# Patient Record
Sex: Male | Born: 1950 | Race: Black or African American | Hispanic: No | Marital: Married | Smoking: Never smoker
Health system: Southern US, Community
[De-identification: ages and names within clinical notes are randomized; demographics above are authoritative.]

## PROBLEM LIST (undated history)

## (undated) DIAGNOSIS — M1990 Unspecified osteoarthritis, unspecified site: Secondary | ICD-10-CM

## (undated) DIAGNOSIS — S83242A Other tear of medial meniscus, current injury, left knee, initial encounter: Secondary | ICD-10-CM

## (undated) DIAGNOSIS — I10 Essential (primary) hypertension: Secondary | ICD-10-CM

## (undated) DIAGNOSIS — K219 Gastro-esophageal reflux disease without esophagitis: Secondary | ICD-10-CM

## (undated) HISTORY — PX: COLONOSCOPY: SHX174

## (undated) HISTORY — PX: HIP SURGERY: SHX245

---

## 2008-12-14 ENCOUNTER — Emergency Department (HOSPITAL_COMMUNITY): Admission: EM | Admit: 2008-12-14 | Discharge: 2008-12-14 | Payer: Self-pay | Admitting: Emergency Medicine

## 2010-05-08 LAB — POCT I-STAT, CHEM 8
BUN: 13 mg/dL (ref 6–23)
Calcium, Ion: 1.18 mmol/L (ref 1.12–1.32)
Chloride: 101 mEq/L (ref 96–112)
Creatinine, Ser: 1.1 mg/dL (ref 0.4–1.5)
Glucose, Bld: 157 mg/dL — ABNORMAL HIGH (ref 70–99)
HCT: 45 % (ref 39.0–52.0)
Hemoglobin: 15.3 g/dL (ref 13.0–17.0)
Potassium: 3.9 mEq/L (ref 3.5–5.1)
Sodium: 140 mEq/L (ref 135–145)
TCO2: 28 mmol/L (ref 0–100)

## 2010-05-08 LAB — POCT CARDIAC MARKERS
CKMB, poc: 1.2 ng/mL (ref 1.0–8.0)
Myoglobin, poc: 231 ng/mL (ref 12–200)
Troponin i, poc: <0.05 ng/mL (ref 0.00–0.09)

## 2011-06-20 ENCOUNTER — Other Ambulatory Visit (HOSPITAL_COMMUNITY): Payer: Self-pay | Admitting: Gastroenterology

## 2011-06-20 ENCOUNTER — Encounter (HOSPITAL_COMMUNITY): Payer: Self-pay

## 2011-06-20 ENCOUNTER — Ambulatory Visit (HOSPITAL_COMMUNITY)
Admission: RE | Admit: 2011-06-20 | Discharge: 2011-06-20 | Disposition: A | Discharge status: Home / Self Care | Payer: BC Managed Care – PPO | Source: Ambulatory Visit | Attending: Gastroenterology | Admitting: Gastroenterology

## 2011-06-20 ENCOUNTER — Encounter (HOSPITAL_COMMUNITY): Payer: Self-pay | Admitting: Emergency Medicine

## 2011-06-20 ENCOUNTER — Inpatient Hospital Stay (HOSPITAL_COMMUNITY)
Admission: EM | Admit: 2011-06-20 | Discharge: 2011-06-25 | DRG: 183 | Disposition: A | Discharge status: Home / Self Care | Payer: BC Managed Care – PPO | Attending: Surgery | Admitting: Surgery

## 2011-06-20 DIAGNOSIS — R112 Nausea with vomiting, unspecified: Secondary | ICD-10-CM | POA: Diagnosis present

## 2011-06-20 DIAGNOSIS — K5730 Diverticulosis of large intestine without perforation or abscess without bleeding: Secondary | ICD-10-CM | POA: Insufficient documentation

## 2011-06-20 DIAGNOSIS — K5720 Diverticulitis of large intestine with perforation and abscess without bleeding: Secondary | ICD-10-CM | POA: Diagnosis present

## 2011-06-20 DIAGNOSIS — Z888 Allergy status to other drugs, medicaments and biological substances status: Secondary | ICD-10-CM

## 2011-06-20 DIAGNOSIS — K5792 Diverticulitis of intestine, part unspecified, without perforation or abscess without bleeding: Secondary | ICD-10-CM

## 2011-06-20 DIAGNOSIS — K5732 Diverticulitis of large intestine without perforation or abscess without bleeding: Principal | ICD-10-CM | POA: Diagnosis present

## 2011-06-20 LAB — BASIC METABOLIC PANEL
BUN: 13 mg/dL (ref 6–23)
CO2: 25 mEq/L (ref 19–32)
Calcium: 9.6 mg/dL (ref 8.4–10.5)
Chloride: 102 mEq/L (ref 96–112)
Creatinine, Ser: 1.07 mg/dL (ref 0.50–1.35)
GFR calc Af Amer: 87 mL/min — ABNORMAL LOW (ref >90)
GFR calc non Af Amer: 75 mL/min — ABNORMAL LOW (ref >90)
Glucose, Bld: 126 mg/dL — ABNORMAL HIGH (ref 70–99)
Potassium: 3.7 mEq/L (ref 3.5–5.1)
Sodium: 139 mEq/L (ref 135–145)

## 2011-06-20 LAB — CBC
HCT: 41.4 % (ref 39.0–52.0)
Hemoglobin: 14.5 g/dL (ref 13.0–17.0)
MCH: 32.9 pg (ref 26.0–34.0)
MCHC: 35 g/dL (ref 30.0–36.0)
MCV: 93.9 fL (ref 78.0–100.0)
Platelets: 183 10*3/uL (ref 150–400)
RBC: 4.41 MIL/uL (ref 4.22–5.81)
RDW: 12.8 % (ref 11.5–15.5)
WBC: 12.8 10*3/uL — ABNORMAL HIGH (ref 4.0–10.5)

## 2011-06-20 LAB — DIFFERENTIAL
Basophils Absolute: 0 10*3/uL (ref 0.0–0.1)
Basophils Relative: 0 % (ref 0–1)
Eosinophils Absolute: 0 10*3/uL (ref 0.0–0.7)
Eosinophils Relative: 0 % (ref 0–5)
Lymphocytes Relative: 13 % (ref 12–46)
Lymphs Abs: 1.6 10*3/uL (ref 0.7–4.0)
Monocytes Absolute: 0.9 10*3/uL (ref 0.1–1.0)
Monocytes Relative: 7 % (ref 3–12)
Neutro Abs: 10.3 10*3/uL — ABNORMAL HIGH (ref 1.7–7.7)
Neutrophils Relative %: 81 % — ABNORMAL HIGH (ref 43–77)

## 2011-06-20 LAB — MRSA PCR SCREENING: MRSA by PCR: NEGATIVE

## 2011-06-20 MED ORDER — METRONIDAZOLE IN NACL 5-0.79 MG/ML-% IV SOLN
500.0000 mg | Freq: Three times a day (TID) | INTRAVENOUS | Status: DC
  Administered 2011-06-20 – 2011-06-23 (×8): 500 mg via INTRAVENOUS
  Filled 2011-06-20 (×12): qty 100

## 2011-06-20 MED ORDER — IOHEXOL 300 MG/ML  SOLN
100.0000 mL | Freq: Once | INTRAMUSCULAR | Status: AC | PRN
  Administered 2011-06-20: 100 mL via INTRAVENOUS

## 2011-06-20 MED ORDER — PIPERACILLIN-TAZOBACTAM 3.375 G IVPB
3.3750 g | Freq: Three times a day (TID) | INTRAVENOUS | Status: DC
  Administered 2011-06-20 – 2011-06-24 (×11): 3.375 g via INTRAVENOUS
  Filled 2011-06-20 (×14): qty 50

## 2011-06-20 MED ORDER — METRONIDAZOLE IN NACL 5-0.79 MG/ML-% IV SOLN: 500.0000 mg | Freq: Three times a day (TID) | INTRAVENOUS | Status: DC

## 2011-06-20 MED ORDER — PANTOPRAZOLE SODIUM 40 MG IV SOLR
40.0000 mg | Freq: Every day | INTRAVENOUS | Status: DC
  Administered 2011-06-21 – 2011-06-24 (×5): 40 mg via INTRAVENOUS
  Filled 2011-06-20 (×7): qty 40

## 2011-06-20 MED ORDER — KCL IN DEXTROSE-NACL 20-5-0.9 MEQ/L-%-% IV SOLN
INTRAVENOUS | Status: DC
  Administered 2011-06-20 – 2011-06-25 (×9): via INTRAVENOUS
  Filled 2011-06-20 (×13): qty 1000

## 2011-06-20 MED ORDER — ONDANSETRON HCL 4 MG/2ML IJ SOLN: 4.0000 mg | Freq: Four times a day (QID) | INTRAMUSCULAR | Status: DC | PRN

## 2011-06-20 MED ORDER — MORPHINE SULFATE 2 MG/ML IJ SOLN: 2.0000 mg | INTRAMUSCULAR | Status: DC | PRN

## 2011-06-20 NOTE — ED Notes (Signed)
Pt c/o abdominal pain since last night after dinner.  HX of diverticulitis for 1 year.  Had outpatient CT today, which showed perforation.  Dr. Carolynne Edouard at bedside to admit pt for observation at this time.

## 2011-06-20 NOTE — ED Notes (Signed)
6-8 weeks having sharp pains in abd; last night went to dinner and later, having sharp pains right below belly button; had CT scan done- reports said that had diverticulitis; here to see Dr Carolynne Edouard

## 2011-06-20 NOTE — ED Notes (Signed)
Dr. Carolynne Edouard with pt in triage prior to going back to Pod A Room 11 (now).

## 2011-06-20 NOTE — ED Notes (Signed)
Informed Dr. Carolynne Edouard that pt. Arrived.  Orders received and he will be down to see pt.

## 2011-06-20 NOTE — H&P (Signed)
Matthew Yates is an 61 y.o. male.   Chief Complaint: abd pain HPI: 61 yo bm who presents with abd pain that started yesterday. Pain is worse in LLQ. He reports fever and weakness. He has had pains like this in the past but never quite this bad. No nausea or vomiting. He went to GI doc who got CT today that shows sigmoid diverticulitis with a couple dots of free air but no abscess.  History reviewed. No pertinent past medical history.  Past Surgical History  Procedure Date  . Hip surgery     History reviewed. No pertinent family history. Social History:  reports that he has never smoked. He does not have any smokeless tobacco history on file. He reports that he does not drink alcohol or use illicit drugs.  Allergies:  Allergies  Allergen Reactions  . Other Other (See Comments)    Pt states that a certain anesthesia for dental work caused his HR to lower drastically. Cannot remember name but understands that it is not a commonly used choice (i.e., novocain).      (Not in a hospital admission)  Results for orders placed during the hospital encounter of 06/20/11 (from the past 48 hour(s))  CBC     Status: Abnormal   Collection Time   06/20/11  6:33 PM      Component Value Range Comment   WBC 12.8 (*) 4.0 - 10.5 (K/uL)    RBC 4.41  4.22 - 5.81 (MIL/uL)    Hemoglobin 14.5  13.0 - 17.0 (g/dL)    HCT 21.3  08.6 - 57.8 (%)    MCV 93.9  78.0 - 100.0 (fL)    MCH 32.9  26.0 - 34.0 (pg)    MCHC 35.0  30.0 - 36.0 (g/dL)    RDW 46.9  62.9 - 52.8 (%)    Platelets 183  150 - 400 (K/uL)   DIFFERENTIAL     Status: Abnormal   Collection Time   06/20/11  6:33 PM      Component Value Range Comment   Neutrophils Relative 81 (*) 43 - 77 (%)    Neutro Abs 10.3 (*) 1.7 - 7.7 (K/uL)    Lymphocytes Relative 13  12 - 46 (%)    Lymphs Abs 1.6  0.7 - 4.0 (K/uL)    Monocytes Relative 7  3 - 12 (%)    Monocytes Absolute 0.9  0.1 - 1.0 (K/uL)    Eosinophils Relative 0  0 - 5 (%)    Eosinophils  Absolute 0.0  0.0 - 0.7 (K/uL)    Basophils Relative 0  0 - 1 (%)    Basophils Absolute 0.0  0.0 - 0.1 (K/uL)   BASIC METABOLIC PANEL     Status: Abnormal   Collection Time   06/20/11  6:33 PM      Component Value Range Comment   Sodium 139  135 - 145 (mEq/L)    Potassium 3.7  3.5 - 5.1 (mEq/L)    Chloride 102  96 - 112 (mEq/L)    CO2 25  19 - 32 (mEq/L)    Glucose, Bld 126 (*) 70 - 99 (mg/dL)    BUN 13  6 - 23 (mg/dL)    Creatinine, Ser 4.13  0.50 - 1.35 (mg/dL)    Calcium 9.6  8.4 - 10.5 (mg/dL)    GFR calc non Af Amer 75 (*) >90 (mL/min)    GFR calc Af Amer 87 (*) >90 (mL/min)  Ct Abdomen Pelvis W Contrast  06/20/2011  **ADDENDUM** CREATED: 06/20/2011 16:22:22  These results were called by telephone on 06/20/2011  at  1623 hours to  Dr. Herbert Moors, who verbally acknowledged these results.  **END ADDENDUM** SIGNED BY: Charline Bills, M.D.   06/20/2011  *RADIOLOGY REPORT*  Clinical Data: Chronic recurrent lower abdominal pain, nausea/vomiting  CT ABDOMEN AND PELVIS WITH CONTRAST  Technique:  Multidetector CT imaging of the abdomen and pelvis was performed following the standard protocol during bolus administration of intravenous contrast.  Contrast: OMNIPAQUE IOHEXOL 300 MG/ML  SOLN  Comparison: None.  Findings: Subpleural reticulation/dependent atelectasis at the lung bases.  Liver, spleen, pancreas, and adrenal glands are within normal limits.  Gallbladder is unremarkable.  No intrahepatic or extrahepatic ductal dilatation.  Kidneys are within normal limits.  No hydronephrosis.  No evidence of bowel obstruction.  Normal appendix.  Inflammatory changes involving a loop of sigmoid colon (series 2/image 76), reflecting sigmoid diverticulitis.  Multiple tiny foci of localized gas (for example, series 2/image 74) as well as free air (for example, series 2/images 11 and 20), reflecting perforation.  Adjacent 2.0 x 1.4 cm thin-walled pericolonic gas collection is favored to reflect either  the inflamed diverticulum or a localized perforation.  Regardless, currently there is no drainable fluid collection or abscess.  No evidence of abdominal aortic aneurysm.  No abdominopelvic ascites.  No suspicious abdominopelvic lymphadenopathy.  Prostate is unremarkable.  Bladder is within normal limits.  Moderate fat-containing periumbilical hernia.  Mild degenerative changes of the visualized thoracolumbar spine.  IMPRESSION: Complicated sigmoid diverticulitis with scattered small foci of localized gas and free air, as described above, reflecting perforation.  Currently, there is no drainable fluid collection or abscess.  If surgical intervention is not performed, close clinical follow-up is suggested with repeat imaging as clinically warranted.  Original Report Authenticated By: Charline Bills, M.D.    Review of Systems  Constitutional: Positive for fever.  HENT: Negative.   Eyes: Negative.   Respiratory: Negative.   Cardiovascular: Negative.   Gastrointestinal: Positive for abdominal pain.  Genitourinary: Negative.   Musculoskeletal: Negative.   Skin: Negative.   Neurological: Positive for weakness.  Endo/Heme/Allergies: Negative.   Psychiatric/Behavioral: Negative.     Blood pressure 109/72, pulse 116, temperature 100.5 F (38.1 C), temperature source Oral, resp. rate 16, SpO2 98.00%. Physical Exam  Constitutional: He is oriented to person, place, and time. He appears well-developed and well-nourished.  HENT:  Head: Normocephalic and atraumatic.  Eyes: Conjunctivae and EOM are normal. Pupils are equal, round, and reactive to light.  Neck: Normal range of motion. Neck supple.  Cardiovascular: Normal rate, regular rhythm and normal heart sounds.   Respiratory: Effort normal and breath sounds normal.  GI:       The pt has moderate diffuse tenderness but does not appear to have peritonitis  Musculoskeletal: Normal range of motion.  Neurological: He is alert and oriented to person,  place, and time.  Skin: Skin is warm and dry.  Psychiatric: He has a normal mood and affect. His behavior is normal.     Assessment/Plan Complicated sigmoid diverticulitis without peritonitis currently. We will plan to admit him for bowel rest, IV hydration, and broad spectrum abx coverage. We will check him frequently. If he does not improve then he may need to go to surgery for colectomy and colostomy. If he improves then we may be able get him better and plan for elective colectomy in 6-8 weeks. He understands and agrees with  treatment plan.  TOTH III,Addilynn Mowrer S 06/20/2011, 7:23 PM

## 2011-06-21 LAB — DIFFERENTIAL
Basophils Absolute: 0 10*3/uL (ref 0.0–0.1)
Basophils Relative: 0 % (ref 0–1)
Eosinophils Absolute: 0 10*3/uL (ref 0.0–0.7)
Eosinophils Relative: 0 % (ref 0–5)
Lymphocytes Relative: 22 % (ref 12–46)
Lymphs Abs: 2.5 10*3/uL (ref 0.7–4.0)
Monocytes Absolute: 0.7 10*3/uL (ref 0.1–1.0)
Monocytes Relative: 6 % (ref 3–12)
Neutro Abs: 8.3 10*3/uL — ABNORMAL HIGH (ref 1.7–7.7)
Neutrophils Relative %: 72 % (ref 43–77)

## 2011-06-21 LAB — BASIC METABOLIC PANEL
BUN: 13 mg/dL (ref 6–23)
CO2: 25 mEq/L (ref 19–32)
Calcium: 9.2 mg/dL (ref 8.4–10.5)
Chloride: 103 mEq/L (ref 96–112)
Creatinine, Ser: 1.22 mg/dL (ref 0.50–1.35)
GFR calc Af Amer: 74 mL/min — ABNORMAL LOW (ref >90)
GFR calc non Af Amer: 64 mL/min — ABNORMAL LOW (ref >90)
Glucose, Bld: 120 mg/dL — ABNORMAL HIGH (ref 70–99)
Potassium: 3.7 mEq/L (ref 3.5–5.1)
Sodium: 139 mEq/L (ref 135–145)

## 2011-06-21 LAB — CBC
HCT: 39.5 % (ref 39.0–52.0)
Hemoglobin: 13.3 g/dL (ref 13.0–17.0)
MCH: 32.2 pg (ref 26.0–34.0)
MCHC: 33.7 g/dL (ref 30.0–36.0)
MCV: 95.6 fL (ref 78.0–100.0)
Platelets: 173 10*3/uL (ref 150–400)
RBC: 4.13 MIL/uL — ABNORMAL LOW (ref 4.22–5.81)
RDW: 13.1 % (ref 11.5–15.5)
WBC: 11.6 10*3/uL — ABNORMAL HIGH (ref 4.0–10.5)

## 2011-06-21 MED ORDER — SODIUM CHLORIDE 0.9 % IJ SOLN
INTRAMUSCULAR | Status: AC
  Administered 2011-06-21: 10 mL
  Filled 2011-06-21: qty 10

## 2011-06-21 NOTE — Progress Notes (Signed)
Subjective: Pain improved, but not gone.  No chills.  Objective: Vital signs in last 24 hours: Temp:  [98.3 F (36.8 C)-100.5 F (38.1 C)] 98.3 F (36.8 C) (05/18 0700) Pulse Rate:  [67-116] 67  (05/18 0700) Resp:  [15-24] 16  (05/18 0700) BP: (95-128)/(52-79) 110/60 mmHg (05/18 0700) SpO2:  [91 %-98 %] 94 % (05/18 0700) Weight:  [258 lb 9.6 oz (117.3 kg)] 258 lb 9.6 oz (117.3 kg) (05/17 2058)    Intake/Output from previous day: 05/17 0701 - 05/18 0700 In: 50 [IV Piggyback:50] Out: 750 [Urine:750] Intake/Output this shift: Total I/O In: 100 [IV Piggyback:100] Out: -   General appearance: alert, cooperative and no distress GI: soft, mild distention, mild tenderness LLQ  Lab Results:   Basename 06/21/11 0402 06/20/11 1833  WBC 11.6* 12.8*  HGB 13.3 14.5  HCT 39.5 41.4  PLT 173 183   BMET  Basename 06/21/11 0402 06/20/11 1833  NA 139 139  K 3.7 3.7  CL 103 102  CO2 25 25  GLUCOSE 120* 126*  BUN 13 13  CREATININE 1.22 1.07  CALCIUM 9.2 9.6   PT/INR No results found for this basename: LABPROT:2,INR:2 in the last 72 hours ABG No results found for this basename: PHART:2,PCO2:2,PO2:2,HCO3:2 in the last 72 hours  Studies/Results: Ct Abdomen Pelvis W Contrast  06/20/2011  **ADDENDUM** CREATED: 06/20/2011 16:22:22  These results were called by telephone on 06/20/2011  at  1623 hours to  Dr. Herbert Moors, who verbally acknowledged these results.  **END ADDENDUM** SIGNED BY: Charline Bills, M.D.   06/20/2011  *RADIOLOGY REPORT*  Clinical Data: Chronic recurrent lower abdominal pain, nausea/vomiting  CT ABDOMEN AND PELVIS WITH CONTRAST  Technique:  Multidetector CT imaging of the abdomen and pelvis was performed following the standard protocol during bolus administration of intravenous contrast.  Contrast: OMNIPAQUE IOHEXOL 300 MG/ML  SOLN  Comparison: None.  Findings: Subpleural reticulation/dependent atelectasis at the lung bases.  Liver, spleen, pancreas, and  adrenal glands are within normal limits.  Gallbladder is unremarkable.  No intrahepatic or extrahepatic ductal dilatation.  Kidneys are within normal limits.  No hydronephrosis.  No evidence of bowel obstruction.  Normal appendix.  Inflammatory changes involving a loop of sigmoid colon (series 2/image 76), reflecting sigmoid diverticulitis.  Multiple tiny foci of localized gas (for example, series 2/image 74) as well as free air (for example, series 2/images 11 and 20), reflecting perforation.  Adjacent 2.0 x 1.4 cm thin-walled pericolonic gas collection is favored to reflect either the inflamed diverticulum or a localized perforation.  Regardless, currently there is no drainable fluid collection or abscess.  No evidence of abdominal aortic aneurysm.  No abdominopelvic ascites.  No suspicious abdominopelvic lymphadenopathy.  Prostate is unremarkable.  Bladder is within normal limits.  Moderate fat-containing periumbilical hernia.  Mild degenerative changes of the visualized thoracolumbar spine.  IMPRESSION: Complicated sigmoid diverticulitis with scattered small foci of localized gas and free air, as described above, reflecting perforation.  Currently, there is no drainable fluid collection or abscess.  If surgical intervention is not performed, close clinical follow-up is suggested with repeat imaging as clinically warranted.  Original Report Authenticated By: Charline Bills, M.D.    Anti-infectives: Anti-infectives     Start     Dose/Rate Route Frequency Ordered Stop   06/20/11 2000   piperacillin-tazobactam (ZOSYN) IVPB 3.375 g        3.375 g 12.5 mL/hr over 240 Minutes Intravenous Every 8 hours 06/20/11 1936     06/20/11 1945  metroNIDAZOLE (FLAGYL) IVPB 500 mg        500 mg 100 mL/hr over 60 Minutes Intravenous Every 8 hours 06/20/11 1937     06/20/11 1945   metroNIDAZOLE (FLAGYL) IVPB 500 mg  Status:  Discontinued        500 mg 100 mL/hr over 60 Minutes Intravenous Every 8 hours 06/20/11  1936 06/20/11 1951          Assessment/Plan: s/p * No surgery found * Continue broad spectrum antibiotics Sigmoid diverticulitis with microperforation. Transfer to floor. NPO x ice chips. Pain control GI prophylaxis Hope to be able to do clears tomorrow if pain free.   Goal is to improve with non operative management and not do surgical resection.  LOS: 1 day    [facility] 06/21/2011

## 2011-06-21 NOTE — ED Provider Notes (Signed)
Medical Screening Note.  This generally well-appearing male with a history of diverticulitis, but no prior surgical repair, now presents at the behest of the surgical team, do to worsening abdominal pain.  The patient discussed his care with the surgeons and was advised to present to the emergency department where he would be met by them.  On the patient's arrival Dr. Carolynne Edouard is already present.  The patient's VS are stable, he is in no distress, he has a mildly tender, non-peritoneal abdomen, and he is speaking clearly.  Monitors: Cardiac: 110 -st abnormal  Pulse ox 99% ra normal  The patient's care was assumed by Dr. Carolynne Edouard, as he was admitted to his service directly.  Gerhard Munch, MD 06/21/11 1134

## 2011-06-22 LAB — CBC
HCT: 36.8 % — ABNORMAL LOW (ref 39.0–52.0)
Hemoglobin: 12.8 g/dL — ABNORMAL LOW (ref 13.0–17.0)
MCH: 33.1 pg (ref 26.0–34.0)
MCHC: 34.8 g/dL (ref 30.0–36.0)
MCV: 95.1 fL (ref 78.0–100.0)
Platelets: 172 10*3/uL (ref 150–400)
RBC: 3.87 MIL/uL — ABNORMAL LOW (ref 4.22–5.81)
RDW: 12.7 % (ref 11.5–15.5)
WBC: 7.1 10*3/uL (ref 4.0–10.5)

## 2011-06-22 LAB — BASIC METABOLIC PANEL
BUN: 14 mg/dL (ref 6–23)
CO2: 24 mEq/L (ref 19–32)
Calcium: 8.9 mg/dL (ref 8.4–10.5)
Chloride: 103 mEq/L (ref 96–112)
Creatinine, Ser: 1.28 mg/dL (ref 0.50–1.35)
GFR calc Af Amer: 70 mL/min — ABNORMAL LOW (ref >90)
GFR calc non Af Amer: 60 mL/min — ABNORMAL LOW (ref >90)
Glucose, Bld: 105 mg/dL — ABNORMAL HIGH (ref 70–99)
Potassium: 3.7 mEq/L (ref 3.5–5.1)
Sodium: 139 mEq/L (ref 135–145)

## 2011-06-22 MED ORDER — WHITE PETROLATUM GEL
Status: AC
  Administered 2011-06-22: 15:00:00
  Filled 2011-06-22: qty 5

## 2011-06-22 MED ORDER — LORATADINE 10 MG PO TABS
10.0000 mg | ORAL_TABLET | Freq: Every day | ORAL | Status: DC
  Administered 2011-06-22 – 2011-06-25 (×4): 10 mg via ORAL
  Filled 2011-06-22 (×4): qty 1

## 2011-06-22 MED ORDER — HEPARIN SODIUM (PORCINE) 5000 UNIT/ML IJ SOLN
5000.0000 [IU] | Freq: Three times a day (TID) | INTRAMUSCULAR | Status: DC
  Administered 2011-06-22 – 2011-06-25 (×9): 5000 [IU] via SUBCUTANEOUS
  Filled 2011-06-22 (×12): qty 1

## 2011-06-22 NOTE — Progress Notes (Signed)
Diverticulitis of large intestine with perforation  Subjective: He feels better today. Minimal pain. Wants to eat. Passing gas. No nausea. Has been ambulating some.  Objective: Vital signs in last 24 hours: Temp:  [98.2 F (36.8 C)-99.3 F (37.4 C)] 98.9 F (37.2 C) (05/19 2130) Pulse Rate:  [74-85] 85  (05/19 0633) Resp:  [18-20] 18  (05/19 8657) BP: (109-128)/(63-73) 109/65 mmHg (05/19 0633) SpO2:  [96 %-99 %] 96 % (05/19 8469) Last BM Date: 06/19/11  Intake/Output from previous day: 05/18 0701 - 05/19 0700 In: 512.5 [I.V.:400; IV Piggyback:112.5] Out: 900 [Urine:900] Intake/Output this shift: Total I/O In: -  Out: 400 [Urine:400]  General appearance: alert, cooperative and no distress Resp: clear to auscultation bilaterally Cardio: regular rate and rhythm, S1, S2 normal, no murmur, click, rub or gallop GI: ssoft with very minimal left lower quadrant tenderness. The remainder the abdomen is completely nontender. There are no masses. There is no rebound. Bowel sounds are normal.  Lab Results:  Results for orders placed during the hospital encounter of 06/20/11 (from the past 24 hour(s))  CBC     Status: Abnormal   Collection Time   06/22/11  5:50 AM      Component Value Range   WBC 7.1  4.0 - 10.5 (K/uL)   RBC 3.87 (*) 4.22 - 5.81 (MIL/uL)   Hemoglobin 12.8 (*) 13.0 - 17.0 (g/dL)   HCT 62.9 (*) 52.8 - 52.0 (%)   MCV 95.1  78.0 - 100.0 (fL)   MCH 33.1  26.0 - 34.0 (pg)   MCHC 34.8  30.0 - 36.0 (g/dL)   RDW 41.3  24.4 - 01.0 (%)   Platelets 172  150 - 400 (K/uL)  BASIC METABOLIC PANEL     Status: Abnormal   Collection Time   06/22/11  5:50 AM      Component Value Range   Sodium 139  135 - 145 (mEq/L)   Potassium 3.7  3.5 - 5.1 (mEq/L)   Chloride 103  96 - 112 (mEq/L)   CO2 24  19 - 32 (mEq/L)   Glucose, Bld 105 (*) 70 - 99 (mg/dL)   BUN 14  6 - 23 (mg/dL)   Creatinine, Ser 2.72  0.50 - 1.35 (mg/dL)   Calcium 8.9  8.4 - 53.6 (mg/dL)   GFR calc non Af Amer 60 (*)  >90 (mL/min)   GFR calc Af Amer 70 (*) >90 (mL/min)     Studies/Results Radiology     MEDS, Scheduled    . metronidazole  500 mg Intravenous Q8H  . pantoprazole (PROTONIX) IV  40 mg Intravenous QHS  . piperacillin-tazobactam (ZOSYN)  IV  3.375 g Intravenous Q8H     Assessment: Diverticulitis of large intestine with perforation Clinically improved  Plan: Will allow sips of clear liquids today. Plan to recheck labs in the morning. I suspect he'll need to be on intravenous antibiotics at least 5 days and a repeat CT scan in about 3 days. I reviewed these plans with him.  LOS: 2 days    Currie Paris, MD, [facility], [place] [phone]   06/22/2011 8:36 AM

## 2011-06-23 LAB — BASIC METABOLIC PANEL
BUN: 9 mg/dL (ref 6–23)
CO2: 25 mEq/L (ref 19–32)
Calcium: 8.7 mg/dL (ref 8.4–10.5)
Chloride: 105 mEq/L (ref 96–112)
Creatinine, Ser: 1.23 mg/dL (ref 0.50–1.35)
GFR calc Af Amer: 73 mL/min — ABNORMAL LOW (ref >90)
GFR calc non Af Amer: 63 mL/min — ABNORMAL LOW (ref >90)
Glucose, Bld: 102 mg/dL — ABNORMAL HIGH (ref 70–99)
Potassium: 3.5 mEq/L (ref 3.5–5.1)
Sodium: 139 mEq/L (ref 135–145)

## 2011-06-23 LAB — CBC
HCT: 36.5 % — ABNORMAL LOW (ref 39.0–52.0)
Hemoglobin: 12.5 g/dL — ABNORMAL LOW (ref 13.0–17.0)
MCH: 32.6 pg (ref 26.0–34.0)
MCHC: 34.2 g/dL (ref 30.0–36.0)
MCV: 95.3 fL (ref 78.0–100.0)
Platelets: 172 10*3/uL (ref 150–400)
RBC: 3.83 MIL/uL — ABNORMAL LOW (ref 4.22–5.81)
RDW: 12.8 % (ref 11.5–15.5)
WBC: 4.7 10*3/uL (ref 4.0–10.5)

## 2011-06-23 NOTE — Care Management Note (Signed)
  Page 1 of 1   06/23/2011     2:46:37 PM   CARE MANAGEMENT NOTE 06/23/2011  Patient:  Matthew Yates, Matthew Yates   Account Number:  [id number]  Date Initiated:  06/23/2011  Documentation initiated by:  Ronny Flurry  Subjective/Objective Assessment:   DX: Sigmoid diverticulitis with microperforation     Action/Plan:   Goal is to improve with non operative management and not do surgical resection   Anticipated DC Date:  06/27/2011   Anticipated DC Plan:  HOME/SELF CARE         Choice offered to / List presented to:             Status of service:  In process, will continue to follow Medicare Important Message given?   (If response is "NO", the following Medicare IM given date fields will be blank) Date Medicare IM given:   Date Additional Medicare IM given:    Discharge Disposition:    Per UR Regulation:  Reviewed for med. necessity/level of care/duration of stay  If discussed at Long Length of Stay Meetings, dates discussed:    Comments:

## 2011-06-23 NOTE — Progress Notes (Signed)
Patient ID: Matthew Yates, male   DOB: 1950-12-02, 61 y.o.   MRN: [id number]    Subjective: Pt was feeling better until a few minutes ago he started having some increase in pain and nausea.  He hasn't eaten his clear liquids yet.  He had 2 BMs yesterday.  Objective: Vital signs in last 24 hours: Temp:  [98.3 F (36.8 C)-100.2 F (37.9 C)] 98.3 F (36.8 C) (05/20 0526) Pulse Rate:  [68-88] 68  (05/20 0526) Resp:  [18] 18  (05/20 0526) BP: (104-129)/(52-82) 118/74 mmHg (05/20 0526) SpO2:  [95 %-100 %] 95 % (05/20 0526) Last BM Date: 06/19/11  Intake/Output from previous day: 05/19 0701 - 05/20 0700 In: 1144 [P.O.:280; I.V.:864] Out: 600 [Urine:600] Intake/Output this shift:    PE: Abd: soft, minimally tender, greatest in suprapubic region, +BS, ND Heart: regular Lungs: CTAB  Lab Results:   Basename 06/23/11 0604 06/22/11 0550  WBC 4.7 7.1  HGB 12.5* 12.8*  HCT 36.5* 36.8*  PLT 172 172   BMET  Basename 06/23/11 0604 06/22/11 0550  NA 139 139  K 3.5 3.7  CL 105 103  CO2 25 24  GLUCOSE 102* 105*  BUN 9 14  CREATININE 1.23 1.28  CALCIUM 8.7 8.9   PT/INR No results found for this basename: LABPROT:2,INR:2 in the last 72 hours CMP     Component Value Date/Time   NA 139 06/23/2011 0604   K 3.5 06/23/2011 0604   CL 105 06/23/2011 0604   CO2 25 06/23/2011 0604   GLUCOSE 102* 06/23/2011 0604   BUN 9 06/23/2011 0604   CREATININE 1.23 06/23/2011 0604   CALCIUM 8.7 06/23/2011 0604   GFRNONAA 63* 06/23/2011 0604   GFRAA 73* 06/23/2011 0604   Lipase  No results found for this basename: lipase       Studies/Results: No results found.  Anti-infectives: Anti-infectives     Start     Dose/Rate Route Frequency Ordered Stop   06/20/11 2000  piperacillin-tazobactam (ZOSYN) IVPB 3.375 g       3.375 g 12.5 mL/hr over 240 Minutes Intravenous Every 8 hours 06/20/11 1936     06/20/11 1945   metroNIDAZOLE (FLAGYL) IVPB 500 mg        500 mg 100 mL/hr over 60 Minutes  Intravenous Every 8 hours 06/20/11 1937     06/20/11 1945   metroNIDAZOLE (FLAGYL) IVPB 500 mg  Status:  Discontinued        500 mg 100 mL/hr over 60 Minutes Intravenous Every 8 hours 06/20/11 1936 06/20/11 1951           Assessment/Plan  1. Diverticulitis with microperforation  Plan: 1. Cont IV ABX therapy. 2. Cont clears for now.  If pain and nausea worsen will back off to NPO, however, patient's pain seems relatively minimal.  Will follow.   LOS: 3 days    Kelita Wallis E 06/23/2011

## 2011-06-24 MED ORDER — METRONIDAZOLE 500 MG PO TABS
500.0000 mg | ORAL_TABLET | Freq: Three times a day (TID) | ORAL | Status: DC
  Administered 2011-06-24 – 2011-06-25 (×4): 500 mg via ORAL
  Filled 2011-06-24 (×6): qty 1

## 2011-06-24 MED ORDER — CIPROFLOXACIN HCL 500 MG PO TABS
500.0000 mg | ORAL_TABLET | Freq: Two times a day (BID) | ORAL | Status: DC
  Administered 2011-06-24 – 2011-06-25 (×3): 500 mg via ORAL
  Filled 2011-06-24 (×5): qty 1

## 2011-06-24 NOTE — Progress Notes (Signed)
Patient ID: Matthew Yates, male   DOB: Jan 30, 1951, 61 y.o.   MRN: [id number]    Subjective: Pt feeling much better today.  No pain.  Tolerating clear liquids with no problems.  Objective: Vital signs in last 24 hours: Temp:  [98.1 F (36.7 C)-98.9 F (37.2 C)] 98.9 F (37.2 C) (05/21 0556) Pulse Rate:  [56-68] 56  (05/21 0556) Resp:  [18-20] 18  (05/21 0556) BP: (132-139)/(67-81) 139/77 mmHg (05/21 0556) SpO2:  [98 %-99 %] 99 % (05/21 0556) Last BM Date: 06/22/11  Intake/Output from previous day: 05/20 0701 - 05/21 0700 In: 2470 [P.O.:120; I.V.:2300; IV Piggyback:50] Out: 1000 [Urine:1000] Intake/Output this shift:    PE: Abd: soft, NT, ND, +BS Heart: regular Lungs: CTAB  Lab Results:   Basename 06/23/11 0604 06/22/11 0550  WBC 4.7 7.1  HGB 12.5* 12.8*  HCT 36.5* 36.8*  PLT 172 172   BMET  Basename 06/23/11 0604 06/22/11 0550  NA 139 139  K 3.5 3.7  CL 105 103  CO2 25 24  GLUCOSE 102* 105*  BUN 9 14  CREATININE 1.23 1.28  CALCIUM 8.7 8.9   PT/INR No results found for this basename: LABPROT:2,INR:2 in the last 72 hours CMP     Component Value Date/Time   NA 139 06/23/2011 0604   K 3.5 06/23/2011 0604   CL 105 06/23/2011 0604   CO2 25 06/23/2011 0604   GLUCOSE 102* 06/23/2011 0604   BUN 9 06/23/2011 0604   CREATININE 1.23 06/23/2011 0604   CALCIUM 8.7 06/23/2011 0604   GFRNONAA 63* 06/23/2011 0604   GFRAA 73* 06/23/2011 0604   Lipase  No results found for this basename: lipase       Studies/Results: No results found.  Anti-infectives: Anti-infectives     Start     Dose/Rate Route Frequency Ordered Stop   06/20/11 2000  piperacillin-tazobactam (ZOSYN) IVPB 3.375 g       3.375 g 12.5 mL/hr over 240 Minutes Intravenous Every 8 hours 06/20/11 1936     06/20/11 1945   metroNIDAZOLE (FLAGYL) IVPB 500 mg  Status:  Discontinued        500 mg 100 mL/hr over 60 Minutes Intravenous Every 8 hours 06/20/11 1937 06/23/11 0902   06/20/11 1945    metroNIDAZOLE (FLAGYL) IVPB 500 mg  Status:  Discontinued        500 mg 100 mL/hr over 60 Minutes Intravenous Every 8 hours 06/20/11 1936 06/20/11 1951           Assessment/Plan  1. Diverticulitis with microperforation  Plan: 1. Advance diet 2. Change abx therapy to po  3. Hopefully will be ready for dc within the next 1-2 days. 4. Will not plan on repeat CT scan unless patient worsens   LOS: 4 days    Matthew Yates E 06/24/2011

## 2011-06-25 MED ORDER — CIPROFLOXACIN HCL 500 MG PO TABS: 500.0000 mg | ORAL_TABLET | Freq: Two times a day (BID) | ORAL | Status: AC

## 2011-06-25 MED ORDER — METRONIDAZOLE 500 MG PO TABS: 500.0000 mg | ORAL_TABLET | Freq: Three times a day (TID) | ORAL | Status: AC

## 2011-06-25 MED ORDER — OXYCODONE-ACETAMINOPHEN 5-325 MG PO TABS: 1.0000 | ORAL_TABLET | ORAL | Status: AC | PRN

## 2011-06-25 NOTE — Discharge Instructions (Signed)
Low Fiber and Residue Restricted Diet A low fiber diet restricts foods that contain carbohydrates that are not digested in the small intestine. A diet containing about 10 g of fiber is considered low fiber. The diet needs to be individualized to suit patient tolerances and preferences and to avoid unnecessary restrictions. Generally, the foods emphasized in a low fiber diet have no skins or seeds. They may have been processed to remove bran, germ, or husks. Cooking may not necessarily eliminate the fiber. Cooking may, in fact, enable a greater quantity of fiber to be consumed in a lesser volume. Legumes and nuts are also restricted. The term low residue has also been used to describe low fiber diets, although the two are not the same. Residue refers to any substance that adds to bowel (colonic) contents, such as sloughed cells and intestinal bacteria, in addition to fiber. Residue-containing foods, prunes and prune juice, milk, and connective tissue from meats may also need to be eliminated. It is important to eliminate these foods during sudden (acute) attacks of inflammatory bowel disease, when there is a partial obstruction due to another reason, or when minimal fecal output is desired. When these problems are gone, a more normal diet may be used. PURPOSE  Prevent blockage of a partially obstructed or narrowed gastrointestinal tract.   Reduce stool weight and volume.   Slow the movement of waste.  WHEN IS THIS DIET USED?  Acute phase of Crohn's disease, ulcerative colitis, regional enteritis, or diverticulitis.   Narrowing (stenosis) of intestinal or esophageal tubes (lumina).   Transitional diet following surgery, injury (trauma), or illness.  ADEQUACY This diet is nutritionally adequate based on individual food choices according to the Recommended Dietary Allowances of the National Research Council. CHOOSING FOODS Check labels, especially on foods from the starch list. Often, dietary fiber  content is listed with the Nutrition Facts panel.  Breads and Starches  Allowed: White, French, and pita breads, plain rolls, buns, or sweet rolls, doughnuts, waffles, pancakes, bagels. Plain muffins, sweet breads, biscuits, matzoth. Flour. Soda, saltine, or graham crackers. Pretzels, rusks, melba toast, zwieback. Cooked cereals: cornmeal, farina, cream cereals. Dry cereals: refined corn, wheat, rice, and oat cereals (check label). Potatoes prepared any way without skins, refined macaroni, spaghetti, noodles, refined rice.   Avoid: Bread, rolls, or crackers made with whole-wheat, multigrains, rye, bran seeds, nuts, or coconut. Corn tortillas, table-shells. Corn chips, tortilla chips. Cereals containing whole-grains, multigrains, bran, coconut, nuts, or raisins. Cooked or dry oatmeal. Coarse wheat cereals, granola. Cereals advertised as "high fiber." Potato skins. Whole-grain pasta, wild or brown rice. Popcorn.  Vegetables  Allowed:  Strained tomato and vegetable juices. Fresh: tender lettuce, cucumber, cabbage, spinach, bean sprouts. Cooked, canned: asparagus, bean sprouts, cut green or wax beans, cauliflower, pumpkin, beets, mushrooms, olives, spinach, yellow squash, tomato, tomato sauce (no seeds), zucchini (peeled), turnips. Canned sweet potatoes. Small amounts of celery, onion, radish, and green pepper may be used. Keep servings limited to  cup.   Avoid: Fresh, cooked, or canned: artichokes, baked beans, beet greens, broccoli, Brussels sprouts, French-style green beans, corn, kale, legumes, peas, sweet potatoes. Cooked: green or red cabbage, spinach. Avoid large servings of any vegetables.  Fruit  Allowed:  All fruit juices except prune juice. Cooked or canned: apricots applesauce, cantaloupe, cherries, grapefruit, grapes, kiwi, mandarin oranges, peaches, pears, fruit cocktail, pineapple, plums, watermelon. Fresh: banana, grapes, cantaloupe, avocado, cherries, pineapple, grapefruit, kiwi,  nectarines, peaches, oranges, blueberries, plums. Keep servings limited to  cup or 1 piece.     Avoid: Fresh: apple with or without skin, apricots, mango, pears, raspberries, strawberries. Prune juice, stewed or dried prunes. Dried fruits, raisins, dates. Avoid large servings of all fresh fruits.  Meat and Meat Substitutes  Allowed:  Ground or well-cooked tender beef, ham, veal, lamb, pork, or poultry. Eggs, plain cheese. Fish, oysters, shrimp, lobster, other seafood. Liver, organ meats.   Avoid: Tough, fibrous meats with gristle. Peanut butter, smooth or chunky. Cheese with seeds, nuts, or other foods not allowed. Nuts, seeds, legumes, dried peas, beans, lentils.  Milk  Allowed:  All milk products except those not allowed. Milk and milk product consumption should be minimal when low residue is desired.   Avoid: Yogurt that contains nuts or seeds.  Soups and Combination Foods  Allowed:  Bouillon, broth, or cream soups made from allowed foods. Any strained soup. Casseroles or mixed dishes made with allowed foods.   Avoid: Soups made from vegetables that are not allowed or that contain other foods not allowed.  Desserts and Sweets  Allowed:  Plain cakes and cookies, pie made with allowed fruit, pudding, custard, cream pie. Gelatin, fruit, ice, sherbet, frozen ice pops. Ice cream, ice milk without nuts. Plain hard candy, honey, jelly, molasses, syrup, sugar, chocolate syrup, gumdrops, marshmallows.   Avoid: Desserts, cookies, or candies that contain nuts, peanut butter, or dried fruits. Jams, preserves with seeds, marmalade.  Fats and Oils  Allowed:  Margarine, butter, cream, mayonnaise, salad oils, plain salad dressings made from allowed foods. Plain gravy, crisp bacon without rind.   Avoid: Seeds, nuts, olives. Avocados.  Beverages  Allowed:  All, except those listed to avoid.   Avoid: Fruit juices with high pulp, prune juice.  Condiments  Allowed:  Ketchup, mustard, horseradish,  vinegar, cream sauce, cheese sauce, cocoa powder. Spices in moderation: allspice, basil, bay leaves, celery powder or leaves, cinnamon, cumin powder, curry powder, ginger, mace, marjoram, onion or garlic powder, oregano, paprika, parsley flakes, ground pepper, rosemary, sage, savory, tarragon, thyme, turmeric.   Avoid: Coconut, pickles.  SAMPLE MEAL PLAN The following menu is provided as a sample. Your daily menu plans will vary. Be sure to include a minimum of the following each day in order to provide essential nutrients for the adult:  Starch/Bread/Cereal Group, 6 servings.   Fruit/Vegetable Group, 5 servings.   Meat/Meat Substitute Group, 2 servings.   Milk/Milk Substitute Group, 2 servings.  A serving is equal to  cup for fruits, vegetables, and cooked cereals or 1 piece for foods such as a piece of bread, 1 orange, or 1 apple. For dry cereals and crackers, use serving sizes listed on the label. Combination foods may count as full or partial servings from various food groups. Fats, desserts, and sweets may be added to the meal plan after the requirements for essential nutrients are met. SAMPLE MENU Breakfast   cup orange juice.   1 boiled egg.   1 slice white toast.   Margarine.    cup cornflakes.   1 cup milk.   Beverage.  Lunch   cup chicken noodle soup.   2 to 3 oz sliced roast beef.   2 slices seedless rye bread.   Mayonnaise.    cup tomato juice.   1 small banana.   Beverage.  Dinner  3 oz baked chicken.    cup scalloped potatoes.    cup cooked beets.   White dinner roll.   Margarine.    cup canned peaches.   Beverage.  Document Released: 07/12/2001 Document Revised: 01/09/2011   Document Reviewed: 12/23/2010 ExitCare Patient Information 2012 [employer].Diverticulitis Small pockets or "bubbles" can develop in the wall of the intestine. Diverticulitis is when those pockets become infected and inflamed. This causes stomach pain (usually  on the left side). HOME CARE  Take all medicine as told by your doctor.   Try a clear liquid diet (broth, tea, or water) for as long as told by your doctor.   Keep all follow-up visits with your doctor.   You may be put on a low-fiber diet once you start feeling better. Here are foods that have low-fiber:   White breads, cereals, rice, and pasta.   Cooked fruits and vegetables or soft fresh fruits and vegetables without the skin.   Ground or well-cooked tender beef, ham, veal, lamb, pork, or poultry.   Eggs and seafood.   After you are doing well on the low-fiber diet, you may be put on a high-fiber diet. Here are ways to increase your fiber:   Choose whole-grain breads, cereals, pasta, and brown rice.   Choose fruits and vegetables with skin on. Do not overcook the vegetables.   Choose nuts, seeds, legumes, dried peas, beans, and lentils.   Look for food products that have more than 3 grams of fiber per serving on the food label.  GET HELP RIGHT AWAY IF:  Your pain does not get better or gets worse.   You have trouble eating food.   You are not pooping (having bowel movements) like normal.   You have a temperature by mouth above 102 F (38.9 C), not controlled by medicine.   You keep throwing up (vomiting).   You have bloody or black, tarry poop (stools).   You are getting worse and not better.  MAKE SURE YOU:   Understand these instructions.   Will watch your condition.   Will get help right away if you are not doing well or get worse.  Document Released: 07/09/2007 Document Revised: 01/09/2011 Document Reviewed: 12/11/2008 Exit[employer] Patient Information 2012 [employer]. 

## 2011-06-25 NOTE — Progress Notes (Signed)
Discharge instructions reviewed with pt and prescriptions given.  Pt verbalized understanding and had no questions.  Pt discharged in stable condition with wife.  Hector Shade [place]

## 2011-06-25 NOTE — Discharge Summary (Signed)
Patient ID: Matthew Yates MRN: [id number] DOB/AGE: 59-Apr-1952 59 y.o.  Admit date: 06/20/2011 Discharge date: 06/25/2011  Procedures: none  Consults: None  Reason for Admission: This is a 61 yo male who presented to the [facility] with c/o suprapubic and LLQ abdominal pain.  He was found to have diverticulitis with a perforation.  Please see admitting H&P for further details.  Admission Diagnoses:  1. Acute sigmoid diverticulitis with perforation  Hospital Course: The patient was admitted and placed on IV abx and made NPO.  He was given a couple of days of bowel rest.  His pain began to slowly improve.  As it improved, his diet was advanced to clear liquids.  He had a minor set back one day and was kept on clears for 2 days due to slight increase in pain; however, this resolved by the following day.  His diet was then able to be advanced as tolerated.  His WBC normalized quickly and he was switched to oral abx therapy.  He was felt stable for discharge home on HD#5.  PE: Abd: soft, Nt, ND, +BS Heart: regular Lungs: CTAB  Discharge Diagnoses:  Principal Problem:  *Diverticulitis of large intestine with perforation   Discharge Medications: Medication List  As of 06/25/2011  8:23 AM   TAKE these medications         ciprofloxacin 500 MG tablet   Commonly known as: CIPRO   Take 1 tablet (500 mg total) by mouth 2 (two) times daily.      metroNIDAZOLE 500 MG tablet   Commonly known as: FLAGYL   Take 1 tablet (500 mg total) by mouth every 8 (eight) hours.      oxyCODONE-acetaminophen 5-325 MG per tablet   Commonly known as: PERCOCET   Take 1 tablet by mouth every 4 (four) hours as needed for pain.            Discharge Instructions: Follow-up Information    Follow up with Robyne Askew, MD. Schedule an appointment as soon as possible for a visit in 2 weeks.   Contact information:   [facility] [address] [place]  [address] [phone]          Signed: Letha Cape 06/25/2011, 8:23 AM

## 2011-07-02 ENCOUNTER — Encounter (INDEPENDENT_AMBULATORY_CARE_PROVIDER_SITE_OTHER): Payer: Self-pay | Admitting: General Surgery

## 2011-07-02 ENCOUNTER — Ambulatory Visit (INDEPENDENT_AMBULATORY_CARE_PROVIDER_SITE_OTHER): Payer: BC Managed Care – PPO | Admitting: General Surgery

## 2011-07-02 VITALS — BP 142/92 | HR 104 | Temp 98.4°F | Resp 16 | Ht 74.0 in | Wt 256.2 lb

## 2011-07-02 DIAGNOSIS — K5720 Diverticulitis of large intestine with perforation and abscess without bleeding: Secondary | ICD-10-CM

## 2011-07-02 DIAGNOSIS — K5732 Diverticulitis of large intestine without perforation or abscess without bleeding: Secondary | ICD-10-CM

## 2011-07-02 NOTE — Patient Instructions (Signed)
No seeds or nuts

## 2011-07-03 ENCOUNTER — Encounter (INDEPENDENT_AMBULATORY_CARE_PROVIDER_SITE_OTHER): Payer: Self-pay

## 2011-07-04 ENCOUNTER — Telehealth (INDEPENDENT_AMBULATORY_CARE_PROVIDER_SITE_OTHER): Payer: Self-pay | Admitting: General Surgery

## 2011-07-04 ENCOUNTER — Encounter (INDEPENDENT_AMBULATORY_CARE_PROVIDER_SITE_OTHER): Payer: Self-pay | Admitting: General Surgery

## 2011-07-04 NOTE — Telephone Encounter (Signed)
LMOM for pt letting him know that I sent his return to work letter to the fax number he wanted it sent to.

## 2011-07-04 NOTE — Progress Notes (Signed)
Subjective:     Patient ID: Matthew Yates, male   DOB: 11-Apr-1950, 61 y.o.   MRN: [id number]  HPI The patient is a 61 year old white male who was hospitalized recently with diverticulitis. He was treated with antibiotics and his symptoms resolved. He denies any pain now. He denies any fevers. His appetite is good and his bowels are working normally. On his original CT there was a question of some fluid next to the colon but this was not clear.  Review of Systems  Constitutional: Negative.   HENT: Negative.   Eyes: Negative.   Respiratory: Negative.   Cardiovascular: Negative.   Gastrointestinal: Negative.   Genitourinary: Negative.   Musculoskeletal: Negative.   Skin: Negative.   Neurological: Negative.   Hematological: Negative.   Psychiatric/Behavioral: Negative.        Objective:   Physical Exam  Constitutional: He is oriented to person, place, and time. He appears well-developed and well-nourished.  HENT:  Head: Normocephalic and atraumatic.  Eyes: Conjunctivae and EOM are normal. Pupils are equal, round, and reactive to light.  Neck: Normal range of motion. Neck supple.  Cardiovascular: Normal rate, regular rhythm and normal heart sounds.   Pulmonary/Chest: Effort normal and breath sounds normal.  Abdominal: Soft. Bowel sounds are normal. He exhibits no mass. There is no tenderness.  Musculoskeletal: Normal range of motion.  Neurological: He is alert and oriented to person, place, and time.  Skin: Skin is warm and dry.  Psychiatric: He has a normal mood and affect. His behavior is normal.       Assessment:     The patient appears to have an uncomplicated case of diverticulitis that resolved with antibiotic therapy    Plan:     At this point I would repeat his CT scan of his abdomen and pelvis to confirm that there is no fluid collection. If the CT looks good and I would recommend that modification at this point with close observation. I have talked to him about  this and he is in agreement

## 2011-08-14 ENCOUNTER — Encounter (INDEPENDENT_AMBULATORY_CARE_PROVIDER_SITE_OTHER): Payer: Self-pay | Admitting: General Surgery

## 2011-08-14 ENCOUNTER — Ambulatory Visit (INDEPENDENT_AMBULATORY_CARE_PROVIDER_SITE_OTHER): Payer: BC Managed Care – PPO | Admitting: General Surgery

## 2011-08-14 VITALS — BP 136/82 | HR 65 | Temp 97.8°F | Ht 74.0 in | Wt 260.4 lb

## 2011-08-14 DIAGNOSIS — K5732 Diverticulitis of large intestine without perforation or abscess without bleeding: Secondary | ICD-10-CM

## 2011-08-14 DIAGNOSIS — K219 Gastro-esophageal reflux disease without esophagitis: Secondary | ICD-10-CM

## 2011-08-14 DIAGNOSIS — K5720 Diverticulitis of large intestine with perforation and abscess without bleeding: Secondary | ICD-10-CM

## 2011-08-14 MED ORDER — ESOMEPRAZOLE MAGNESIUM 20 MG PO CPDR: 20.0000 mg | DELAYED_RELEASE_CAPSULE | Freq: Every day | ORAL | Status: DC

## 2011-08-14 NOTE — Progress Notes (Signed)
Subjective:     Patient ID: Matthew Yates, male   DOB: 02/25/1950, 61 y.o.   MRN: [id number]  HPI The patient is a 61 year old black male who was in the hospital almost 2 months ago with a diverticulitis. He responded well to antibiotics and modification. He has been off antibiotics for at least a month and is doing very well. He is back to his normal activities. He denies any abdominal pain. His bowels are moving normally.  Review of Systems  Constitutional: Negative.   HENT: Negative.   Eyes: Negative.   Respiratory: Negative.   Cardiovascular: Negative.   Gastrointestinal: Negative.   Genitourinary: Negative.   Musculoskeletal: Negative.   Skin: Negative.   Neurological: Negative.   Hematological: Negative.   Psychiatric/Behavioral: Negative.        Objective:   Physical Exam  Constitutional: He is oriented to person, place, and time. He appears well-developed and well-nourished.  HENT:  Head: Normocephalic and atraumatic.  Eyes: Conjunctivae and EOM are normal. Pupils are equal, round, and reactive to light.  Neck: Normal range of motion. Neck supple.  Cardiovascular: Normal rate, regular rhythm and normal heart sounds.   Pulmonary/Chest: Effort normal and breath sounds normal.  Abdominal: Soft. Bowel sounds are normal. He exhibits no mass. There is no tenderness.  Musculoskeletal: Normal range of motion.  Neurological: He is alert and oriented to person, place, and time.  Skin: Skin is warm and dry.  Psychiatric: He has a normal mood and affect. His behavior is normal.       Assessment:     Sigmoid diverticulitis that has resolved    Plan:     Since the patient has done so well and he is completely asymptomatic and has been that way for almost 2 months I do not think there is much utility and repeating the CT scan at this point. He will continue with diet modification. If his pain returns he will call us immediately.

## 2016-04-25 ENCOUNTER — Ambulatory Visit (INDEPENDENT_AMBULATORY_CARE_PROVIDER_SITE_OTHER): Payer: BLUE CROSS/BLUE SHIELD | Admitting: Family

## 2016-04-25 ENCOUNTER — Encounter (INDEPENDENT_AMBULATORY_CARE_PROVIDER_SITE_OTHER): Payer: Self-pay | Admitting: Family

## 2016-04-25 ENCOUNTER — Ambulatory Visit (INDEPENDENT_AMBULATORY_CARE_PROVIDER_SITE_OTHER): Payer: Self-pay

## 2016-04-25 VITALS — Ht 74.0 in | Wt 260.0 lb

## 2016-04-25 DIAGNOSIS — M25562 Pain in left knee: Secondary | ICD-10-CM

## 2016-04-25 DIAGNOSIS — M1712 Unilateral primary osteoarthritis, left knee: Secondary | ICD-10-CM | POA: Insufficient documentation

## 2016-04-25 MED ORDER — LIDOCAINE HCL 1 % IJ SOLN
5.0000 mL | INTRAMUSCULAR | Status: AC | PRN
  Administered 2016-04-25: 5 mL

## 2016-04-25 MED ORDER — METHYLPREDNISOLONE ACETATE 40 MG/ML IJ SUSP
40.0000 mg | INTRAMUSCULAR | Status: AC | PRN
  Administered 2016-04-25: 40 mg via INTRA_ARTICULAR

## 2016-04-25 NOTE — Progress Notes (Signed)
Office Visit Note   Patient: Matthew Yates           Date of Birth: [birth year]           MRN: [id number] Visit Date: 04/25/2016              Requested by: Derinda Late, MD [phone] [facility] [place], [place] [address] PCP: Marcello Fennel, MD  Chief Complaint  Patient presents with  . Left Knee - Pain    HPI: The patient is a 66 year old gentleman who presents today for evaluation of left knee pain. This is then ongoing for about 5 weeks. He has had chronic knee pain however playing tennis several weeks ago he thinks he may have twisted his knee. Has had swelling since. He has been using ice which has been helpful. Feels the knee is weak feels the knee is going to give way. This locks and catches on him. Today complaining of anterior and posterior knee pain, fullness. This is made worse with weightbearing.  Assessment & Plan: Visit Diagnoses:  1. Acute pain of left knee   2. Primary osteoarthritis of left knee     Plan: Cortisone injection today. He'll follow-up in 4 more weeks. Consider MRI rule out meniscal pathology at that time if he has continued symptoms.  Follow-Up Instructions: Return in about 4 weeks (around 05/23/2016).   Physical Exam  Constitutional: Appears well-developed.  Head: Normocephalic.  Eyes: EOM are normal.  Neck: Normal range of motion.  Cardiovascular: Normal rate.   Pulmonary/Chest: Effort normal.  Neurological: Is alert.  Skin: Skin is warm.  Psychiatric: Has a normal mood and affect. Does have an antalgic gait. Right Knee Exam   Tenderness  The patient is experiencing tenderness in the lateral joint line, medial retinaculum and medial joint line.  Tests  Varus: negative Valgus: negative  Other  Swelling: moderate  Comments:  Reduced flexion and extension due to swelling.       Imaging: Xr Knee 1-2 Views Left  Result Date: 04/25/2016 Radiographs of the left knee show  osteophytic bone spurring and tract compartmental osteoarthritis with bone-on-bone contact.   Labs: No results found for: HGBA1C, ESRSEDRATE, CRP, LABURIC, REPTSTATUS, GRAMSTAIN, CULT, LABORGA  Orders:  Orders Placed This Encounter  Procedures  . XR Knee 1-2 Views Left   No orders of the defined types were placed in this encounter.    Procedures: Large Joint Inj Date/Time: 04/25/2016 4:10 PM Performed by: Suzan Slick Authorized by: Dondra Prader R   Consent Given by:  Patient Site marked: the procedure site was marked   Timeout: prior to procedure the correct patient, procedure, and site was verified   Indications:  Pain and diagnostic evaluation Location:  Knee Site:  L knee Needle Size:  22 G Needle Length:  1.5 inches Ultrasound Guidance: No   Fluoroscopic Guidance: No   Arthrogram: No   Medications:  5 mL lidocaine 1 %; 40 mg methylPREDNISolone acetate 40 MG/ML Aspiration Attempted: No   Patient tolerance:  Patient tolerated the procedure well with no immediate complications     Clinical Data: No additional findings.  ROS: Review of Systems  Constitutional: Negative for chills and fever.  Musculoskeletal: Positive for arthralgias and joint swelling. Negative for gait problem.    Objective: Vital Signs: Ht 6\' 2"  (1.88 m)   Wt 260 lb (117.9 kg)   BMI 33.38 kg/m   Specialty Comments:  No specialty comments  available.  PMFS History: Patient Active Problem List   Diagnosis Date Noted  . Primary osteoarthritis of left knee 04/25/2016  . Gastroesophageal reflux 08/14/2011  . Diverticulitis of large intestine with perforation 06/20/2011   No past medical history on file.  Family History  Problem Relation Age of Onset  . Cancer Mother   . Cancer Sister     ovarian    Past Surgical History:  Procedure Laterality Date  . HIP SURGERY     Social History   Occupational History  . Not on file.   Social History Main Topics  . Smoking status: Never  Smoker  . Smokeless tobacco: Never Used  . Alcohol use No  . Drug use: No  . Sexual activity: Not on file

## 2016-05-22 ENCOUNTER — Ambulatory Visit (INDEPENDENT_AMBULATORY_CARE_PROVIDER_SITE_OTHER): Payer: BLUE CROSS/BLUE SHIELD | Admitting: Orthopedic Surgery

## 2016-05-22 DIAGNOSIS — M25562 Pain in left knee: Secondary | ICD-10-CM | POA: Diagnosis not present

## 2016-05-22 NOTE — Progress Notes (Signed)
   Office Visit Note   Patient: Matthew Yates           Date of Birth: 1950-02-27           MRN: [id number] Visit Date: 05/22/2016              Requested by: Derinda Late, MD [phone] [facility] [place], [place] [address] PCP: Marcello Fennel, MD  No chief complaint on file.     HPI: The patient is a 66 year old gentleman who presents today in follow-up for left knee pain. He has had a history of chronic knee pain however acutely had worsening with mechanical symptoms. Had a twisting injury about 9 weeks ago. Has felt thatcan give our that he may fall over the last 4 weeks. Did have a steroid injection about 4 weeks ago which she states provided minimal relief of pain. Has had no relief with mechanical symptoms.  Assessment & Plan: Visit Diagnoses:  1. Acute pain of left knee     Plan: We'll proceed with MRI left knee. Follow-up in office for MRI review.  Follow-Up Instructions: Return for p mri.   Left Knee Exam   Tenderness  The patient is experiencing no tenderness.     Range of Motion  The patient has normal left knee ROM.  Tests  Varus: negative Valgus: negative  Other  Erythema: absent Swelling: mild Effusion: no effusion present      Patient is alert, oriented, no adenopathy, well-dressed, normal affect, normal respiratory effort.   Imaging: No results found.  Labs: No results found for: HGBA1C, ESRSEDRATE, CRP, LABURIC, REPTSTATUS, GRAMSTAIN, CULT, LABORGA  Orders:  Orders Placed This Encounter  Procedures  . MR Knee Left w/o contrast   No orders of the defined types were placed in this encounter.    Procedures: No procedures performed  Clinical Data: No additional findings.  ROS:  All other systems negative, except as noted in the HPI. Review of Systems  Constitutional: Negative for chills and fever.  Musculoskeletal: Positive for arthralgias and joint swelling.     Objective: Vital Signs: There were no vitals taken for this visit.  Specialty Comments:  No specialty comments available.  PMFS History: Patient Active Problem List   Diagnosis Date Noted  . Primary osteoarthritis of left knee 04/25/2016  . Gastroesophageal reflux 08/14/2011  . Diverticulitis of large intestine with perforation 06/20/2011   No past medical history on file.  Family History  Problem Relation Age of Onset  . Cancer Mother   . Cancer Sister     ovarian    Past Surgical History:  Procedure Laterality Date  . HIP SURGERY     Social History   Occupational History  . Not on file.   Social History Main Topics  . Smoking status: Never Smoker  . Smokeless tobacco: Never Used  . Alcohol use No  . Drug use: No  . Sexual activity: Not on file

## 2016-05-23 ENCOUNTER — Telehealth (INDEPENDENT_AMBULATORY_CARE_PROVIDER_SITE_OTHER): Payer: Self-pay | Admitting: *Deleted

## 2016-05-23 NOTE — Telephone Encounter (Signed)
Pt has appt for MRI on Thurs April 26 at 1:00p at [place].Matthew Yates, pt is to arrive at 12:45 to register, [facility] to pt for appt information

## 2016-05-26 NOTE — Telephone Encounter (Signed)
Pt aware of appt.

## 2016-05-29 ENCOUNTER — Ambulatory Visit (HOSPITAL_COMMUNITY)
Admission: RE | Admit: 2016-05-29 | Discharge: 2016-05-29 | Disposition: A | Discharge status: Home / Self Care | Payer: BLUE CROSS/BLUE SHIELD | Source: Ambulatory Visit | Attending: Family | Admitting: Family

## 2016-05-29 DIAGNOSIS — M1712 Unilateral primary osteoarthritis, left knee: Secondary | ICD-10-CM | POA: Insufficient documentation

## 2016-05-29 DIAGNOSIS — M25562 Pain in left knee: Secondary | ICD-10-CM | POA: Insufficient documentation

## 2016-05-29 DIAGNOSIS — S83242A Other tear of medial meniscus, current injury, left knee, initial encounter: Secondary | ICD-10-CM | POA: Insufficient documentation

## 2016-06-02 ENCOUNTER — Telehealth (INDEPENDENT_AMBULATORY_CARE_PROVIDER_SITE_OTHER): Payer: Self-pay

## 2016-06-02 NOTE — Telephone Encounter (Signed)
Talked with patient and scheduled appointment for 06/03/2016.

## 2016-06-02 NOTE — Telephone Encounter (Signed)
-----   Message from Suzan Slick, NP sent at 06/02/2016  8:39 AM EDT ----- Regarding: mri review  Will you schedule for mri review  ----- Message ----- From: Interface, Rad Results In Sent: 05/29/2016   2:27 PM To: Suzan Slick, NP

## 2016-06-03 ENCOUNTER — Ambulatory Visit (INDEPENDENT_AMBULATORY_CARE_PROVIDER_SITE_OTHER): Payer: BLUE CROSS/BLUE SHIELD | Admitting: Orthopaedic Surgery

## 2016-06-03 ENCOUNTER — Encounter (INDEPENDENT_AMBULATORY_CARE_PROVIDER_SITE_OTHER): Payer: Self-pay | Admitting: Orthopaedic Surgery

## 2016-06-03 DIAGNOSIS — M84469A Pathological fracture, unspecified tibia and fibula, initial encounter for fracture: Secondary | ICD-10-CM | POA: Diagnosis not present

## 2016-06-03 DIAGNOSIS — S83242A Other tear of medial meniscus, current injury, left knee, initial encounter: Secondary | ICD-10-CM

## 2016-06-03 NOTE — Progress Notes (Addendum)
Office Visit Note   Patient: Matthew Yates           Date of Birth: [birth year]           MRN: [id number] Visit Date: 06/03/2016              Requested by: Derinda Late, MD [phone] [facility] [place], [place] [address] PCP: Marcello Fennel, MD   Assessment & Plan: Visit Diagnoses:  1. Acute medial meniscus tear, left, initial encounter   2. 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 He also has edema of the medial tibial plateau that is concerning for insufficiency fracture. At this point I discussed arthroscopic surgery with medial meniscal debridement with arthroscopically aided sub-chondroplasty for the insufficiency fracture. Patient will think about this and call us back. He has our number. Total face to face encounter time was greater than 25 minutes and over half of this time was spent in counseling and/or coordination of care.  Follow-Up Instructions: Return if symptoms worsen or fail to improve.   Orders:  No orders of the defined types were placed in this encounter.  No orders of the defined types were placed in this encounter.     Procedures: No procedures performed   Clinical Data: No additional findings.   Subjective: Chief Complaint  Patient presents with  . Left Knee - Follow-up    Patient is a 66 year old gentleman who comes in for follow-up of his MRI. He previously seen Erin for knee pain and did not get much relief from cortisone injection.  He still complains of significant mechanical symptoms with giving way catching locking popping.    Review of Systems  Constitutional: Negative.   All other systems reviewed and are negative.    Objective: Vital Signs: There were no vitals taken for this visit.  Physical Exam  Constitutional: He is oriented to person, place, and  time. He appears well-developed and well-nourished.  Pulmonary/Chest: Effort normal.  Abdominal: Soft.  Neurological: He is alert and oriented to person, place, and time.  Skin: Skin is warm.  Psychiatric: He has a normal mood and affect. His behavior is normal. Judgment and thought content normal.  Nursing note and vitals reviewed.   Ortho Exam Left knee exam shows a moderate joint effusion. He does have mild joint line tenderness both medially and laterally. His range of motion is appropriate. Collaterals and cruciates are stable. Specialty Comments:  No specialty comments available.  Imaging: No results found.   PMFS History: Patient Active Problem List   Diagnosis Date Noted  . Acute medial meniscus tear, left, initial encounter 06/03/2016  . Insufficiency fracture of tibia 06/03/2016  . Primary osteoarthritis of left knee 04/25/2016  . Gastroesophageal reflux 08/14/2011  . Diverticulitis of large intestine with perforation 06/20/2011   No past medical history on file.  Family History  Problem Relation Age of Onset  . Cancer Mother   . Cancer Sister     ovarian    Past Surgical History:  Procedure Laterality Date  . HIP SURGERY     Social History   Occupational History  . Not on file.   Social History Main Topics  . Smoking status: Never Smoker  . Smokeless tobacco: Never Used  . Alcohol use No  . Drug use: No  . Sexual activity: Not on file

## 2016-06-09 ENCOUNTER — Telehealth (INDEPENDENT_AMBULATORY_CARE_PROVIDER_SITE_OTHER): Payer: Self-pay | Admitting: *Deleted

## 2016-06-09 NOTE — Telephone Encounter (Signed)
Pt came in very upset that he has not gotten a phone call or email back about scheduling his suegrey. Pt is trying to schedule somethig for summer and needs to get this scheduled first. I explained the process and that it could take a little longer than a week for him to hear from you. Pt asking for a call back. [phone]

## 2016-06-09 NOTE — Telephone Encounter (Signed)
I called patient and scheduled surgery. 

## 2016-07-04 ENCOUNTER — Encounter (HOSPITAL_BASED_OUTPATIENT_CLINIC_OR_DEPARTMENT_OTHER): Payer: Self-pay | Admitting: *Deleted

## 2016-07-04 NOTE — Progress Notes (Signed)
   07/04/16 1201  OBSTRUCTIVE SLEEP APNEA  Have you ever been diagnosed with sleep apnea through a sleep study? No  Do you snore loudly (loud enough to be heard through closed doors)?  1  Do you often feel tired, fatigued, or sleepy during the daytime (such as falling asleep during driving or talking to someone)? 0  Has anyone observed you stop breathing during your sleep? 0  Do you have, or are you being treated for high blood pressure? 1  BMI more than 35 kg/m2? 0  Age > 50 (1-yes) 1  Neck circumference greater than:Male 16 inches or larger, Male 17inches or larger? 0  Male Gender (Yes=1) 1  Obstructive Sleep Apnea Score 4

## 2016-07-07 ENCOUNTER — Encounter (HOSPITAL_BASED_OUTPATIENT_CLINIC_OR_DEPARTMENT_OTHER)
Admission: RE | Admit: 2016-07-07 | Discharge: 2016-07-07 | Disposition: A | Discharge status: Home / Self Care | Payer: BLUE CROSS/BLUE SHIELD | Source: Ambulatory Visit | Attending: Orthopaedic Surgery | Admitting: Orthopaedic Surgery

## 2016-07-07 ENCOUNTER — Other Ambulatory Visit: Payer: Self-pay

## 2016-07-07 ENCOUNTER — Other Ambulatory Visit (INDEPENDENT_AMBULATORY_CARE_PROVIDER_SITE_OTHER): Payer: Self-pay | Admitting: Orthopaedic Surgery

## 2016-07-07 DIAGNOSIS — M23204 Derangement of unspecified medial meniscus due to old tear or injury, left knee: Secondary | ICD-10-CM | POA: Diagnosis present

## 2016-07-07 DIAGNOSIS — S83242D Other tear of medial meniscus, current injury, left knee, subsequent encounter: Secondary | ICD-10-CM

## 2016-07-07 DIAGNOSIS — Z8041 Family history of malignant neoplasm of ovary: Secondary | ICD-10-CM | POA: Diagnosis not present

## 2016-07-07 DIAGNOSIS — M8448XA Pathological fracture, other site, initial encounter for fracture: Secondary | ICD-10-CM | POA: Diagnosis not present

## 2016-07-07 DIAGNOSIS — Z809 Family history of malignant neoplasm, unspecified: Secondary | ICD-10-CM | POA: Diagnosis not present

## 2016-07-07 DIAGNOSIS — E669 Obesity, unspecified: Secondary | ICD-10-CM | POA: Diagnosis not present

## 2016-07-07 DIAGNOSIS — I10 Essential (primary) hypertension: Secondary | ICD-10-CM | POA: Diagnosis not present

## 2016-07-07 DIAGNOSIS — Z79899 Other long term (current) drug therapy: Secondary | ICD-10-CM | POA: Diagnosis not present

## 2016-07-07 DIAGNOSIS — M23222 Derangement of posterior horn of medial meniscus due to old tear or injury, left knee: Secondary | ICD-10-CM | POA: Diagnosis not present

## 2016-07-07 DIAGNOSIS — M65862 Other synovitis and tenosynovitis, left lower leg: Secondary | ICD-10-CM | POA: Diagnosis not present

## 2016-07-07 DIAGNOSIS — Z6834 Body mass index (BMI) 34.0-34.9, adult: Secondary | ICD-10-CM | POA: Diagnosis not present

## 2016-07-07 DIAGNOSIS — M1712 Unilateral primary osteoarthritis, left knee: Secondary | ICD-10-CM | POA: Diagnosis not present

## 2016-07-07 DIAGNOSIS — M94262 Chondromalacia, left knee: Secondary | ICD-10-CM | POA: Diagnosis not present

## 2016-07-07 DIAGNOSIS — K219 Gastro-esophageal reflux disease without esophagitis: Secondary | ICD-10-CM | POA: Diagnosis not present

## 2016-07-08 NOTE — Anesthesia Preprocedure Evaluation (Addendum)
Anesthesia Evaluation  Patient identified by MRN, date of birth, ID band Patient awake    Reviewed: Allergy & Precautions, NPO status , Patient's Chart, lab work & pertinent test results  Airway Mallampati: II  TM Distance: >3 FB Neck ROM: Full    Dental  (+) Missing   Pulmonary neg pulmonary ROS,    Pulmonary exam normal breath sounds clear to auscultation       Cardiovascular hypertension, Pt. on medications Normal cardiovascular exam Rhythm:Regular Rate:Normal     Neuro/Psych negative neurological ROS  negative psych ROS   GI/Hepatic Neg liver ROS, GERD  ,  Endo/Other  negative endocrine ROS  Renal/GU negative Renal ROS     Musculoskeletal negative musculoskeletal ROS (+) Arthritis ,   Abdominal (+) + obese,   Peds  Hematology negative hematology ROS (+)   Anesthesia Other Findings   Reproductive/Obstetrics                            Anesthesia Physical Anesthesia Plan  ASA: II  Anesthesia Plan: General   Post-op Pain Management:    Induction: Intravenous  PONV Risk Score and Plan: 2 and Ondansetron, Treatment may vary due to age, Propofol and Midazolam  Airway Management Planned: LMA  Additional Equipment:   Intra-op Plan:   Post-operative Plan: Extubation in OR  Informed Consent: I have reviewed the patients History and Physical, chart, labs and discussed the procedure including the risks, benefits and alternatives for the proposed anesthesia with the patient or authorized representative who has indicated his/her understanding and acceptance.   Dental advisory given  Plan Discussed with: CRNA  Anesthesia Plan Comments:         Anesthesia Quick Evaluation

## 2016-07-09 ENCOUNTER — Ambulatory Visit (HOSPITAL_BASED_OUTPATIENT_CLINIC_OR_DEPARTMENT_OTHER): Payer: BLUE CROSS/BLUE SHIELD | Admitting: Anesthesiology

## 2016-07-09 ENCOUNTER — Encounter (HOSPITAL_BASED_OUTPATIENT_CLINIC_OR_DEPARTMENT_OTHER): Payer: Self-pay | Admitting: *Deleted

## 2016-07-09 ENCOUNTER — Encounter (HOSPITAL_BASED_OUTPATIENT_CLINIC_OR_DEPARTMENT_OTHER)
Admission: RE | Disposition: A | Discharge status: Home / Self Care | Payer: Self-pay | Source: Ambulatory Visit | Attending: Orthopaedic Surgery

## 2016-07-09 ENCOUNTER — Ambulatory Visit (HOSPITAL_BASED_OUTPATIENT_CLINIC_OR_DEPARTMENT_OTHER)
Admission: RE | Admit: 2016-07-09 | Discharge: 2016-07-09 | Disposition: A | Discharge status: Home / Self Care | Payer: BLUE CROSS/BLUE SHIELD | Source: Ambulatory Visit | Attending: Orthopaedic Surgery | Admitting: Orthopaedic Surgery

## 2016-07-09 DIAGNOSIS — M94262 Chondromalacia, left knee: Secondary | ICD-10-CM | POA: Insufficient documentation

## 2016-07-09 DIAGNOSIS — Z79899 Other long term (current) drug therapy: Secondary | ICD-10-CM | POA: Insufficient documentation

## 2016-07-09 DIAGNOSIS — Z8041 Family history of malignant neoplasm of ovary: Secondary | ICD-10-CM | POA: Insufficient documentation

## 2016-07-09 DIAGNOSIS — M8448XA Pathological fracture, other site, initial encounter for fracture: Secondary | ICD-10-CM | POA: Insufficient documentation

## 2016-07-09 DIAGNOSIS — M23222 Derangement of posterior horn of medial meniscus due to old tear or injury, left knee: Secondary | ICD-10-CM | POA: Diagnosis not present

## 2016-07-09 DIAGNOSIS — M65862 Other synovitis and tenosynovitis, left lower leg: Secondary | ICD-10-CM | POA: Insufficient documentation

## 2016-07-09 DIAGNOSIS — M1712 Unilateral primary osteoarthritis, left knee: Secondary | ICD-10-CM | POA: Insufficient documentation

## 2016-07-09 DIAGNOSIS — Z6834 Body mass index (BMI) 34.0-34.9, adult: Secondary | ICD-10-CM | POA: Insufficient documentation

## 2016-07-09 DIAGNOSIS — Z809 Family history of malignant neoplasm, unspecified: Secondary | ICD-10-CM | POA: Insufficient documentation

## 2016-07-09 DIAGNOSIS — M94269 Chondromalacia, unspecified knee: Secondary | ICD-10-CM | POA: Diagnosis not present

## 2016-07-09 DIAGNOSIS — E669 Obesity, unspecified: Secondary | ICD-10-CM | POA: Insufficient documentation

## 2016-07-09 DIAGNOSIS — K219 Gastro-esophageal reflux disease without esophagitis: Secondary | ICD-10-CM | POA: Insufficient documentation

## 2016-07-09 DIAGNOSIS — M659 Synovitis and tenosynovitis, unspecified: Secondary | ICD-10-CM | POA: Diagnosis not present

## 2016-07-09 DIAGNOSIS — S83242D Other tear of medial meniscus, current injury, left knee, subsequent encounter: Secondary | ICD-10-CM | POA: Diagnosis not present

## 2016-07-09 DIAGNOSIS — I10 Essential (primary) hypertension: Secondary | ICD-10-CM | POA: Insufficient documentation

## 2016-07-09 DIAGNOSIS — M9428 Chondromalacia, other site: Secondary | ICD-10-CM | POA: Diagnosis not present

## 2016-07-09 HISTORY — PX: CHONDROPLASTY: SHX5177

## 2016-07-09 HISTORY — DX: Essential (primary) hypertension: I10

## 2016-07-09 HISTORY — DX: Unspecified osteoarthritis, unspecified site: M19.90

## 2016-07-09 HISTORY — DX: Other tear of medial meniscus, current injury, left knee, initial encounter: S83.242A

## 2016-07-09 HISTORY — PX: KNEE ARTHROSCOPY WITH MEDIAL MENISECTOMY: SHX5651

## 2016-07-09 HISTORY — DX: Gastro-esophageal reflux disease without esophagitis: K21.9

## 2016-07-09 SURGERY — ARTHROSCOPY, KNEE, WITH MEDIAL MENISCECTOMY
Anesthesia: General | Site: Knee | Laterality: Left

## 2016-07-09 MED ORDER — CEFAZOLIN SODIUM-DEXTROSE 1-4 GM/50ML-% IV SOLN
INTRAVENOUS | Status: AC
  Filled 2016-07-09: qty 50

## 2016-07-09 MED ORDER — HYDROCODONE-ACETAMINOPHEN 7.5-325 MG PO TABS: 1.0000 | ORAL_TABLET | Freq: Four times a day (QID) | ORAL | 0 refills | Status: AC | PRN

## 2016-07-09 MED ORDER — MIDAZOLAM HCL 5 MG/5ML IJ SOLN
INTRAMUSCULAR | Status: DC | PRN
  Administered 2016-07-09: 2 mg via INTRAVENOUS

## 2016-07-09 MED ORDER — OXYCODONE HCL 5 MG PO TABS: 5.0000 mg | ORAL_TABLET | Freq: Once | ORAL | Status: DC | PRN

## 2016-07-09 MED ORDER — PROPOFOL 10 MG/ML IV BOLUS
INTRAVENOUS | Status: AC
  Filled 2016-07-09: qty 20

## 2016-07-09 MED ORDER — BUPIVACAINE HCL (PF) 0.25 % IJ SOLN
INTRAMUSCULAR | Status: DC | PRN
  Administered 2016-07-09: 9 mL

## 2016-07-09 MED ORDER — OXYCODONE HCL 5 MG/5ML PO SOLN: 5.0000 mg | Freq: Once | ORAL | Status: DC | PRN

## 2016-07-09 MED ORDER — FENTANYL CITRATE (PF) 100 MCG/2ML IJ SOLN
INTRAMUSCULAR | Status: DC | PRN
Start: 2016-07-09 — End: 2016-07-09
  Administered 2016-07-09: 50 ug via INTRAVENOUS
  Administered 2016-07-09: 100 ug via INTRAVENOUS
  Administered 2016-07-09 (×4): 50 ug via INTRAVENOUS

## 2016-07-09 MED ORDER — ONDANSETRON HCL 4 MG/2ML IJ SOLN
INTRAMUSCULAR | Status: DC | PRN
  Administered 2016-07-09: 4 mg via INTRAVENOUS

## 2016-07-09 MED ORDER — PROMETHAZINE HCL 25 MG/ML IJ SOLN: 6.2500 mg | INTRAMUSCULAR | Status: DC | PRN

## 2016-07-09 MED ORDER — CEFAZOLIN SODIUM-DEXTROSE 2-4 GM/100ML-% IV SOLN
INTRAVENOUS | Status: AC
  Filled 2016-07-09: qty 100

## 2016-07-09 MED ORDER — FENTANYL CITRATE (PF) 100 MCG/2ML IJ SOLN
INTRAMUSCULAR | Status: AC
  Filled 2016-07-09: qty 2

## 2016-07-09 MED ORDER — NALOXONE HCL 0.4 MG/ML IJ SOLN
INTRAMUSCULAR | Status: AC
  Filled 2016-07-09: qty 1

## 2016-07-09 MED ORDER — BUPIVACAINE HCL (PF) 0.25 % IJ SOLN
INTRAMUSCULAR | Status: AC
  Filled 2016-07-09: qty 60

## 2016-07-09 MED ORDER — DEXAMETHASONE SODIUM PHOSPHATE 4 MG/ML IJ SOLN
INTRAMUSCULAR | Status: DC | PRN
  Administered 2016-07-09: 10 mg via INTRAVENOUS

## 2016-07-09 MED ORDER — BUPIVACAINE HCL (PF) 0.25 % IJ SOLN
INTRAMUSCULAR | Status: AC
  Filled 2016-07-09: qty 30

## 2016-07-09 MED ORDER — FENTANYL CITRATE (PF) 100 MCG/2ML IJ SOLN: 25.0000 ug | INTRAMUSCULAR | Status: DC | PRN

## 2016-07-09 MED ORDER — KETOROLAC TROMETHAMINE 30 MG/ML IJ SOLN: 30.0000 mg | Freq: Once | INTRAMUSCULAR | Status: DC | PRN

## 2016-07-09 MED ORDER — FENTANYL CITRATE (PF) 100 MCG/2ML IJ SOLN: 50.0000 ug | INTRAMUSCULAR | Status: DC | PRN

## 2016-07-09 MED ORDER — DEXAMETHASONE SODIUM PHOSPHATE 10 MG/ML IJ SOLN
INTRAMUSCULAR | Status: AC
  Filled 2016-07-09: qty 1

## 2016-07-09 MED ORDER — SCOPOLAMINE 1 MG/3DAYS TD PT72: 1.0000 | MEDICATED_PATCH | Freq: Once | TRANSDERMAL | Status: DC | PRN

## 2016-07-09 MED ORDER — LACTATED RINGERS IV SOLN
INTRAVENOUS | Status: DC
  Administered 2016-07-09 (×2): via INTRAVENOUS

## 2016-07-09 MED ORDER — PROPOFOL 10 MG/ML IV BOLUS
INTRAVENOUS | Status: DC | PRN
  Administered 2016-07-09: 200 mg via INTRAVENOUS

## 2016-07-09 MED ORDER — METOPROLOL TARTRATE 5 MG/5ML IV SOLN
INTRAVENOUS | Status: AC
  Filled 2016-07-09: qty 5

## 2016-07-09 MED ORDER — SODIUM CHLORIDE 0.9 % IR SOLN
Status: DC | PRN
  Administered 2016-07-09: 3000 mL

## 2016-07-09 MED ORDER — DEXTROSE 5 % IV SOLN
3.0000 g | Freq: Once | INTRAVENOUS | Status: AC
  Administered 2016-07-09: 3 g via INTRAVENOUS

## 2016-07-09 MED ORDER — LIDOCAINE 2% (20 MG/ML) 5 ML SYRINGE
INTRAMUSCULAR | Status: AC
  Filled 2016-07-09: qty 5

## 2016-07-09 MED ORDER — KETOROLAC TROMETHAMINE 30 MG/ML IJ SOLN
INTRAMUSCULAR | Status: AC
  Filled 2016-07-09: qty 1

## 2016-07-09 MED ORDER — ONDANSETRON HCL 4 MG PO TABS: 4.0000 mg | ORAL_TABLET | Freq: Three times a day (TID) | ORAL | 0 refills | Status: DC | PRN

## 2016-07-09 MED ORDER — ONDANSETRON HCL 4 MG/2ML IJ SOLN
INTRAMUSCULAR | Status: AC
  Filled 2016-07-09: qty 2

## 2016-07-09 MED ORDER — CEFAZOLIN SODIUM-DEXTROSE 2-4 GM/100ML-% IV SOLN: 2.0000 g | INTRAVENOUS | Status: DC

## 2016-07-09 MED ORDER — KETOROLAC TROMETHAMINE 30 MG/ML IJ SOLN
INTRAMUSCULAR | Status: DC | PRN
  Administered 2016-07-09: 30 mg via INTRAVENOUS

## 2016-07-09 MED ORDER — MIDAZOLAM HCL 2 MG/2ML IJ SOLN: 1.0000 mg | INTRAMUSCULAR | Status: DC | PRN

## 2016-07-09 MED ORDER — MEPERIDINE HCL 25 MG/ML IJ SOLN: 6.2500 mg | INTRAMUSCULAR | Status: DC | PRN

## 2016-07-09 MED ORDER — MIDAZOLAM HCL 2 MG/2ML IJ SOLN
INTRAMUSCULAR | Status: AC
  Filled 2016-07-09: qty 2

## 2016-07-09 MED ORDER — SENNOSIDES-DOCUSATE SODIUM 8.6-50 MG PO TABS: 1.0000 | ORAL_TABLET | Freq: Every evening | ORAL | 1 refills | Status: DC | PRN

## 2016-07-09 MED ORDER — METOPROLOL TARTRATE 5 MG/5ML IV SOLN
INTRAVENOUS | Status: DC | PRN
  Administered 2016-07-09: 2.5 mg via INTRAVENOUS

## 2016-07-09 SURGICAL SUPPLY — 43 items
BANDAGE ACE 6X5 VEL STRL LF (GAUZE/BANDAGES/DRESSINGS) ×4 IMPLANT
BANDAGE ESMARK 6X9 LF (GAUZE/BANDAGES/DRESSINGS) IMPLANT
BLADE 4.2CUDA (BLADE) ×3 IMPLANT
BLADE CUDA GRT WHITE 3.5 (BLADE) IMPLANT
BLADE CUDA SHAVER 3.5 (BLADE) IMPLANT
BLADE CUTTER GATOR 3.5 (BLADE) IMPLANT
BNDG CMPR 9X6 STRL LF SNTH (GAUZE/BANDAGES/DRESSINGS)
BNDG ESMARK 6X9 LF (GAUZE/BANDAGES/DRESSINGS)
CUFF TOURNIQUET SINGLE 34IN LL (TOURNIQUET CUFF) ×3 IMPLANT
DRAPE ARTHROSCOPY W/POUCH 90 (DRAPES) ×3 IMPLANT
DRAPE C-ARM 42X72 X-RAY (DRAPES) ×3 IMPLANT
DRAPE C-ARMOR (DRAPES) ×3 IMPLANT
DRAPE IMP U-DRAPE 54X76 (DRAPES) ×2 IMPLANT
DRAPE U-SHAPE 47X51 STRL (DRAPES) ×3 IMPLANT
DURAPREP 26ML APPLICATOR (WOUND CARE) ×3 IMPLANT
ELECT MENISCUS 165MM 90D (ELECTRODE) IMPLANT
ELECT REM PT RETURN 9FT ADLT (ELECTROSURGICAL)
ELECTRODE REM PT RTRN 9FT ADLT (ELECTROSURGICAL) IMPLANT
GAUZE SPONGE 4X4 12PLY STRL (GAUZE/BANDAGES/DRESSINGS) ×3 IMPLANT
GAUZE XEROFORM 1X8 LF (GAUZE/BANDAGES/DRESSINGS) ×3 IMPLANT
GLOVE BIO SURGEON STRL SZ 6.5 (GLOVE) ×2 IMPLANT
GLOVE BIOGEL PI IND STRL 7.0 (GLOVE) ×2 IMPLANT
GLOVE BIOGEL PI INDICATOR 7.0 (GLOVE) ×2
GLOVE SKINSENSE NS SZ7.5 (GLOVE) ×1
GLOVE SKINSENSE STRL SZ7.5 (GLOVE) ×2 IMPLANT
GLOVE SURG SYN 7.5  E (GLOVE) ×1
GLOVE SURG SYN 7.5 E (GLOVE) ×2 IMPLANT
GLOVE SURG SYN 7.5 PF PI (GLOVE) ×1 IMPLANT
GOWN STRL REIN XL XLG (GOWN DISPOSABLE) ×3 IMPLANT
GOWN STRL REUS W/ TWL LRG LVL3 (GOWN DISPOSABLE) ×2 IMPLANT
GOWN STRL REUS W/TWL LRG LVL3 (GOWN DISPOSABLE) ×3
KNEE WRAP E Z 3 GEL PACK (MISCELLANEOUS) ×3 IMPLANT
MANIFOLD NEPTUNE II (INSTRUMENTS) ×3 IMPLANT
PACK ARTHROSCOPY DSU (CUSTOM PROCEDURE TRAY) ×3 IMPLANT
PACK BASIN DAY SURGERY FS (CUSTOM PROCEDURE TRAY) ×3 IMPLANT
PENCIL BUTTON HOLSTER BLD 10FT (ELECTRODE) IMPLANT
RESECTOR FULL RADIUS 4.2MM (BLADE) ×2 IMPLANT
SET ARTHROSCOPY TUBING (MISCELLANEOUS) ×3
SET ARTHROSCOPY TUBING LN (MISCELLANEOUS) ×2 IMPLANT
SUT ETHILON 3 0 PS 1 (SUTURE) ×3 IMPLANT
TOWEL OR 17X24 6PK STRL BLUE (TOWEL DISPOSABLE) ×3 IMPLANT
TOWEL OR NON WOVEN STRL DISP B (DISPOSABLE) ×2 IMPLANT
WATER STERILE IRR 1000ML POUR (IV SOLUTION) ×3 IMPLANT

## 2016-07-09 NOTE — Anesthesia Postprocedure Evaluation (Signed)
Anesthesia Post Note  Patient: Matthew Yates  Procedure(s) Performed: Procedure(s) (LRB): LEFT KNEE ARTHROSCOPY WITH PARTIAL MEDIAL MENISCECTOMY (Left) CHONDROPLASTY with MICROFRACTURE (Left)     Patient location during evaluation: PACU Anesthesia Type: General Level of consciousness: sedated and patient cooperative Pain management: pain level controlled Vital Signs Assessment: post-procedure vital signs reviewed and stable Respiratory status: spontaneous breathing Cardiovascular status: stable Anesthetic complications: no    Last Vitals:  Vitals:   07/09/16 1030 07/09/16 1124  BP: 128/64 124/62  Pulse: 62 70  Resp:  16  Temp:  36.4 C    Last Pain:  Vitals:   07/09/16 1124  TempSrc: Oral  PainSc:                  [name]

## 2016-07-09 NOTE — Op Note (Signed)
   Date of Surgery: 07/09/2016  INDICATIONS: Mr. Bushey is a 66 y.o.-year-old male with a left knee pain;  The patient did consent to the procedure after discussion of the risks and benefits.  PREOPERATIVE DIAGNOSIS:  1. Left knee synovitis 2. 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 Left knee degenerative complex medial meniscal tear  POSTOPERATIVE DIAGNOSIS: Same.  PROCEDURE:  1. Left knee arthroscopic major synovectomy 2. Left knee arthroscopic microfracture procedure of medial femoral condyle, medial tibial Plateau, femoral trochlea 3. Left knee arthroscopic partial medial meniscectomy  SURGEON: N. Eduard Roux, M.D.  ASSIST: None.  ANESTHESIA:  general  IV FLUIDS AND URINE: See anesthesia.  ESTIMATED BLOOD LOSS: None mL.  IMPLANTS: None  DRAINS: None  COMPLICATIONS: None.  DESCRIPTION OF PROCEDURE: The patient was brought to the operating room and placed supine on the operating table.  The patient had been signed prior to the procedure and this was documented. The patient had the anesthesia placed by the anesthesiologist.  A time-out was performed to confirm that this was the correct patient, site, side and location. The patient did receive antibiotics prior to the incision and was re-dosed during the procedure as needed at indicated intervals.  A tourniquet was placed.  The patient had the operative extremity prepped and draped in the standard surgical fashion.    I created the standard anterolateral and anteromedial arthroscopy portals. We first addressed the medial compartment. The medial compartment exhibited a focal area of grade 4 chondromalacia of the medial tibial plateau along with widespread grade 3 chondromalacia. Patient also had grade 4 chondromalacia of the medial femoral condyle mainly in the extension weightbearing surface. He also had a complex degenerative tear of the posterior horn medial meniscus. I first performed a  synovectomy of the medial compartment with an oscillating shaver. I then performed a partial medial meniscectomy of the unstable portion of the medial meniscal tear using oscillating shaver back to a stable border. I then performed a microfracture procedure of the medial femoral condyle and the focal area of the medial tibial plateau. Chondroplasty was then performed to smooth out the cartilage and the microfracture sites. I then perform synovectomy of the femoral notch and the lateral compartment in order to address these areas. The lateral compartment exhibited yellowing of the articular cartilage without any focal issues with the meniscus or the cartilage. I then perform synovectomy of the patellofemoral compartment. He did also have widespread grade 4 chondromalacia of the superior femoral trochlea. Microfracture in this area was again performed. Chondroplasty was then performed. Excess fluid was drained. The incisions were closed with interrupted 3-0 nylon sutures. Sterile dressings were applied. Patient tolerated procedure well and no immediate complications.  POSTOPERATIVE PLAN: Patient will be discharged home. He will need to be on crutches for protected weightbearing for 2 weeks.  Azucena Cecil, MD [place] 9:24 AM

## 2016-07-09 NOTE — Discharge Instructions (Signed)
°Post Anesthesia Home Care Instructions ° °Activity: °Get plenty of rest for the remainder of the day. A responsible individual must stay with you for 24 hours following the procedure.  °For the next 24 hours, DO NOT: °-Drive a car °-Operate machinery °-Drink alcoholic beverages °-Take any medication unless instructed by your physician °-Make any legal decisions or sign important papers. ° °Meals: °Start with liquid foods such as gelatin or soup. Progress to regular foods as tolerated. Avoid greasy, spicy, heavy foods. If nausea and/or vomiting occur, drink only clear liquids until the nausea and/or vomiting subsides. Call your physician if vomiting continues. ° °Special Instructions/Symptoms: °Your throat may feel dry or sore from the anesthesia or the breathing tube placed in your throat during surgery. If this causes discomfort, gargle with warm salt water. The discomfort should disappear within 24 hours. ° °If you had a scopolamine patch placed behind your ear for the management of post- operative nausea and/or vomiting: ° °1. The medication in the patch is effective for 72 hours, after which it should be removed.  Wrap patch in a tissue and discard in the trash. Wash hands thoroughly with soap and water. °2. You may remove the patch earlier than 72 hours if you experience unpleasant side effects which may include dry mouth, dizziness or visual disturbances. °3. Avoid touching the patch. Wash your hands with soap and water after contact with the patch. °  ° ° ° ° ° ° °Post-operative patient instructions  °Knee Arthroscopy  ° °• Ice:  Place intermittent ice or cooler pack over your knee, 30 minutes on and 30 minutes off.  Continue this for the first 72 hours after surgery, then save ice for use after therapy sessions or on more active days.   °• Weight:  You may bear weight on your leg as your symptoms allow. °• Crutches:  Use crutches (or walker) to assist in walking until told to discontinue by your physical  therapist or physician. This will help to reduce pain. °• Strengthening:  Perform simple thigh squeezes (isometric quad contractions) and straight leg lifts as you are able (3 sets of 5 to 10 repetitions, 3 times a day).  For the leg lifts, have someone support under your ankle in the beginning until you have increased strength enough to do this on your own.  To help get started on thigh squeezes, place a pillow under your knee and push down on the pillow with back of knee (sometimes easier to do than with your leg fully straight). °• Motion:  Perform gentle knee motion as tolerated - this is gentle bending and straightening of the knee. Seated heel slides: you can start by sitting in a chair, remove your brace, and gently slide your heel back on the floor - allowing your knee to bend. Have someone help you straighten your knee (or use your other leg/foot hooked under your ankle.  °• Dressing:  Perform 1st dressing change at 2 days postoperative. A moderate amount of blood tinged drainage is to be expected.  So if you bleed through the dressing on the first or second day or if you have fevers, it is fine to change the dressing/check the wounds early and redress wound. Elevate your leg.  If it bleeds through again, or if the incisions are leaking frank blood, please call the office. May change dressing every 1-2 days thereafter to help watch wounds. Can purchase Tegaderm (or 3M Nexcare) water resistant dressings at local pharmacy / [facility]. °• Shower:    Light shower is ok after 2 days.  Please take shower, NO bath. Recover with gauze and ace wrap to help keep wounds protected.   °• Pain medication:  A narcotic pain medication has been prescribed.  Take as directed.  Typically you need narcotic pain medication more regularly during the first 3 to 5 days after surgery.  Decrease your use of the medication as the pain improves.  Narcotics can sometimes cause constipation, even after a few doses.  If you have problems  with constipation, you can take an over the counter stool softener or light laxative.  If you have persistent problems, please notify your physician’s office. °• Physical therapy: Additional activity guidelines to be provided by your physician or physical therapist at follow-up visits.  °• Driving: Do not recommend driving x 2 weeks post surgical, especially if surgery performed on right side. Should not drive while taking narcotic pain medications. It typically takes at least 2 weeks to restore sufficient neuromuscular function for normal reaction times for driving safety.  °• Call [phone] for questions or problems. Evenings you will be forwarded to the hospital operator.  Ask for the orthopaedic physician on call. Please call if you experience:  °  °o Redness, foul smelling, or persistent drainage from the surgical site  °o worsening knee pain and swelling not responsive to medication  °o any calf pain and or swelling of the lower leg  °o temperatures greater than 101.5 F °o other questions or concerns ° ° °Thank you for allowing us to be a part of your care. ° °

## 2016-07-09 NOTE — Transfer of Care (Signed)
Immediate Anesthesia Transfer of Care Note  Patient: Matthew Yates  Procedure(s) Performed: Procedure(s): LEFT KNEE ARTHROSCOPY WITH PARTIAL MEDIAL MENISCECTOMY (Left) CHONDROPLASTY with MICROFRACTURE (Left)  Patient Location: PACU  Anesthesia Type:General  Level of Consciousness: sedated  Airway & Oxygen Therapy: Patient Spontanous Breathing and Patient connected to face mask oxygen  Post-op Assessment: Report given to RN and Post -op Vital signs reviewed and stable  Post vital signs: Reviewed and stable  Last Vitals:  Vitals:   07/09/16 0730  BP: (!) 155/88  Pulse: 77  Resp: 18  Temp: 36.7 C    Last Pain:  Vitals:   07/09/16 0730  TempSrc: Oral      Patients Stated Pain Goal: 0 (17/49/44 9675)  Complications: No apparent anesthesia complications

## 2016-07-09 NOTE — H&P (Signed)
    PREOPERATIVE H&P  Chief Complaint: left knee medial meniscal tear, subchondral insufficiency fracture  HPI: Matthew Yates is a 66 y.o. male who presents for surgical treatment of left knee medial meniscal tear, subchondral insufficiency fracture.  He denies any changes in medical history.  Past Medical History:  Diagnosis Date  . Acute medial meniscus tear of left knee   . Arthritis    lt knee  . GERD (gastroesophageal reflux disease)   . Hypertension    Past Surgical History:  Procedure Laterality Date  . COLONOSCOPY     x2  . HIP SURGERY     aspiration only, no anesthesia   Social History   Social History  . Marital status: Married    Spouse name: N/A  . Number of children: N/A  . Years of education: N/A   Social History Main Topics  . Smoking status: Never Smoker  . Smokeless tobacco: Never Used  . Alcohol use No  . Drug use: No  . Sexual activity: Not Asked   Other Topics Concern  . None   Social History Narrative  . None   Family History  Problem Relation Age of Onset  . Cancer Mother   . Cancer Sister        ovarian   Allergies  Allergen Reactions  . Other Other (See Comments)    Pt states that a certain anesthesia for dental work caused his HR to lower drastically. Cannot remember name but understands that it is not a commonly used choice (i.e., novocain).    Prior to Admission medications   Medication Sig Start Date End Date Taking? Authorizing Provider  amLODipine (NORVASC) 5 MG tablet Take by mouth. 05/06/16 05/06/17 Yes [provider]  ibuprofen (ADVIL,MOTRIN) 400 MG tablet Take 400 mg by mouth every 6 (six) hours as needed.   Yes [provider]  loratadine-pseudoephedrine (CLARITIN-D 12-HOUR) 5-120 MG tablet Take 1 tablet by mouth 2 (two) times daily.   Yes [provider]  esomeprazole (NEXIUM) 20 MG capsule Take 1 capsule (20 mg total) by mouth daily before breakfast. 08/14/11 07/04/16  Autumn Messing III, MD      Positive ROS: All other systems have been reviewed and were otherwise negative with the exception of those mentioned in the HPI and as above.  Physical Exam: General: Alert, no acute distress Cardiovascular: No pedal edema Respiratory: No cyanosis, no use of accessory musculature GI: abdomen soft Skin: No lesions in the area of chief complaint Neurologic: Sensation intact distally Psychiatric: Patient is competent for consent with normal mood and affect Lymphatic: no lymphedema  MUSCULOSKELETAL: exam stable  Assessment: left knee medial meniscal tear, subchondral insufficiency fracture  Plan: Plan for Procedure(s): LEFT KNEE ARTHROSCOPY WITH PARTIAL MEDIAL MENISCECTOMY, SUBCHONDROPLASTY KNEE ARTHROSCOPY WITH SUBCHONDROPLASTY  The risks benefits and alternatives were discussed with the patient including but not limited to the risks of nonoperative treatment, versus surgical intervention including infection, bleeding, nerve injury,  blood clots, cardiopulmonary complications, morbidity, mortality, among others, and they were willing to proceed.   Eduard Roux, MD   07/09/2016 8:27 AM

## 2016-07-09 NOTE — Anesthesia Procedure Notes (Signed)
Procedure Name: LMA Insertion Date/Time: 07/09/2016 8:36 AM Performed by: Toula Moos L Pre-anesthesia Checklist: Patient identified, Emergency Drugs available, Suction available, Patient being monitored and Timeout performed Patient Re-evaluated:Patient Re-evaluated prior to inductionOxygen Delivery Method: Circle system utilized Preoxygenation: Pre-oxygenation with 100% oxygen Intubation Type: IV induction Ventilation: Mask ventilation without difficulty LMA: LMA inserted LMA Size: 5.0 Number of attempts: 1 Airway Equipment and Method: Bite block Placement Confirmation: positive ETCO2 Tube secured with: Tape Dental Injury: Teeth and Oropharynx as per pre-operative assessment

## 2016-07-10 ENCOUNTER — Encounter (HOSPITAL_BASED_OUTPATIENT_CLINIC_OR_DEPARTMENT_OTHER): Payer: Self-pay | Admitting: Orthopaedic Surgery

## 2016-07-22 ENCOUNTER — Ambulatory Visit (INDEPENDENT_AMBULATORY_CARE_PROVIDER_SITE_OTHER): Payer: BLUE CROSS/BLUE SHIELD | Admitting: Orthopaedic Surgery

## 2016-07-22 ENCOUNTER — Encounter (INDEPENDENT_AMBULATORY_CARE_PROVIDER_SITE_OTHER): Payer: Self-pay | Admitting: Orthopaedic Surgery

## 2016-07-22 DIAGNOSIS — S83242A Other tear of medial meniscus, current injury, left knee, initial encounter: Secondary | ICD-10-CM

## 2016-07-22 DIAGNOSIS — M1712 Unilateral primary osteoarthritis, left knee: Secondary | ICD-10-CM

## 2016-07-22 NOTE — Progress Notes (Signed)
Mr. Bilotti is 2 weeks status post left knee arthroscopy with partial medial meniscectomy, chondroplasty, microfracture surgery. He is overall doing okay. He does not have any signs or symptoms of infection. The incisions are healed. The sutures were removed. Minimal swelling. He is ambulating with a cane. I reviewed the arthroscopy pictures with him today. At this point I recommend physical therapy for quad strengthening and mobilization. Follow-up in 4 weeks for recheck. I think he is appropriate to return to office and desk duty for the next 4 weeks starting next Wednesday.

## 2016-07-29 ENCOUNTER — Telehealth (INDEPENDENT_AMBULATORY_CARE_PROVIDER_SITE_OTHER): Payer: Self-pay | Admitting: *Deleted

## 2016-07-29 NOTE — Telephone Encounter (Signed)
Received fax from [address] advising pt has appt scheduled 07/28/16 at 5pm. Per fax pt is aware of appt

## 2016-08-19 ENCOUNTER — Ambulatory Visit (INDEPENDENT_AMBULATORY_CARE_PROVIDER_SITE_OTHER): Payer: BLUE CROSS/BLUE SHIELD | Admitting: Orthopaedic Surgery

## 2016-08-19 DIAGNOSIS — S83242A Other tear of medial meniscus, current injury, left knee, initial encounter: Secondary | ICD-10-CM

## 2016-08-19 NOTE — Progress Notes (Signed)
Patient is 6 weeks status post left knee arthroscopy with partial medial meniscectomy and microfracture surgery. He is doing much better. He is supposed to finish physical therapy at the next visit. He has been cutting his grass. He does complain of low bit of soreness but overall is doing well. On exam he has no effusion. He has excellent range of motion. He walks with a normal gait. Patient is doing well since he has been back to work. He has no lengths. I will see him back as needed.

## 2017-09-04 IMAGING — MR MR KNEE*L* W/O CM
4 of 6 series · 19 of 40 positions shown · non-contrast
Comparison: Plain films left knee performed at [REDACTED]
04/25/2016

CLINICAL DATA: Left knee pain and swelling for 6 weeks since an
injury playing tennis. No known injury.

EXAM:
MRI OF THE LEFT KNEE WITHOUT CONTRAST
TECHNIQUE: Multiplanar, multisequence MR imaging of the knee was performed. No
intravenous contrast was administered.

[Series 2: PD fat-sat · axial · 4.0mm · 0.27mm/px · z∈[-70,+50]mm · 6 of 25 slices shown (1 of 4)]
[im 1/25]
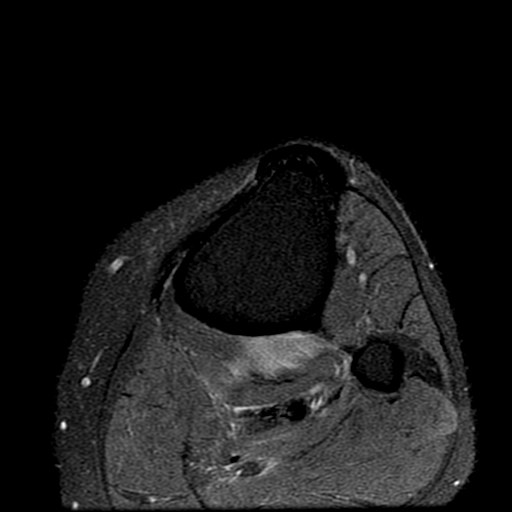
[im 5/25]
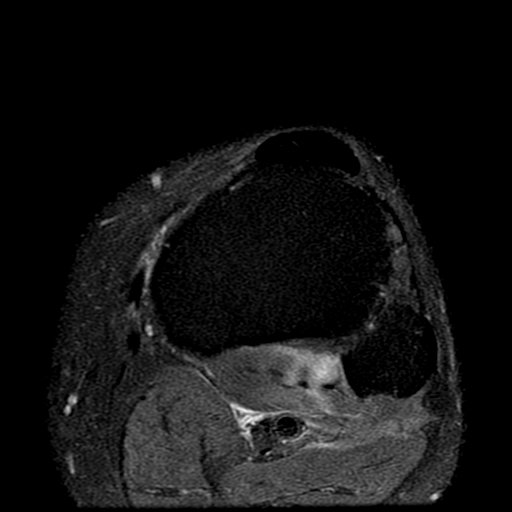
[im 10/25]
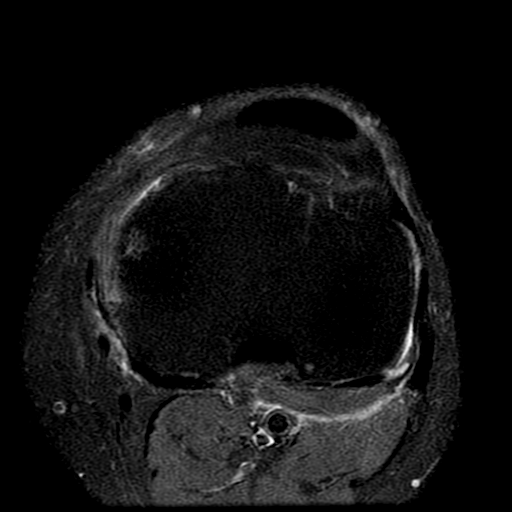
[im 15/25]
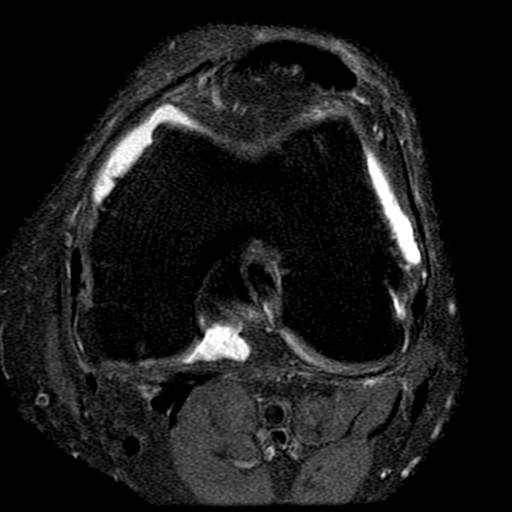
[im 20/25]
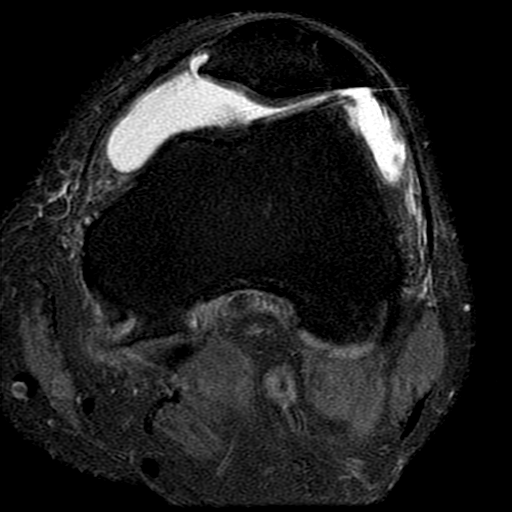
[im 25/25]
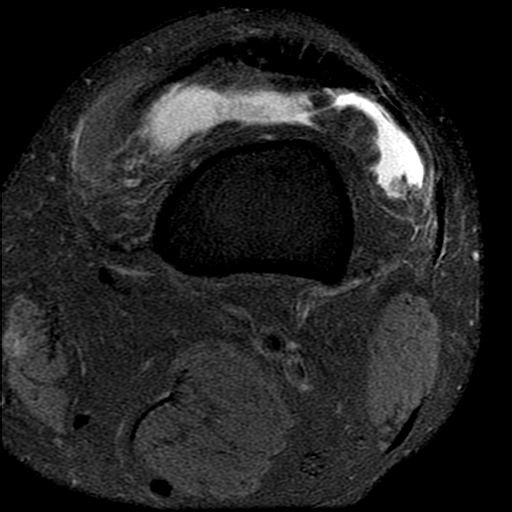

[Series 4: PD fat-sat · coronal · 4.0mm · 0.31mm/px · 7 of 27 slices shown (2 of 4)]
[im 1/27]
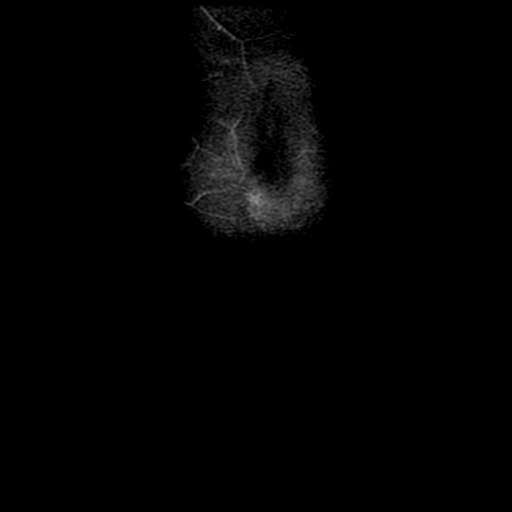
[im 5/27]
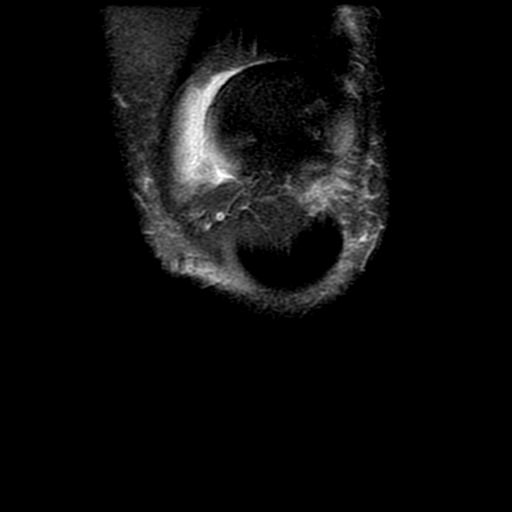
[im 9/27]
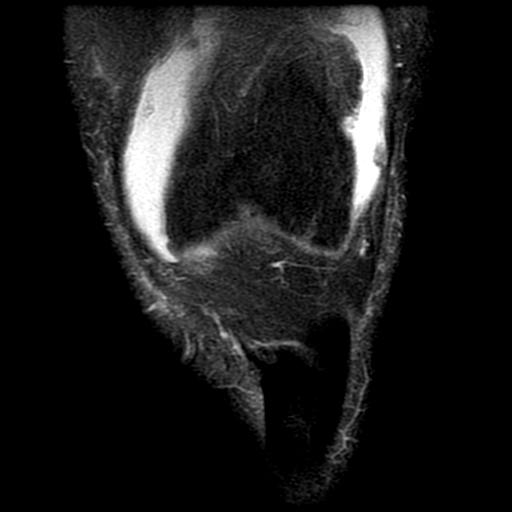
[im 14/27]
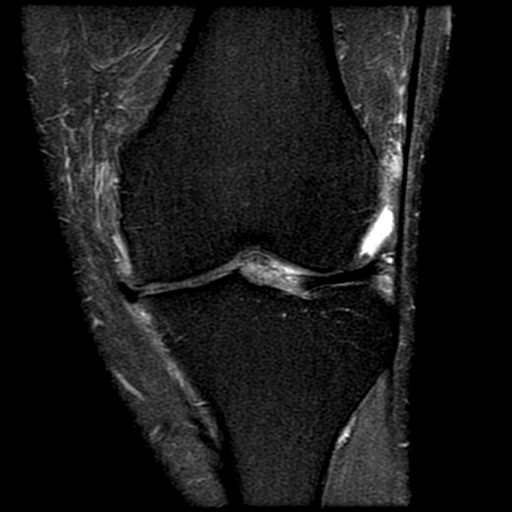
[im 18/27]
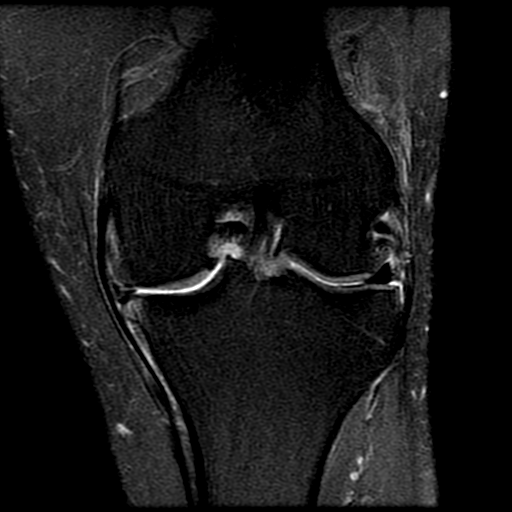
[im 22/27]
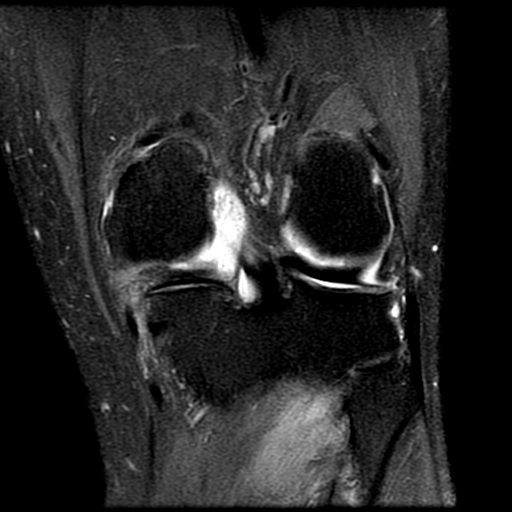
[im 27/27]
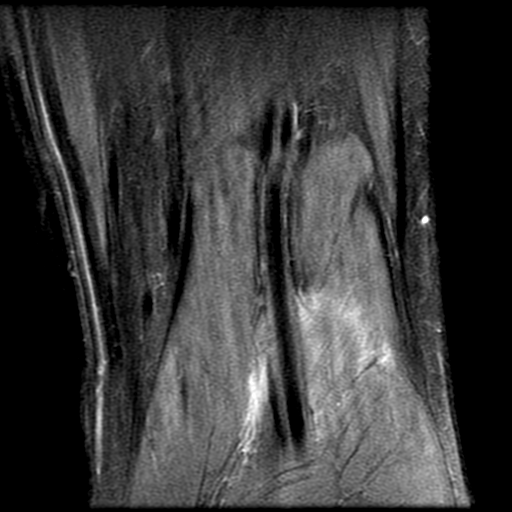

[Series 6: PD fat-sat · sagittal · 4.0mm · 0.31mm/px · 3 of 25 slices shown (3 of 4)]
[im 5/25]
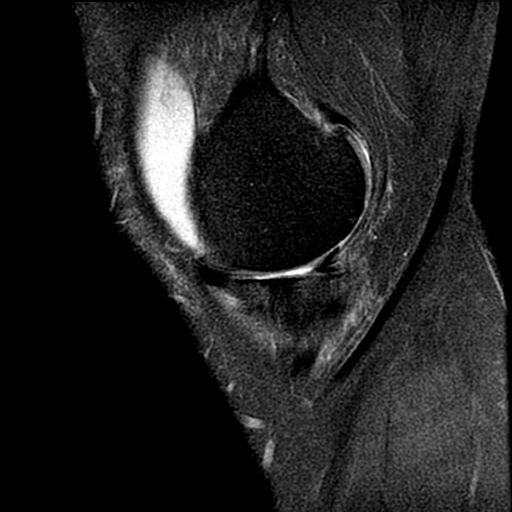
[im 13/25]
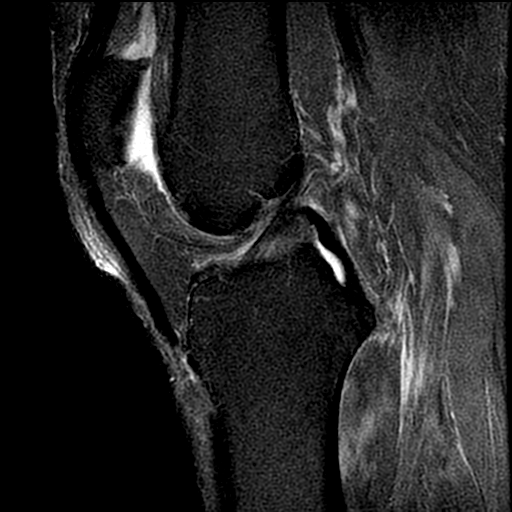
[im 21/25]
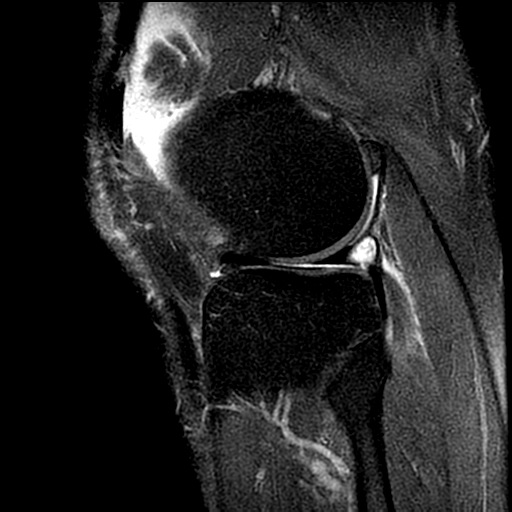

[Series 7: PD fat-sat · coronal · 2.0mm · 0.31mm/px · 3 of 21 slices shown (4 of 4)]
[im 5/21]
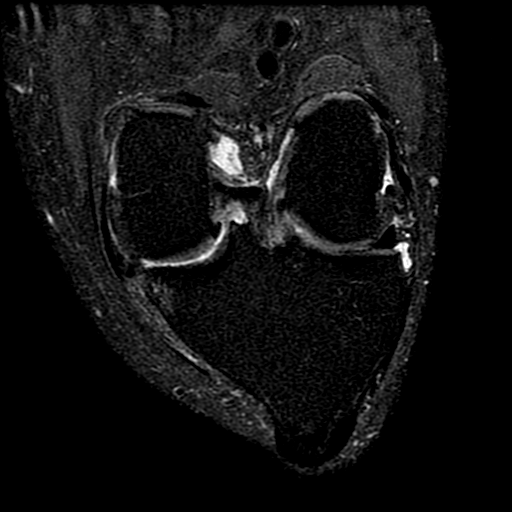
[im 13/21]
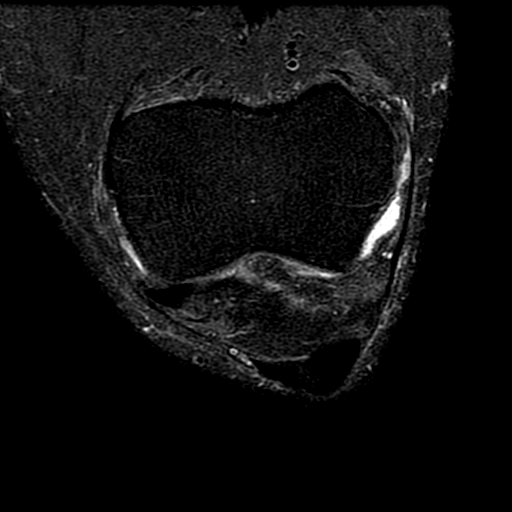
[im 21/21]
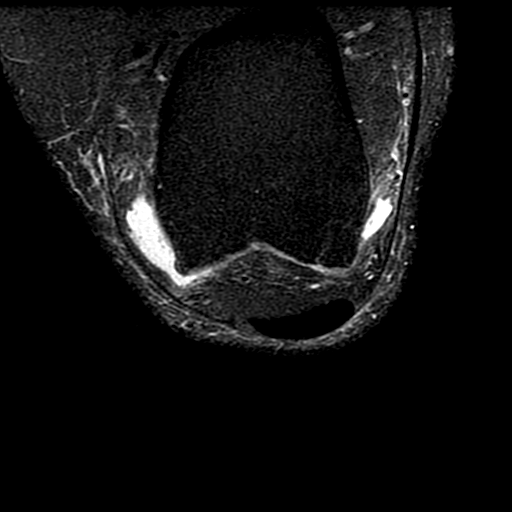

[19 of 40 positions shown; findings below may reference images not displayed]

FINDINGS: MENISCI

Medial meniscus: There is a horizontal tear at the junction of the
posterior horn and body reaching the femoral articular surface. The
body is diminutive and degenerated. Fraying along the free edge of
the posterior horn is identified.

Lateral meniscus:  Intact.

LIGAMENTS

Cruciates:  Intact.

Collaterals:  Intact.

CARTILAGE

Patellofemoral: Cartilage along the periphery of the lateral facet
appears denuded. Lateral tilt of the patella is noted.

Medial: Full-thickness defect overlying the posterior horn of the
medial meniscus measures 0.9 cm transverse by 0.9 cm AP.

Lateral:  Mildly degenerated.

Joint:  Moderate effusion.

Popliteal Fossa: No Baker's cyst. Edema seen at the musculotendinous
junction of the popliteus consistent with strain. No tear.

Extensor Mechanism:  Intact.

Bones: No fracture or worrisome lesion. Tricompartmental osteophytes
are noted.

Other: None.
IMPRESSION: Findings consistent with acute or subacute strain without tear of
the popliteus at the musculotendinous junction.

Horizontal tear at the junction of the posterior horn and body of
the medial meniscus.

Osteoarthritis about the knee appearing worst in the medial and
patellofemoral compartments.

## 2018-02-12 ENCOUNTER — Encounter: Payer: Self-pay | Admitting: Internal Medicine

## 2018-02-16 ENCOUNTER — Other Ambulatory Visit: Payer: Self-pay | Admitting: Internal Medicine

## 2018-02-16 DIAGNOSIS — D472 Monoclonal gammopathy: Secondary | ICD-10-CM

## 2018-02-18 ENCOUNTER — Telehealth: Payer: Self-pay | Admitting: Internal Medicine

## 2018-02-18 NOTE — Telephone Encounter (Signed)
Lab appt has been scheduled for the pt on 1/17 at 4pm. Pt has been cld and made aware

## 2018-02-19 ENCOUNTER — Inpatient Hospital Stay: Payer: BLUE CROSS/BLUE SHIELD | Attending: Internal Medicine

## 2018-02-19 DIAGNOSIS — C9000 Multiple myeloma not having achieved remission: Secondary | ICD-10-CM | POA: Insufficient documentation

## 2018-02-19 DIAGNOSIS — D472 Monoclonal gammopathy: Secondary | ICD-10-CM

## 2018-02-19 LAB — CBC WITH DIFFERENTIAL (CANCER CENTER ONLY)
Abs Immature Granulocytes: 0.02 10*3/uL (ref 0.00–0.07)
Basophils Absolute: 0 10*3/uL (ref 0.0–0.1)
Basophils Relative: 0 %
Eosinophils Absolute: 0.1 10*3/uL (ref 0.0–0.5)
Eosinophils Relative: 1 %
HCT: 35 % — ABNORMAL LOW (ref 39.0–52.0)
Hemoglobin: 11.6 g/dL — ABNORMAL LOW (ref 13.0–17.0)
Immature Granulocytes: 0 %
Lymphocytes Relative: 50 %
Lymphs Abs: 3.5 10*3/uL (ref 0.7–4.0)
MCH: 33.3 pg (ref 26.0–34.0)
MCHC: 33.1 g/dL (ref 30.0–36.0)
MCV: 100.6 fL — ABNORMAL HIGH (ref 80.0–100.0)
Monocytes Absolute: 0.7 10*3/uL (ref 0.1–1.0)
Monocytes Relative: 10 %
Neutro Abs: 2.8 10*3/uL (ref 1.7–7.7)
Neutrophils Relative %: 39 %
Platelet Count: 211 10*3/uL (ref 150–400)
RBC: 3.48 MIL/uL — ABNORMAL LOW (ref 4.22–5.81)
RDW: 14.6 % (ref 11.5–15.5)
WBC Count: 7.1 10*3/uL (ref 4.0–10.5)
nRBC: 0 % (ref 0.0–0.2)

## 2018-02-19 LAB — CMP (CANCER CENTER ONLY)
ALT: 14 U/L (ref 0–44)
AST: 15 U/L (ref 15–41)
Albumin: 3.4 g/dL — ABNORMAL LOW (ref 3.5–5.0)
Alkaline Phosphatase: 62 U/L (ref 38–126)
Anion gap: 10 (ref 5–15)
BUN: 11 mg/dL (ref 8–23)
CO2: 28 mmol/L (ref 22–32)
Calcium: 9.4 mg/dL (ref 8.9–10.3)
Chloride: 102 mmol/L (ref 98–111)
Creatinine: 1.14 mg/dL (ref 0.61–1.24)
GFR, Est AFR Am: >60 mL/min (ref >60)
GFR, Estimated: >60 mL/min (ref >60)
Glucose, Bld: 105 mg/dL — ABNORMAL HIGH (ref 70–99)
Potassium: 3.6 mmol/L (ref 3.5–5.1)
Sodium: 140 mmol/L (ref 135–145)
Total Bilirubin: 0.3 mg/dL (ref 0.3–1.2)
Total Protein: 10.1 g/dL — ABNORMAL HIGH (ref 6.5–8.1)

## 2018-02-19 LAB — LACTATE DEHYDROGENASE: LDH: 108 U/L (ref 98–192)

## 2018-02-20 ENCOUNTER — Inpatient Hospital Stay (HOSPITAL_BASED_OUTPATIENT_CLINIC_OR_DEPARTMENT_OTHER): Payer: BLUE CROSS/BLUE SHIELD | Admitting: Internal Medicine

## 2018-02-20 ENCOUNTER — Other Ambulatory Visit: Payer: Self-pay

## 2018-02-20 ENCOUNTER — Encounter: Payer: Self-pay | Admitting: Internal Medicine

## 2018-02-20 VITALS — BP 190/96 | HR 87 | Temp 98.6°F | Resp 18 | Ht 74.5 in | Wt 255.0 lb

## 2018-02-20 DIAGNOSIS — M1990 Unspecified osteoarthritis, unspecified site: Secondary | ICD-10-CM

## 2018-02-20 DIAGNOSIS — R779 Abnormality of plasma protein, unspecified: Secondary | ICD-10-CM

## 2018-02-20 DIAGNOSIS — Z8041 Family history of malignant neoplasm of ovary: Secondary | ICD-10-CM

## 2018-02-20 DIAGNOSIS — I10 Essential (primary) hypertension: Secondary | ICD-10-CM

## 2018-02-20 DIAGNOSIS — F419 Anxiety disorder, unspecified: Secondary | ICD-10-CM | POA: Diagnosis not present

## 2018-02-20 DIAGNOSIS — C9000 Multiple myeloma not having achieved remission: Secondary | ICD-10-CM

## 2018-02-20 DIAGNOSIS — Z800 Family history of malignant neoplasm of digestive organs: Secondary | ICD-10-CM

## 2018-02-20 LAB — IGG, IGA, IGM
IgA: 29 mg/dL — ABNORMAL LOW (ref 61–437)
IgG (Immunoglobin G), Serum: 4905 mg/dL — ABNORMAL HIGH (ref 700–1600)
IgM (Immunoglobulin M), Srm: 10 mg/dL — ABNORMAL LOW (ref 20–172)

## 2018-02-20 MED ORDER — ALPRAZOLAM 0.25 MG PO TABS: 0.2500 mg | ORAL_TABLET | Freq: Every evening | ORAL | 0 refills | Status: DC | PRN

## 2018-02-20 NOTE — Progress Notes (Signed)
[place] [phone]   [phone]  CONSULT NOTE  REFERRING PHYSICIAN: Dr. Barnabas Lister  REASON FOR CONSULTATION:  68 years old African-American male with elevated serum protein level.  HPI Matthew Yates is a 68 y.o. male with past medical history significant for osteoarthritis, GERD, hypertension as well as anxiety.  The patient was seen by his primary care physician for annual physical exam on February 04, 2018.  During his evaluation he had routine blood work including comprehensive metabolic panel that showed elevated serum protein of 8.7.  The patient was also found to have mild anemia.  He was referred to me today for evaluation and recommendation regarding these abnormalities.  When seen today the patient is very anxious and worried about these abnormalities and coming to the cancer center.  He also was treated recently for diverticulitis.  He denied having any chest pain, shortness of breath, cough or hemoptysis.  He has no nausea, vomiting, diarrhea or constipation.  He denied having any headache or visual changes.  He is very active. Family history significant for mother with colon cancer, sister had ovarian cancer and father died in an accident when he was 19 years old. The patient is married and has 1 daughter age 63.  He was accompanied today by his wife Matthew Yates.  He works as an Chief Financial Officer.  He has no history of smoking, alcohol or drug abuse.  HPI  Past Medical History:  Diagnosis Date  . Acute medial meniscus tear of left knee   . Arthritis    lt knee  . GERD (gastroesophageal reflux disease)   . Hypertension     Past Surgical History:  Procedure Laterality Date  . CHONDROPLASTY Left 07/09/2016   Procedure: CHONDROPLASTY with MICROFRACTURE;  Surgeon: Leandrew Koyanagi, MD;  Location: [facility];  Service: Orthopedics;  Laterality: Left;  . COLONOSCOPY     x2  . HIP SURGERY     aspiration only, no anesthesia  .  KNEE ARTHROSCOPY WITH MEDIAL MENISECTOMY Left 07/09/2016   Procedure: LEFT KNEE ARTHROSCOPY WITH PARTIAL MEDIAL MENISCECTOMY;  Surgeon: Leandrew Koyanagi, MD;  Location: [facility];  Service: Orthopedics;  Laterality: Left;    Family History  Problem Relation Age of Onset  . Cancer Mother   . Cancer Sister        ovarian    Social History Social History   Tobacco Use  . Smoking status: Never Smoker  . Smokeless tobacco: Never Used  Substance Use Topics  . Alcohol use: No  . Drug use: No    Allergies  Allergen Reactions  . Other Other (See Comments)    Pt states that a certain anesthesia for dental work caused his HR to lower drastically. Cannot remember name but understands that it is not a commonly used choice (i.e., novocain).     Current Outpatient Medications  Medication Sig Dispense Refill  . HYDROcodone-acetaminophen (NORCO) 7.5-325 MG tablet Take 1-2 tablets by mouth every 6 (six) hours as needed for moderate pain. 90 tablet 0  . ibuprofen (ADVIL,MOTRIN) 400 MG tablet Take 400 mg by mouth every 6 (six) hours as needed.    . loratadine-pseudoephedrine (CLARITIN-D 12-HOUR) 5-120 MG tablet Take 1 tablet by mouth 2 (two) times daily.    Marland Kitchen LORazepam (ATIVAN) 0.5 MG tablet Take by mouth.    . ondansetron (ZOFRAN) 4 MG tablet Take 1-2 tablets (4-8 mg total) by mouth every 8 (eight) hours as needed  for nausea or vomiting. 40 tablet 0  . senna-docusate (SENOKOT S) 8.6-50 MG tablet Take 1 tablet by mouth at bedtime as needed. 30 tablet 1  . amLODipine (NORVASC) 5 MG tablet Take by mouth.    . esomeprazole (NEXIUM) 20 MG capsule Take 1 capsule (20 mg total) by mouth daily before breakfast. 30 capsule 1   No current facility-administered medications for this visit.     Review of Systems  Constitutional: negative Eyes: negative Ears, nose, mouth, throat, and face: negative Respiratory: negative Cardiovascular: negative Gastrointestinal:  negative Genitourinary:negative Integument/breast: negative Hematologic/lymphatic: negative Musculoskeletal:negative Neurological: negative Behavioral/Psych: negative Endocrine: negative Allergic/Immunologic: negative  Physical Exam  RAL:alert, healthy, no distress, well nourished, well developed and anxious SKIN: skin color, texture, turgor are normal, no rashes or significant lesions HEAD: Normocephalic, No masses, lesions, tenderness or abnormalities EYES: normal, PERRLA, Conjunctiva are pink and non-injected EARS: External ears normal, Canals clear OROPHARYNX:no exudate, no erythema and lips, buccal mucosa, and tongue normal  NECK: supple, no adenopathy, no JVD LYMPH:  no palpable lymphadenopathy, no hepatosplenomegaly LUNGS: clear to auscultation , and palpation HEART: regular rate & rhythm, no murmurs and no gallops ABDOMEN:abdomen soft, non-tender, normal bowel sounds and no masses or organomegaly BACK: Back symmetric, no curvature., No CVA tenderness EXTREMITIES:no joint deformities, effusion, or inflammation, no edema  NEURO: alert & oriented x 3 with fluent speech, no focal motor/sensory deficits  PERFORMANCE STATUS: ECOG 1  LABORATORY DATA: Lab Results  Component Value Date   WBC 7.1 02/19/2018   HGB 11.6 (L) 02/19/2018   HCT 35.0 (L) 02/19/2018   MCV 100.6 (H) 02/19/2018   PLT 211 02/19/2018      Chemistry      Component Value Date/Time   NA 140 02/19/2018 1551   K 3.6 02/19/2018 1551   CL 102 02/19/2018 1551   CO2 28 02/19/2018 1551   BUN 11 02/19/2018 1551   CREATININE 1.14 02/19/2018 1551      Component Value Date/Time   CALCIUM 9.4 02/19/2018 1551   ALKPHOS 62 02/19/2018 1551   AST 15 02/19/2018 1551   ALT 14 02/19/2018 1551   BILITOT 0.3 02/19/2018 1551       RADIOGRAPHIC STUDIES: No results found.  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   PLAN: I had a lengthy discussion with the patient and his wife today about his current condition and further investigation to confirm diagnosis.  He ordered myeloma panel which was performed yesterday and some of the results are available today including repeat CBC, comprehensive metabolic panel which showed persistent elevation of the serum protein of 10.0 as well as quantitative immunoglobulin which showed significant elevation of IgG 4905 and low IgA of 29 and IgM of 10.  Free light chains are still pending. I discussed with the patient the suspicious diagnosis of multiple myeloma.  I recommended for him to have a skeletal bone survey as well as a bone marrow biopsy and aspirate performed in the next 1-2 weeks. I will arrange for the patient to come back for follow-up visit in 2-3 weeks for reevaluation and discussion of his biopsy results as well as the imaging studies. After the final work-up confirmed the diagnosis of multiple myeloma, I will discuss with the patient his treatment plans at that time. For anxiety, I started the patient on Xanax 0.25 mg p.o. daily as needed. He was advised to call immediately if he has   any other concerning symptoms in the interval.  The patient voices understanding of current disease status and treatment options and is in agreement with the current care plan.  All questions were answered. The patient knows to call the clinic with any problems, questions or concerns. We can certainly see the patient much sooner if necessary.  Thank you so much for allowing me to participate in the care of Matthew Yates. I will continue to follow up the patient with you and assist in his care.  I spent 40 minutes counseling the patient face to face. The total time spent in the appointment was 60 minutes.  Disclaimer: This note was dictated with voice recognition software. Similar sounding words can inadvertently be transcribed and may not be  corrected upon review.   Mohamed K Mohamed February 20, 2018, 9:26 AM   

## 2018-02-20 NOTE — Patient Instructions (Signed)
Multiple Myeloma  Multiple myeloma is a form of cancer. It develops when abnormal plasma cells grow out of control. Plasma cells are a type of white blood cell that is made in the soft tissue inside the bones (bone marrow). They are part of the body's disease-fighting system (immune system). Multiple myeloma damages bones and causes other health problems because of its effect on blood cells. Abnormal plasma cells produce monoclonal proteins (M proteins) and interfere with many important functions that normal cells perform in the body. The disease gets worse over time (progresses) and reduces the body's ability to fight infections. What are the causes? The cause of multiple myeloma is not known. What increases the risk? You are more likely to develop this condition if you:  Are older than age 65.  Are male.  Are African American.  Have a family history of multiple myeloma.  Have a history of monoclonal gammopathy of undetermined significance (MGUS).  Have a history of radiation exposure.  Have been exposed to certain chemicals, such as benzene or pesticides. What are the signs or symptoms? Signs and symptoms of multiple myeloma may include:  Bone pain, especially in the back, ribs, and hips.  Broken bones (fractures).  Having a low level of red blood cells (anemia), white blood cells (leukopenia), and platelets (thrombocytopenia). Platelets are cells that help blood to clot so a wound does not keep bleeding.  Fatigue.  Weakness.  Infections.  Unusual bleeding, such as: ? Bleeding from the nose or gums. ? Bleeding a lot from a small scrape or cut.  High blood calcium levels.  Increased urination.  Confusion.  Shortness of breath.  Weakness or numbness in your legs.  Sudden, severe back pain. How is this diagnosed? This condition is diagnosed based on your symptoms, your medical history, and a physical exam. You will have blood and urine tests to confirm that M  proteins are present. You may also have other tests, including:  Additional blood tests.  X-rays.  MRI.  CT scan.  PET scan.  Tests to check the function of your kidneys.  Heart tests, such as an echocardiogram. An echocardiogram uses sound waves to produce an image of the heart.  A procedure to remove a sample of bone marrow (bone marrow biopsy). The sample is examined for abnormal plasma cells. How is this treated? There is no cure for multiple myeloma. However, treatments can manage symptoms and slow the progression of the disease. Treatment options may vary depending on how much the disease has advanced. Possible treatment options may include:  Medicines that kill cancer cells (chemotherapy).  Radiation therapy. This is the use of high-energy rays to kill cancer cells.  A bone marrow transplant. This procedure replaces diseased bone marrow with healthy bone marrow (stem cell transplant).  Medicines that block the growth and spread of cancer cells (targeted drug therapy).  Medicines that strengthen your immune system's ability to fight cancer cells (immunotherapy or biologic therapy).  Participating in clinical trials to find out if new (experimental) treatments are effective.  Medicines that help to prevent bone damage (bisphosphonates).  Medicines that reduce swelling (corticosteroids).  Surgery to repair bone damage.  A procedure to remove plasma cells from your blood (plasmapheresis).  Other medicines to treat problems such as infections or pain. Follow these instructions at home:  Eating and drinking  Drink enough fluid to keep your urine pale yellow.  Try to eat healthy meals on a regular basis. Some of your treatments might affect your   appetite. If you are having problems eating or if you do not have an appetite, meet with a diet and nutrition specialist (dietitian).  Take vitamins or supplements only as told by your health care provider or dietitian. Some  vitamins and supplements may interfere with how well your treatment works. General instructions  Take over-the-counter and prescription medicines only as told by your health care provider.  Stay active. Talk with your health care provider about what types of exercises and activities are safe for you. ? Avoid activities that cause increased pain. ? Do not lift anything that is heavier than 10 lb (4.5 kg), or the limit that you are told, until your health care provider says that it is safe.  Consider joining a support group or getting counseling to help you cope with the stress of having multiple myeloma.  Keep all follow-up visits as told by your health care provider. This is important. Where to find more information  American Cancer Society: www.cancer.org  Leukemia and Lymphoma Society: www.LLS.org  National Cancer Institute (NCI): www.cancer.gov Contact a health care provider if you:  Have pain that gets worse or does not get better with medicine.  Have a fever.  Have swollen legs.  Have weakness or dizziness.  Have unexplained weight loss.  Have unexplained bleeding or bruising.  Have a cough or symptoms of the common cold.  Feel depressed.  Have changes in urination or bowel movements. Get help right away if you:  Have sudden severe pain, especially back pain.  Have numbness or weakness in your arms, hands, legs, or feet.  Become very confused.  Have weakness on one side of your body.  Have slurred speech.  Have trouble staying awake.  Have shortness of breath.  Have blood in your stool (feces) or urine.  Vomit blood or cough up blood. Summary  Multiple myeloma is a form of cancer. It develops when abnormal plasma cells grow out of control.  There is no cure for multiple myeloma. However, treatments can manage symptoms and slow the progression of the disease. Treatment options may vary depending on how much the disease has advanced.  Do not lift  anything that is heavier than 10 lb (4.5 kg), or the limit that you are told, until your health care provider says that it is safe.  Contact your health care provider if you have any new symptoms or sudden severe pain, especially back pain. This information is not intended to replace advice given to you by your health care provider. Make sure you discuss any questions you have with your health care provider. Document Released: 10/15/2000 Document Revised: 11/19/2016 Document Reviewed: 11/19/2016 Elsevier Interactive Patient Education  2019 [employer] Monoclonal Gammopathy of Undetermined Significance (MGUS) Monoclonal gammopathy of undetermined significance (MGUS) is a condition in which there is too much of a protein called monoclonal protein, or M protein, in the blood. MGUS can cause you to have too many cells in your blood and not enough space for healthy cells. This condition may increase your risk of developing multiple myeloma or other blood disorders in the future. What are the causes? The cause of this condition is not known. What increases the risk? You are more likely to develop this condition if:  You are African American.  You are age 50 or older.  You are male.  You have an autoimmune disease.  You have been exposed to radiation.  You have a family history of MGUS. What are the signs or symptoms? There   are no symptoms of this condition. How is this diagnosed?  This condition may be diagnosed with a blood test that checks for M protein. How is this treated? Treatment for this condition may involve:  Having regular exams. This will allow your health care provider to monitor your health.  Having tests done regularly, such as: ? Blood tests to check for M protein in your body. ? Imaging tests, such as a CT scan. ? A bone marrow biopsy. This test involves taking a sample of bone marrow from your body so it can be looked at under a microscope. Follow these  instructions at home:  Keep all follow-up visits as told by your health care provider. This is important. Contact a health care provider if:  You have trouble swallowing.  You have pain in your back or ribs.  You have a fever.  You are bruising easily. Get help right away if:  You break a bone.  You have trouble breathing. Summary  Monoclonal gammopathy of undetermined significance (MGUS) is a condition in which there is too much of a protein called monoclonal protein, or M protein, in the blood.  This condition may be diagnosed with a blood test that checks for M protein.  Treatment for this condition may involve having tests done regularly. Tests may include blood tests, imaging tests, and a bone marrow biopsy. This information is not intended to replace advice given to you by your health care provider. Make sure you discuss any questions you have with your health care provider. Document Released: 12/12/2015 Document Revised: 12/12/2015 Document Reviewed: 12/12/2015 Elsevier Interactive Patient Education  2019 [employer].

## 2018-02-21 LAB — BETA 2 MICROGLOBULIN, SERUM: Beta-2 Microglobulin: 2.5 mg/L — ABNORMAL HIGH (ref 0.6–2.4)

## 2018-02-22 LAB — KAPPA/LAMBDA LIGHT CHAINS
Kappa free light chain: 830.2 mg/L — ABNORMAL HIGH (ref 3.3–19.4)
Kappa, lambda light chain ratio: 296.5 — ABNORMAL HIGH (ref 0.26–1.65)
Lambda free light chains: 2.8 mg/L — ABNORMAL LOW (ref 5.7–26.3)

## 2018-02-25 ENCOUNTER — Other Ambulatory Visit: Payer: Self-pay | Admitting: *Deleted

## 2018-02-25 DIAGNOSIS — C9000 Multiple myeloma not having achieved remission: Secondary | ICD-10-CM

## 2018-02-26 ENCOUNTER — Ambulatory Visit (HOSPITAL_COMMUNITY)
Admission: RE | Admit: 2018-02-26 | Discharge: 2018-02-26 | Disposition: A | Discharge status: Home / Self Care | Payer: BLUE CROSS/BLUE SHIELD | Source: Ambulatory Visit | Attending: Internal Medicine | Admitting: Internal Medicine

## 2018-02-26 ENCOUNTER — Inpatient Hospital Stay (HOSPITAL_BASED_OUTPATIENT_CLINIC_OR_DEPARTMENT_OTHER): Payer: BLUE CROSS/BLUE SHIELD | Admitting: Adult Health

## 2018-02-26 ENCOUNTER — Inpatient Hospital Stay: Payer: BLUE CROSS/BLUE SHIELD

## 2018-02-26 DIAGNOSIS — C9000 Multiple myeloma not having achieved remission: Secondary | ICD-10-CM

## 2018-02-26 LAB — CBC WITH DIFFERENTIAL (CANCER CENTER ONLY)
Abs Immature Granulocytes: 0.02 10*3/uL (ref 0.00–0.07)
Basophils Absolute: 0 10*3/uL (ref 0.0–0.1)
Basophils Relative: 0 %
Eosinophils Absolute: 0 10*3/uL (ref 0.0–0.5)
Eosinophils Relative: 1 %
HCT: 35.4 % — ABNORMAL LOW (ref 39.0–52.0)
Hemoglobin: 11.7 g/dL — ABNORMAL LOW (ref 13.0–17.0)
Immature Granulocytes: 0 %
Lymphocytes Relative: 52 %
Lymphs Abs: 2.8 10*3/uL (ref 0.7–4.0)
MCH: 33.5 pg (ref 26.0–34.0)
MCHC: 33.1 g/dL (ref 30.0–36.0)
MCV: 101.4 fL — ABNORMAL HIGH (ref 80.0–100.0)
Monocytes Absolute: 0.6 10*3/uL (ref 0.1–1.0)
Monocytes Relative: 11 %
Neutro Abs: 2 10*3/uL (ref 1.7–7.7)
Neutrophils Relative %: 36 %
Platelet Count: 218 10*3/uL (ref 150–400)
RBC: 3.49 MIL/uL — ABNORMAL LOW (ref 4.22–5.81)
RDW: 14.6 % (ref 11.5–15.5)
WBC Count: 5.4 10*3/uL (ref 4.0–10.5)
nRBC: 0 % (ref 0.0–0.2)

## 2018-02-26 MED ORDER — LIDOCAINE HCL 2 % IJ SOLN
INTRAMUSCULAR | Status: AC
  Filled 2018-02-26: qty 20

## 2018-02-26 NOTE — Patient Instructions (Signed)

## 2018-02-26 NOTE — Progress Notes (Signed)
Post procedure (bone marrow aspiration) patient lied flat for 30 minutes. Vitals signs obtained and without concern. Site checked and no signs of bleeding noted. Patient re-instructed on post procedure instructions and care. Patient verbalized understanding and sent to lab for post procedure blood work.

## 2018-02-26 NOTE — Progress Notes (Signed)
INDICATION: Multiple Myeloma  Bone Marrow Biopsy and Aspiration Procedure Note   Informed consent was obtained and potential risks including bleeding, infection and pain were reviewed with the patient.  The patient's name, date of birth, identification, consent and allergies were verified prior to the start of procedure and time out was performed.  The left posterior iliac crest was chosen as the site of biopsy.  The skin was prepped with ChloraPrep.   16 cc of 2% lidocaine was used to provide local anaesthesia.   10 cc of bone marrow aspirate was obtained followed by 1.8cm biopsy.  Pressure was applied to the biopsy site and bandage was placed over the biopsy site. Patient was made to lie on the back for 15 mins prior to discharge.  The procedure was tolerated well. COMPLICATIONS: None BLOOD LOSS: none The patient was discharged home in stable condition with a 2 week follow up to review results.  Patient was provided with post bone marrow biopsy instructions and instructed to call if there was any bleeding or worsening pain.  Specimens sent for flow cytometry, cytogenetics and additional studies.  Signed Lindsey C Causey, NP   

## 2018-03-09 ENCOUNTER — Encounter (HOSPITAL_COMMUNITY): Payer: Self-pay | Admitting: Internal Medicine

## 2018-03-15 ENCOUNTER — Other Ambulatory Visit: Payer: Self-pay | Admitting: *Deleted

## 2018-03-15 DIAGNOSIS — C9000 Multiple myeloma not having achieved remission: Secondary | ICD-10-CM

## 2018-03-16 ENCOUNTER — Telehealth: Payer: Self-pay | Admitting: Medical Oncology

## 2018-03-16 ENCOUNTER — Other Ambulatory Visit: Payer: BLUE CROSS/BLUE SHIELD

## 2018-03-16 ENCOUNTER — Inpatient Hospital Stay (HOSPITAL_BASED_OUTPATIENT_CLINIC_OR_DEPARTMENT_OTHER): Payer: BLUE CROSS/BLUE SHIELD | Admitting: Internal Medicine

## 2018-03-16 ENCOUNTER — Inpatient Hospital Stay: Payer: BLUE CROSS/BLUE SHIELD | Attending: Internal Medicine

## 2018-03-16 ENCOUNTER — Ambulatory Visit: Payer: BLUE CROSS/BLUE SHIELD | Admitting: Internal Medicine

## 2018-03-16 ENCOUNTER — Encounter: Payer: Self-pay | Admitting: Internal Medicine

## 2018-03-16 VITALS — BP 158/83 | HR 83 | Temp 98.2°F | Resp 18 | Ht 75.0 in | Wt 259.9 lb

## 2018-03-16 DIAGNOSIS — Z79899 Other long term (current) drug therapy: Secondary | ICD-10-CM | POA: Insufficient documentation

## 2018-03-16 DIAGNOSIS — C9000 Multiple myeloma not having achieved remission: Secondary | ICD-10-CM

## 2018-03-16 DIAGNOSIS — Z791 Long term (current) use of non-steroidal anti-inflammatories (NSAID): Secondary | ICD-10-CM | POA: Insufficient documentation

## 2018-03-16 DIAGNOSIS — F419 Anxiety disorder, unspecified: Secondary | ICD-10-CM

## 2018-03-16 DIAGNOSIS — Z5111 Encounter for antineoplastic chemotherapy: Secondary | ICD-10-CM | POA: Insufficient documentation

## 2018-03-16 DIAGNOSIS — Z7189 Other specified counseling: Secondary | ICD-10-CM | POA: Insufficient documentation

## 2018-03-16 DIAGNOSIS — I10 Essential (primary) hypertension: Secondary | ICD-10-CM | POA: Insufficient documentation

## 2018-03-16 DIAGNOSIS — D649 Anemia, unspecified: Secondary | ICD-10-CM | POA: Insufficient documentation

## 2018-03-16 LAB — CMP (CANCER CENTER ONLY)
ALT: 15 U/L (ref 0–44)
AST: 16 U/L (ref 15–41)
Albumin: 3.1 g/dL — ABNORMAL LOW (ref 3.5–5.0)
Alkaline Phosphatase: 59 U/L (ref 38–126)
Anion gap: 7 (ref 5–15)
BUN: 14 mg/dL (ref 8–23)
CO2: 28 mmol/L (ref 22–32)
Calcium: 9.5 mg/dL (ref 8.9–10.3)
Chloride: 104 mmol/L (ref 98–111)
Creatinine: 1.12 mg/dL (ref 0.61–1.24)
GFR, Est AFR Am: >60 mL/min (ref >60)
GFR, Estimated: >60 mL/min (ref >60)
Glucose, Bld: 98 mg/dL (ref 70–99)
Potassium: 4.2 mmol/L (ref 3.5–5.1)
Sodium: 139 mmol/L (ref 135–145)
Total Bilirubin: 0.2 mg/dL — ABNORMAL LOW (ref 0.3–1.2)
Total Protein: 9.2 g/dL — ABNORMAL HIGH (ref 6.5–8.1)

## 2018-03-16 LAB — CBC WITH DIFFERENTIAL (CANCER CENTER ONLY)
Abs Immature Granulocytes: 0.02 10*3/uL (ref 0.00–0.07)
Basophils Absolute: 0 10*3/uL (ref 0.0–0.1)
Basophils Relative: 0 %
Eosinophils Absolute: 0.1 10*3/uL (ref 0.0–0.5)
Eosinophils Relative: 2 %
HCT: 34.1 % — ABNORMAL LOW (ref 39.0–52.0)
Hemoglobin: 11.5 g/dL — ABNORMAL LOW (ref 13.0–17.0)
Immature Granulocytes: 0 %
Lymphocytes Relative: 55 %
Lymphs Abs: 3.3 10*3/uL (ref 0.7–4.0)
MCH: 33.6 pg (ref 26.0–34.0)
MCHC: 33.7 g/dL (ref 30.0–36.0)
MCV: 99.7 fL (ref 80.0–100.0)
Monocytes Absolute: 0.6 10*3/uL (ref 0.1–1.0)
Monocytes Relative: 10 %
Neutro Abs: 2 10*3/uL (ref 1.7–7.7)
Neutrophils Relative %: 33 %
Platelet Count: 162 10*3/uL (ref 150–400)
RBC: 3.42 MIL/uL — ABNORMAL LOW (ref 4.22–5.81)
RDW: 14.1 % (ref 11.5–15.5)
WBC Count: 6 10*3/uL (ref 4.0–10.5)
nRBC: 0 % (ref 0.0–0.2)

## 2018-03-16 MED ORDER — DEXAMETHASONE 4 MG PO TABS: ORAL_TABLET | ORAL | 2 refills | Status: DC

## 2018-03-16 MED ORDER — LENALIDOMIDE 25 MG PO CAPS: 25.0000 mg | ORAL_CAPSULE | Freq: Every day | ORAL | 0 refills | Status: DC

## 2018-03-16 MED ORDER — ACYCLOVIR 200 MG PO CAPS: 200.0000 mg | ORAL_CAPSULE | Freq: Two times a day (BID) | ORAL | 2 refills | Status: DC

## 2018-03-16 MED ORDER — WARFARIN SODIUM 2 MG PO TABS: 2.0000 mg | ORAL_TABLET | Freq: Every day | ORAL | 1 refills | Status: DC

## 2018-03-16 NOTE — Progress Notes (Signed)
[facility] [phone]   Fax:([phone]  OFFICE PROGRESS NOTE  Derinda Late, MD [phone] [facility] [place] [address]  DIAGNOSIS: Multiple myeloma, IgG subtype diagnosed in January 2020.  PRIOR THERAPY: None  CURRENT THERAPY: Systemic chemotherapy with Velcade 1.3 mg/M2 on days 1, 8, 15 as well as Revlimid 25 mg p.o. daily for 21 days every 4 weeks as well as Decadron 40 mg weekly.  First dose expected March 22, 2018  INTERVAL HISTORY: Matthew Yates 68 y.o. male returns to the clinic today for follow-up visit accompanied by his wife.  The patient is feeling fine today with no concerning complaints except for anxiety and mild fatigue and low back pain.  He was seen for initial evaluation few weeks ago with elevated total protein.  Several studies were performed since that time including myeloma panel that showed significant elevation of IgG of 4905 with low IgA and IgM.  The patient also has elevated free kappa light chain of 830.2 with a kappa/lambda ratio of 296.50.  He underwent a bone marrow biopsy and aspirate and the final pathology was consistent with plasma cell neoplasm with 47% plasma cells in the bone marrow.  There was no cytogenetics abnormalities.  The patient also had skeletal bone survey that was unremarkable for any lytic or blastic lesions.  He is here today for evaluation and discussion of his treatment options.  He has no current chest pain, shortness of breath, cough or hemoptysis.  He denied having any fever or chills.  He has no nausea, vomiting, diarrhea or constipation.  He has no significant weight loss or night sweats.  MEDICAL HISTORY: Past Medical History:  Diagnosis Date  . Acute medial meniscus tear of left knee   . Arthritis    lt knee  . GERD (gastroesophageal reflux disease)   . Hypertension     ALLERGIES:  is allergic to other.  MEDICATIONS:  Current  Outpatient Medications  Medication Sig Dispense Refill  . ALPRAZolam (XANAX) 0.25 MG tablet Take 1 tablet (0.25 mg total) by mouth at bedtime as needed for anxiety. 30 tablet 0  . amLODipine (NORVASC) 5 MG tablet Take by mouth.    . esomeprazole (NEXIUM) 20 MG capsule Take 1 capsule (20 mg total) by mouth daily before breakfast. 30 capsule 1  . HYDROcodone-acetaminophen (NORCO) 7.5-325 MG tablet Take 1-2 tablets by mouth every 6 (six) hours as needed for moderate pain. 90 tablet 0  . ibuprofen (ADVIL,MOTRIN) 400 MG tablet Take 400 mg by mouth every 6 (six) hours as needed.    . loratadine-pseudoephedrine (CLARITIN-D 12-HOUR) 5-120 MG tablet Take 1 tablet by mouth 2 (two) times daily.    Marland Kitchen LORazepam (ATIVAN) 0.5 MG tablet Take by mouth.    . ondansetron (ZOFRAN) 4 MG tablet Take 1-2 tablets (4-8 mg total) by mouth every 8 (eight) hours as needed for nausea or vomiting. 40 tablet 0  . senna-docusate (SENOKOT S) 8.6-50 MG tablet Take 1 tablet by mouth at bedtime as needed. 30 tablet 1   No current facility-administered medications for this visit.     SURGICAL HISTORY:  Past Surgical History:  Procedure Laterality Date  . CHONDROPLASTY Left 07/09/2016   Procedure: CHONDROPLASTY with MICROFRACTURE;  Surgeon: Leandrew Koyanagi, MD;  Location: [place];  Service: Orthopedics;  Laterality: Left;  . COLONOSCOPY     x2  . HIP SURGERY  aspiration only, no anesthesia  . KNEE ARTHROSCOPY WITH MEDIAL MENISECTOMY Left 07/09/2016   Procedure: LEFT KNEE ARTHROSCOPY WITH PARTIAL MEDIAL MENISCECTOMY;  Surgeon: Leandrew Koyanagi, MD;  Location: [place];  Service: Orthopedics;  Laterality: Left;    REVIEW OF SYSTEMS:  Constitutional: positive for fatigue Eyes: negative Ears, nose, mouth, throat, and face: negative Respiratory: negative Cardiovascular: negative Gastrointestinal: negative Genitourinary:negative Integument/breast: negative Hematologic/lymphatic:  negative Musculoskeletal:positive for back pain Neurological: negative Behavioral/Psych: positive for anxiety Endocrine: negative Allergic/Immunologic: negative   PHYSICAL EXAMINATION: General appearance: alert, cooperative, fatigued and no distress Head: Normocephalic, without obvious abnormality, atraumatic Neck: no adenopathy, no JVD, supple, symmetrical, trachea midline and thyroid not enlarged, symmetric, no tenderness/mass/nodules Lymph nodes: Cervical, supraclavicular, and axillary nodes normal. Resp: clear to auscultation bilaterally Back: symmetric, no curvature. ROM normal. No CVA tenderness. Cardio: regular rate and rhythm, S1, S2 normal, no murmur, click, rub or gallop GI: soft, non-tender; bowel sounds normal; no masses,  no organomegaly Extremities: extremities normal, atraumatic, no cyanosis or edema Neurologic: Alert and oriented X 3, normal strength and tone. Normal symmetric reflexes. Normal coordination and gait  ECOG PERFORMANCE STATUS: 1 - Symptomatic but completely ambulatory  Blood pressure (!) 158/83, pulse 83, temperature 98.2 F (36.8 C), temperature source Oral, resp. rate 18, height 6' 3"  (1.905 m), weight 259 lb 14.4 oz (117.9 kg), SpO2 98 %.  LABORATORY DATA: Lab Results  Component Value Date   WBC 6.0 03/16/2018   HGB 11.5 (L) 03/16/2018   HCT 34.1 (L) 03/16/2018   MCV 99.7 03/16/2018   PLT 162 03/16/2018      Chemistry      Component Value Date/Time   NA 139 03/16/2018 0935   K 4.2 03/16/2018 0935   CL 104 03/16/2018 0935   CO2 28 03/16/2018 0935   BUN 14 03/16/2018 0935   CREATININE 1.12 03/16/2018 0935      Component Value Date/Time   CALCIUM 9.5 03/16/2018 0935   ALKPHOS 59 03/16/2018 0935   AST 16 03/16/2018 0935   ALT 15 03/16/2018 0935   BILITOT 0.2 (L) 03/16/2018 0935       RADIOGRAPHIC STUDIES: Dg Bone Survey Met  Result Date: 02/26/2018 CLINICAL DATA:  Elevated serum protein.  Mild anemia. EXAM: METASTATIC BONE SURVEY  COMPARISON:  None. FINDINGS: No convincing lytic or blastic lesions seen throughout the visualized bones. Calcifications in the soft tissue of the neck suggest mild calcified atherosclerotic change in the carotids. Mild degenerative changes in the thoracic spine. Degenerative changes in the medial and lateral compartments of both knees. Mild degenerative disc disease in the lumbar spine. IMPRESSION: 1. No lytic or blastic lesions identified within the visualized bones. Electronically Signed   By: Dorise Bullion III M.D   On: 02/26/2018 14:33    ASSESSMENT AND PLAN: This is a very pleasant 68 years old African-American male recently diagnosed with multiple myeloma, IgG subtype. I had a lengthy discussion with the patient and his wife today about his current disease status and treatment options. The patient has endorgan involvement with persistent anemia but no significant renal insufficiency. I discussed with the patient the options of his treatment including induction systemic chemotherapy with Velcade 1.3 mg/M2 on days 1, 8, 15 every 4 weeks in addition to Revlimid 25 mg p.o. daily for 21 days every 4 weeks as well as weekly Decadron 40 mg orally. I discussed with the patient the adverse effect of this treatment and he will have a chemotherapy education class as well  as meeting with the pharmacist for oral oncolytic for more information about this treatment. He is expected to start the first dose of this treatment on March 22, 2018. I will start the patient on prophylactic dose of Coumadin 2 mg p.o. daily. I will also start the patient on prophylactic dose of acyclovir 200 mg p.o. twice daily. I will see the patient back for follow-up visit in 3 weeks for evaluation and management of any adverse effect of his treatment. After the initial 4-6 cycles of this treatment, the patient will be referred to a tertiary center for evaluation and discussion of autologous stem cell transplant. For anxiety, he  will continue his current treatment with Xanax as needed. The patient was advised to call immediately if he has any concerning symptoms in the interval. The patient voices understanding of current disease status and treatment options and is in agreement with the current care plan.  All questions were answered. The patient knows to call the clinic with any problems, questions or concerns. We can certainly see the patient much sooner if necessary.  I spent 20 minutes counseling the patient face to face. The total time spent in the appointment was 30 minutes.  Disclaimer: This note was dictated with voice recognition software. Similar sounding words can inadvertently be transcribed and may not be corrected upon review.

## 2018-03-16 NOTE — Progress Notes (Signed)
START ON PATHWAY REGIMEN - Multiple Myeloma and Other Plasma Cell Dyscrasias     A cycle is every 21 days:     Bortezomib      Lenalidomide      Dexamethasone   **Always confirm dose/schedule in your pharmacy ordering system**  Patient Characteristics: Newly Diagnosed, Transplant Eligible, Standard Risk R-ISS Staging: I Disease Classification: Newly Diagnosed Is Patient Eligible for Transplant<= Transplant Eligible Risk Status: Standard Risk Intent of Therapy: Curative Intent, Discussed with Patient

## 2018-03-16 NOTE — Telephone Encounter (Signed)
Pt enrolled in online revlimid risk assessment program. I reviewed pt responsibilities . Printed rx from [employer] and ready for MD signature. Not sure what pharmacy to send to -pending [facility] input.

## 2018-03-17 ENCOUNTER — Telehealth: Payer: Self-pay | Admitting: Internal Medicine

## 2018-03-17 NOTE — Telephone Encounter (Signed)
Scheduled appt per 2/11 los - called pt /unable to reach . Left message with appt date and time

## 2018-03-18 ENCOUNTER — Telehealth: Payer: Self-pay | Admitting: Pharmacist

## 2018-03-18 ENCOUNTER — Other Ambulatory Visit: Payer: Self-pay | Admitting: Pharmacist

## 2018-03-18 DIAGNOSIS — C9000 Multiple myeloma not having achieved remission: Secondary | ICD-10-CM

## 2018-03-18 MED ORDER — LENALIDOMIDE 25 MG PO CAPS: 25.0000 mg | ORAL_CAPSULE | Freq: Every day | ORAL | 0 refills | Status: DC

## 2018-03-18 NOTE — Telephone Encounter (Signed)
Oral Oncology Pharmacist Encounter  Prior authorization for Revlimid 25mg  capsules submitted to [facility] of  on Cover [email or web address]  Key: [id number] Status is approved  Johny Drilling, PharmD, BCPS, BCOP  03/18/2018 9:43 AM Oral Oncology Clinic [phone]

## 2018-03-18 NOTE — Telephone Encounter (Signed)
Oral Oncology Pharmacist Encounter  Insurance authorization for Revlimid has been approved.  Revlimid prescription has been E scribed to [facility] in [place], [place].  I have also faxed supporting information such as demographics, insurance card, insurance authorization, and medication list to dispensing pharmacy at [phone].  I will follow-up with dispensing pharmacy later today to ensure that they have prescription and will be contacting patient in a timely manner.  Johny Drilling, PharmD, BCPS, BCOP  03/18/2018 11:13 AM Oral Oncology Clinic [phone]

## 2018-03-18 NOTE — Telephone Encounter (Signed)
Oral Chemotherapy Pharmacist Encounter   I spoke with patient for overview of: Revlimid (lenalidomide) for the treatment of newly diagnosed multiple myeloma not having achieved remission in conjunction with Velcade and dexamethasone, planned duration 4-6 cycles and then reassessment.   Counseled patient on administration, dosing, side effects, monitoring, drug-food interactions, safe handling, storage, and disposal.  Patient will take Revlimid 38m capsules, 1 capsule by mouth once daily, without regard to food, with a full glass of water.  Revlimid will be given 21 days on, 7 days off, repeat every 28 days.  Patient will take dexamethasone 482mtablets, 10 tablets (4044mby mouth once weekly with breakfast. Patient will take dexamethasone on Mondays.  Velcade will be administered at 1.3 mg/m SQ, given here at the office, on days 1, 8, and 15 of each 28-day cycle  Revlimid start date: Planned for 03/22/2018  Adverse effects of Revlimid include but are not limited to: nausea, constipation, diarrhea, abdominal pain, rash, fatigue, drug fever, and decreased blood counts.    Reviewed with patient importance of keeping a medication schedule and plan for any missed doses.  Mr. LeoEldredgeiced understanding and appreciation.   All questions answered. Medication reconciliation performed and medication/allergy list updated.  Patient has already picked up the prescriptions for dexamethasone, acyclovir, and warfarin from local [facility]. We discussed that warfarin will be used for thromboprophylaxis and that acyclovir will be used for prophylaxis from shingles reactivation. Patient understands that dexamethasone is part of actual treatment regimen.  Dr. MohWorthy Flankte states that patient will receive Velcade once weekly for 3 weeks on, one-week off, however patient has been scheduled for Velcade every week. Velcade treatment plan has been entered for patient to receive it every week. I left voicemail  for collaborative practice RN to address Velcade orders with Dr. MohEarlie Serverd then have patient's infusion schedule amended to the correct dosing schedule.  Patient will receive chemotherapy education class on 03/19/2018 at 4 PM.  Patient informed that Revlimid prescription will come from [facility] mail order pharmacy, per insurance requirement. Dispensing pharmacy will be contacting patient to deliver copayment information and to schedule shipment of the Revlimid. Patient informed that he is eligible for manufacturer co-pay coupon that will reduce his out-of-pocket expense for Revlimid to $25 with each fill if his reported co-pay is higher than that.  Patient knows to call the office with questions or concerns. Oral Oncology Clinic will continue to follow.  JesJohny DrillingharmD, BCPS, BCOP  03/18/2018    4:09 PM Oral Oncology Clinic [phone]

## 2018-03-18 NOTE — Telephone Encounter (Signed)
Oral Oncology Pharmacist Encounter  I called [facility] at [phone] to follow-up on status of patient's Revlimid prescription. When they process patient's Revlimid prescription it is stating that prescription must be filled at [facility].  Prescription for Revlimid 25 mg capsules, take 1 capsule by mouth once daily for 21 days on, 7 days off, repeated every 28 days, quantity #21, refills = 0, has been E scribed to [facility] in [place], [place].  I will follow-up with dispensing pharmacy tomorrow to ensure that they have everything that is required from the office so that patient can receive his medication as soon as possible.  Johny Drilling, PharmD, BCPS, BCOP  03/18/2018 4:10 PM Oral Oncology Clinic [phone]

## 2018-03-18 NOTE — Telephone Encounter (Signed)
Oral Oncology Pharmacist Encounter  Received new prescription for Revlimid (lenalidomide) for the treatment of newly diagnosed multiple myeloma not having achieved remission in conjunction with Velcade and dexamethasone, planned duration 4-6 cycles and then reassessment.  Labs from 03/16/18 assessed, OK for treatment. SCr=1.12, est CrCl ~ 100 mL/min  Revlimid is planned to be dosed at 57m by mouth once daily for 21 days on, 7 days off, repeated every 28 days  Velcade is planned to be dosed at 1.3 mg/m2 SQ on days 1, 8, and 15 of each 28 days cycle  Dexamethasone is planned to be administered at 442mby mouth once weekly  Current medication list in Epic reviewed, no DDIs with Revlimid identified.  Prescription for Revlimid will be e-scribed to appropriate specialty pharmacy for dispensing once insurance authorization is approved. Rev[facility] it is a limited distribution medication.  Oral Oncology Clinic will continue to follow for insurance authorization, copayment issues, initial counseling and start date.  Matthew DrillingPharmD, BCPS, BCOP  03/18/2018 9:17 AM Oral Oncology Clinic [phone]

## 2018-03-19 ENCOUNTER — Inpatient Hospital Stay: Payer: BLUE CROSS/BLUE SHIELD

## 2018-03-19 NOTE — Telephone Encounter (Signed)
[name] Encounter  I followed up with [facility] on the status of Revlimid. [facility] stated that the prescription is currently in the processing stage and will be expedited. No copay information yet but that I can try back this afternoon.  [place] Patient [place] Phone [phone] Fax [phone]

## 2018-03-22 ENCOUNTER — Inpatient Hospital Stay: Payer: BLUE CROSS/BLUE SHIELD

## 2018-03-22 ENCOUNTER — Other Ambulatory Visit: Payer: Self-pay | Admitting: Medical Oncology

## 2018-03-22 VITALS — BP 150/68 | HR 85 | Temp 98.3°F | Resp 18

## 2018-03-22 DIAGNOSIS — C9000 Multiple myeloma not having achieved remission: Secondary | ICD-10-CM

## 2018-03-22 LAB — CBC WITH DIFFERENTIAL (CANCER CENTER ONLY)
Abs Immature Granulocytes: 0.04 10*3/uL (ref 0.00–0.07)
Basophils Absolute: 0 10*3/uL (ref 0.0–0.1)
Basophils Relative: 0 %
Eosinophils Absolute: 0.1 10*3/uL (ref 0.0–0.5)
Eosinophils Relative: 1 %
HCT: 36 % — ABNORMAL LOW (ref 39.0–52.0)
Hemoglobin: 11.8 g/dL — ABNORMAL LOW (ref 13.0–17.0)
Immature Granulocytes: 1 %
Lymphocytes Relative: 58 %
Lymphs Abs: 4.5 10*3/uL — ABNORMAL HIGH (ref 0.7–4.0)
MCH: 33.4 pg (ref 26.0–34.0)
MCHC: 32.8 g/dL (ref 30.0–36.0)
MCV: 102 fL — ABNORMAL HIGH (ref 80.0–100.0)
Monocytes Absolute: 0.7 10*3/uL (ref 0.1–1.0)
Monocytes Relative: 9 %
Neutro Abs: 2.4 10*3/uL (ref 1.7–7.7)
Neutrophils Relative %: 31 %
Platelet Count: 186 10*3/uL (ref 150–400)
RBC: 3.53 MIL/uL — ABNORMAL LOW (ref 4.22–5.81)
RDW: 13.8 % (ref 11.5–15.5)
WBC Count: 7.7 10*3/uL (ref 4.0–10.5)
nRBC: 0 % (ref 0.0–0.2)

## 2018-03-22 LAB — CMP (CANCER CENTER ONLY)
ALT: 12 U/L (ref 0–44)
AST: 13 U/L — ABNORMAL LOW (ref 15–41)
Albumin: 3.1 g/dL — ABNORMAL LOW (ref 3.5–5.0)
Alkaline Phosphatase: 57 U/L (ref 38–126)
Anion gap: 10 (ref 5–15)
BUN: 14 mg/dL (ref 8–23)
CO2: 27 mmol/L (ref 22–32)
Calcium: 9.4 mg/dL (ref 8.9–10.3)
Chloride: 103 mmol/L (ref 98–111)
Creatinine: 1.31 mg/dL — ABNORMAL HIGH (ref 0.61–1.24)
GFR, Est AFR Am: >60 mL/min (ref >60)
GFR, Estimated: 57 mL/min — ABNORMAL LOW (ref >60)
Glucose, Bld: 102 mg/dL — ABNORMAL HIGH (ref 70–99)
Potassium: 3.8 mmol/L (ref 3.5–5.1)
Sodium: 140 mmol/L (ref 135–145)
Total Bilirubin: 0.2 mg/dL — ABNORMAL LOW (ref 0.3–1.2)
Total Protein: 9.6 g/dL — ABNORMAL HIGH (ref 6.5–8.1)

## 2018-03-22 MED ORDER — BORTEZOMIB CHEMO SQ INJECTION 3.5 MG (2.5MG/ML)
1.3000 mg/m2 | Freq: Once | INTRAMUSCULAR | Status: AC
  Administered 2018-03-22: 3.25 mg via SUBCUTANEOUS
  Filled 2018-03-22: qty 1.3

## 2018-03-22 MED ORDER — PROCHLORPERAZINE MALEATE 10 MG PO TABS: 10.0000 mg | ORAL_TABLET | Freq: Four times a day (QID) | ORAL | 0 refills | Status: DC | PRN

## 2018-03-22 MED ORDER — PROCHLORPERAZINE MALEATE 10 MG PO TABS
10.0000 mg | ORAL_TABLET | Freq: Once | ORAL | Status: AC
  Administered 2018-03-22: 10 mg via ORAL

## 2018-03-22 MED ORDER — PROCHLORPERAZINE MALEATE 10 MG PO TABS
ORAL_TABLET | ORAL | Status: AC
  Filled 2018-03-22: qty 1

## 2018-03-22 NOTE — Telephone Encounter (Signed)
[name] Encounter  I followed up with [facility] on the status of Revlimid. [facility] stated they will be calling the patient today to set up shipment. He has a $25 copay with copay assistance.  [place] Patient [place] Phone [phone] Fax [phone]

## 2018-03-22 NOTE — Patient Instructions (Signed)
[facility] Discharge Instructions for Patients Receiving Chemotherapy  Today you received the following chemotherapy agents :  Velcade.  To help prevent nausea and vomiting after your treatment, we encourage you to take your nausea medication as prescribed.  TAKE COMPAZINE 10 MG EVERY 6 HOURS AS NEEDED FOR NAUSEA.  DO NOT DRIVE AS IT CAN CAUSE DROWSINESS.    If you develop nausea and vomiting that is not controlled by your nausea medication, call the clinic.   BELOW ARE SYMPTOMS THAT SHOULD BE REPORTED IMMEDIATELY:  *FEVER GREATER THAN 100.5 F  *CHILLS WITH OR WITHOUT FEVER  NAUSEA AND VOMITING THAT IS NOT CONTROLLED WITH YOUR NAUSEA MEDICATION  *UNUSUAL SHORTNESS OF BREATH  *UNUSUAL BRUISING OR BLEEDING  TENDERNESS IN MOUTH AND THROAT WITH OR WITHOUT PRESENCE OF ULCERS  *URINARY PROBLEMS  *BOWEL PROBLEMS  UNUSUAL RASH Items with * indicate a potential emergency and should be followed up as soon as possible.  Feel free to call the clinic should you have any questions or concerns. The clinic phone number is [phone].  Please show the [place] at check-in to the Emergency Department and triage nurse.

## 2018-03-23 ENCOUNTER — Telehealth: Payer: Self-pay | Admitting: Medical Oncology

## 2018-03-23 ENCOUNTER — Telehealth: Payer: Self-pay

## 2018-03-23 NOTE — Telephone Encounter (Signed)
[name] Encounter  Patient called to let us know that the Revlimid was delivered to him today, 03/23/18 and he will start taking it tonight.  [place] Patient [place] Phone [phone] Fax [phone]

## 2018-03-23 NOTE — Telephone Encounter (Signed)
Revlimid --he will start his first dose tonight .

## 2018-03-23 NOTE — Telephone Encounter (Signed)
[name] Encounter  I received a voicemail from the patient stating that he received his Revlimid in the mail today, 03/23/18 and he will start taking it tonight, 03/23/18. The patient also said that he received a bill for $100. The bill was supposed to only be for $25 because he has copay assistance at [facility].  I called [facility] to confirm this and [facility] stated that the copay was $100 and the billing department did not bill the copay assistance. The representative I spoke with called the billing department to let them know this and said that the billing department will bill the copay assistance and his cost will only be $25. This process should take 1-2 business days and a new bill will be sent to the patient.  I called the patient to give him this information and had to leave a voicemail.  [place] Patient [place] Phone [phone] Fax [phone]

## 2018-03-29 ENCOUNTER — Encounter: Payer: Self-pay | Admitting: Internal Medicine

## 2018-03-29 ENCOUNTER — Ambulatory Visit: Payer: BLUE CROSS/BLUE SHIELD | Admitting: Internal Medicine

## 2018-03-29 ENCOUNTER — Other Ambulatory Visit: Payer: BLUE CROSS/BLUE SHIELD

## 2018-03-29 ENCOUNTER — Inpatient Hospital Stay: Payer: BLUE CROSS/BLUE SHIELD

## 2018-03-29 VITALS — BP 158/70 | HR 85 | Temp 98.6°F | Resp 18

## 2018-03-29 DIAGNOSIS — C9000 Multiple myeloma not having achieved remission: Secondary | ICD-10-CM

## 2018-03-29 LAB — CMP (CANCER CENTER ONLY)
ALT: 21 U/L (ref 0–44)
AST: 15 U/L (ref 15–41)
Albumin: 3.1 g/dL — ABNORMAL LOW (ref 3.5–5.0)
Alkaline Phosphatase: 62 U/L (ref 38–126)
Anion gap: 12 (ref 5–15)
BUN: 13 mg/dL (ref 8–23)
CO2: 26 mmol/L (ref 22–32)
Calcium: 8.5 mg/dL — ABNORMAL LOW (ref 8.9–10.3)
Chloride: 101 mmol/L (ref 98–111)
Creatinine: 1.16 mg/dL (ref 0.61–1.24)
GFR, Est AFR Am: >60 mL/min (ref >60)
GFR, Estimated: >60 mL/min (ref >60)
Glucose, Bld: 93 mg/dL (ref 70–99)
Potassium: 3.4 mmol/L — ABNORMAL LOW (ref 3.5–5.1)
Sodium: 139 mmol/L (ref 135–145)
Total Bilirubin: 0.2 mg/dL — ABNORMAL LOW (ref 0.3–1.2)
Total Protein: 9.2 g/dL — ABNORMAL HIGH (ref 6.5–8.1)

## 2018-03-29 LAB — CBC WITH DIFFERENTIAL (CANCER CENTER ONLY)
Abs Immature Granulocytes: 0.06 10*3/uL (ref 0.00–0.07)
Basophils Absolute: 0 10*3/uL (ref 0.0–0.1)
Basophils Relative: 0 %
Eosinophils Absolute: 0.1 10*3/uL (ref 0.0–0.5)
Eosinophils Relative: 2 %
HCT: 34.3 % — ABNORMAL LOW (ref 39.0–52.0)
Hemoglobin: 11.3 g/dL — ABNORMAL LOW (ref 13.0–17.0)
Immature Granulocytes: 1 %
Lymphocytes Relative: 59 %
Lymphs Abs: 3.4 10*3/uL (ref 0.7–4.0)
MCH: 33.1 pg (ref 26.0–34.0)
MCHC: 32.9 g/dL (ref 30.0–36.0)
MCV: 100.6 fL — ABNORMAL HIGH (ref 80.0–100.0)
Monocytes Absolute: 0.4 10*3/uL (ref 0.1–1.0)
Monocytes Relative: 6 %
Neutro Abs: 1.8 10*3/uL (ref 1.7–7.7)
Neutrophils Relative %: 32 %
Platelet Count: 172 10*3/uL (ref 150–400)
RBC: 3.41 MIL/uL — ABNORMAL LOW (ref 4.22–5.81)
RDW: 14.4 % (ref 11.5–15.5)
WBC Count: 5.8 10*3/uL (ref 4.0–10.5)
nRBC: 0 % (ref 0.0–0.2)

## 2018-03-29 MED ORDER — PROCHLORPERAZINE MALEATE 10 MG PO TABS
ORAL_TABLET | ORAL | Status: AC
  Filled 2018-03-29: qty 1

## 2018-03-29 MED ORDER — PROCHLORPERAZINE MALEATE 10 MG PO TABS
10.0000 mg | ORAL_TABLET | Freq: Once | ORAL | Status: AC
  Administered 2018-03-29: 10 mg via ORAL

## 2018-03-29 MED ORDER — BORTEZOMIB CHEMO SQ INJECTION 3.5 MG (2.5MG/ML)
1.3000 mg/m2 | Freq: Once | INTRAMUSCULAR | Status: AC
  Administered 2018-03-29: 3.25 mg via SUBCUTANEOUS
  Filled 2018-03-29: qty 1.3

## 2018-03-29 NOTE — Progress Notes (Signed)
Met with patient to introduce myself as Arboriculturist and to offer available resources.  Discussed one-time $98 Engineer, drilling to assist with personal expenses while going through treatment. He states his household exceeds the income guidelines.  Also, made him aware of several foundations that have funds available for his diagnosis for copays. His income exceeds guidelines as well.  Gave my card for any additional financial questions or concerns.

## 2018-03-29 NOTE — Patient Instructions (Signed)
[facility] Discharge Instructions for Patients Receiving Chemotherapy  Today you received the following chemotherapy agents Velcade.  To help prevent nausea and vomiting after your treatment, we encourage you to take your nausea medication as directed.  If you develop nausea and vomiting that is not controlled by your nausea medication, call the clinic.   BELOW ARE SYMPTOMS THAT SHOULD BE REPORTED IMMEDIATELY:  *FEVER GREATER THAN 100.5 F  *CHILLS WITH OR WITHOUT FEVER  NAUSEA AND VOMITING THAT IS NOT CONTROLLED WITH YOUR NAUSEA MEDICATION  *UNUSUAL SHORTNESS OF BREATH  *UNUSUAL BRUISING OR BLEEDING  TENDERNESS IN MOUTH AND THROAT WITH OR WITHOUT PRESENCE OF ULCERS  *URINARY PROBLEMS  *BOWEL PROBLEMS  UNUSUAL RASH Items with * indicate a potential emergency and should be followed up as soon as possible.  Feel free to call the clinic should you have any questions or concerns. The clinic phone number is [phone].  Please show the CHEMO ALERT CARD at check-in to the Emergency Department and triage nurse.   

## 2018-03-30 ENCOUNTER — Other Ambulatory Visit: Payer: Self-pay | Admitting: Medical Oncology

## 2018-03-30 DIAGNOSIS — F419 Anxiety disorder, unspecified: Secondary | ICD-10-CM

## 2018-03-30 MED ORDER — ALPRAZOLAM 0.25 MG PO TABS: 0.2500 mg | ORAL_TABLET | Freq: Every evening | ORAL | 0 refills | Status: DC | PRN

## 2018-04-05 ENCOUNTER — Encounter: Payer: Self-pay | Admitting: Physician Assistant

## 2018-04-05 ENCOUNTER — Inpatient Hospital Stay: Payer: BLUE CROSS/BLUE SHIELD | Attending: Internal Medicine

## 2018-04-05 ENCOUNTER — Inpatient Hospital Stay: Payer: BLUE CROSS/BLUE SHIELD

## 2018-04-05 ENCOUNTER — Inpatient Hospital Stay (HOSPITAL_BASED_OUTPATIENT_CLINIC_OR_DEPARTMENT_OTHER): Payer: BLUE CROSS/BLUE SHIELD | Admitting: Physician Assistant

## 2018-04-05 ENCOUNTER — Other Ambulatory Visit: Payer: Self-pay | Admitting: Internal Medicine

## 2018-04-05 ENCOUNTER — Other Ambulatory Visit: Payer: Self-pay

## 2018-04-05 VITALS — BP 151/73 | HR 85 | Temp 98.6°F | Resp 18 | Ht 75.0 in | Wt 264.5 lb

## 2018-04-05 DIAGNOSIS — C9000 Multiple myeloma not having achieved remission: Secondary | ICD-10-CM | POA: Insufficient documentation

## 2018-04-05 DIAGNOSIS — Z5111 Encounter for antineoplastic chemotherapy: Secondary | ICD-10-CM

## 2018-04-05 DIAGNOSIS — Z5112 Encounter for antineoplastic immunotherapy: Secondary | ICD-10-CM | POA: Insufficient documentation

## 2018-04-05 LAB — CBC WITH DIFFERENTIAL (CANCER CENTER ONLY)
Abs Immature Granulocytes: 0.01 10*3/uL (ref 0.00–0.07)
Basophils Absolute: 0 10*3/uL (ref 0.0–0.1)
Basophils Relative: 0 %
Eosinophils Absolute: 0.1 10*3/uL (ref 0.0–0.5)
Eosinophils Relative: 2 %
HCT: 33.7 % — ABNORMAL LOW (ref 39.0–52.0)
Hemoglobin: 11.7 g/dL — ABNORMAL LOW (ref 13.0–17.0)
Immature Granulocytes: 0 %
Lymphocytes Relative: 46 %
Lymphs Abs: 2.1 10*3/uL (ref 0.7–4.0)
MCH: 35 pg — ABNORMAL HIGH (ref 26.0–34.0)
MCHC: 34.7 g/dL (ref 30.0–36.0)
MCV: 100.9 fL — ABNORMAL HIGH (ref 80.0–100.0)
Monocytes Absolute: 0.8 10*3/uL (ref 0.1–1.0)
Monocytes Relative: 16 %
Neutro Abs: 1.7 10*3/uL (ref 1.7–7.7)
Neutrophils Relative %: 36 %
Platelet Count: 158 10*3/uL (ref 150–400)
RBC: 3.34 MIL/uL — ABNORMAL LOW (ref 4.22–5.81)
RDW: 14.4 % (ref 11.5–15.5)
WBC Count: 4.7 10*3/uL (ref 4.0–10.5)
nRBC: 0 % (ref 0.0–0.2)

## 2018-04-05 LAB — CMP (CANCER CENTER ONLY)
ALT: 18 U/L (ref 0–44)
AST: 12 U/L — ABNORMAL LOW (ref 15–41)
Albumin: 2.9 g/dL — ABNORMAL LOW (ref 3.5–5.0)
Alkaline Phosphatase: 61 U/L (ref 38–126)
Anion gap: 9 (ref 5–15)
BUN: 12 mg/dL (ref 8–23)
CO2: 27 mmol/L (ref 22–32)
Calcium: 8.5 mg/dL — ABNORMAL LOW (ref 8.9–10.3)
Chloride: 104 mmol/L (ref 98–111)
Creatinine: 1.17 mg/dL (ref 0.61–1.24)
GFR, Est AFR Am: >60 mL/min (ref >60)
GFR, Estimated: >60 mL/min (ref >60)
Glucose, Bld: 99 mg/dL (ref 70–99)
Potassium: 3.6 mmol/L (ref 3.5–5.1)
Sodium: 140 mmol/L (ref 135–145)
Total Bilirubin: 0.3 mg/dL (ref 0.3–1.2)
Total Protein: 8.1 g/dL (ref 6.5–8.1)

## 2018-04-05 MED ORDER — BORTEZOMIB CHEMO SQ INJECTION 3.5 MG (2.5MG/ML)
1.3000 mg/m2 | Freq: Once | INTRAMUSCULAR | Status: AC
  Administered 2018-04-05: 3.25 mg via SUBCUTANEOUS
  Filled 2018-04-05: qty 1.3

## 2018-04-05 MED ORDER — PROCHLORPERAZINE MALEATE 10 MG PO TABS
10.0000 mg | ORAL_TABLET | Freq: Once | ORAL | Status: AC
  Administered 2018-04-05: 10 mg via ORAL

## 2018-04-05 MED ORDER — PROCHLORPERAZINE MALEATE 10 MG PO TABS
ORAL_TABLET | ORAL | Status: AC
  Filled 2018-04-05: qty 1

## 2018-04-05 NOTE — Patient Instructions (Signed)
[facility] Discharge Instructions for Patients Receiving Chemotherapy  Today you received the following chemotherapy agents Velcade.  To help prevent nausea and vomiting after your treatment, we encourage you to take your nausea medication as directed.  If you develop nausea and vomiting that is not controlled by your nausea medication, call the clinic.   BELOW ARE SYMPTOMS THAT SHOULD BE REPORTED IMMEDIATELY:  *FEVER GREATER THAN 100.5 F  *CHILLS WITH OR WITHOUT FEVER  NAUSEA AND VOMITING THAT IS NOT CONTROLLED WITH YOUR NAUSEA MEDICATION  *UNUSUAL SHORTNESS OF BREATH  *UNUSUAL BRUISING OR BLEEDING  TENDERNESS IN MOUTH AND THROAT WITH OR WITHOUT PRESENCE OF ULCERS  *URINARY PROBLEMS  *BOWEL PROBLEMS  UNUSUAL RASH Items with * indicate a potential emergency and should be followed up as soon as possible.  Feel free to call the clinic should you have any questions or concerns. The clinic phone number is [phone].  Please show the CHEMO ALERT CARD at check-in to the Emergency Department and triage nurse.   

## 2018-04-05 NOTE — Progress Notes (Signed)
[facility] [facility] OFFICE PROGRESS NOTE  Derinda Late, MD [phone] [address] [facility] [place] [address]  DIAGNOSIS: Multiple myeloma, IgG subtype diagnosed in January 2020.  PRIOR THERAPY: None  CURRENT THERAPY: Systemic chemotherapy with Velcade 1.3 mg/M2 on days 1, 8, 15 as well as Revlimid 25 mg p.o. daily for 21 days every 4 weeks as well as Decadron 40 mg weekly.  First dose expected March 22, 2018. Status post 1 cycle.   INTERVAL HISTORY: Matthew Yates 68 y.o. male returns to the clinic today for a follow-up visit.  The patient is feeling well today without any concerning complaints.  He was recently started on Velcade, Revlimid, and decadron.  He is tolerating treatment well without any adverse effects.  He denies any fevers, chills, lymphadenopathy, or weight loss. He reports night sweats but states this has been present for many years.  He denies any chest pain, shortness of breath, or cough.  Denies any nausea, vomiting, diarrhea, or constipation.  He was started on prophylactic Coumadin 2 mg and acyclovir at the last encounter and is tolerating those without any concerning complaints such as hematuria, melena, hemoptysis, epistaxis, or other bleeding or bruising. He denies any rashes or skin changes. He is here today for evaluation prior to starting day 15 of cycle 1.  MEDICAL HISTORY: Past Medical History:  Diagnosis Date  . Acute medial meniscus tear of left knee   . Arthritis    lt knee  . GERD (gastroesophageal reflux disease)   . Hypertension     ALLERGIES:  is allergic to other.  MEDICATIONS:  Current Outpatient Medications  Medication Sig Dispense Refill  . acyclovir (ZOVIRAX) 200 MG capsule Take 1 capsule (200 mg total) by mouth 2 (two) times daily. 60 capsule 2  . ALPRAZolam (XANAX) 0.25 MG tablet Take 1 tablet (0.25 mg total) by mouth at bedtime as needed for anxiety. 30 tablet 0  . amLODipine (NORVASC) 5  MG tablet Take by mouth.    . dexamethasone (DECADRON) 4 MG tablet 10 tab po once weekly with chemo 80 tablet 2  . esomeprazole (NEXIUM) 20 MG capsule Take 1 capsule (20 mg total) by mouth daily before breakfast. 30 capsule 1  . lenalidomide (REVLIMID) 25 MG capsule Take 1 capsule (25 mg total) by mouth daily. Take for 21 days on, 7 days off, repeat every 28 days. Adult male auth # 03/16/2018 [id number] 21 capsule 0  . loratadine-pseudoephedrine (CLARITIN-D 12-HOUR) 5-120 MG tablet Take 1 tablet by mouth 2 (two) times daily.    Marland Kitchen senna-docusate (SENOKOT S) 8.6-50 MG tablet Take 1 tablet by mouth at bedtime as needed. 30 tablet 1  . warfarin (COUMADIN) 2 MG tablet Take 1 tablet (2 mg total) by mouth daily. 90 tablet 1  . HYDROcodone-acetaminophen (NORCO) 7.5-325 MG tablet Take 1-2 tablets by mouth every 6 (six) hours as needed for moderate pain. (Patient not taking: Reported on 04/05/2018) 90 tablet 0  . ibuprofen (ADVIL,MOTRIN) 400 MG tablet Take 400 mg by mouth every 6 (six) hours as needed.    Marland Kitchen LORazepam (ATIVAN) 0.5 MG tablet Take by mouth.    . ondansetron (ZOFRAN) 4 MG tablet Take 1-2 tablets (4-8 mg total) by mouth every 8 (eight) hours as needed for nausea or vomiting. (Patient not taking: Reported on 04/05/2018) 40 tablet 0  . prochlorperazine (COMPAZINE) 10 MG tablet Take 1 tablet (10 mg total) by mouth every 6 (six) hours as needed  for nausea or vomiting. (Patient not taking: Reported on 04/05/2018) 30 tablet 0   No current facility-administered medications for this visit.    Facility-Administered Medications Ordered in Other Visits  Medication Dose Route Frequency Provider Last Rate Last Dose  . bortezomib SQ (VELCADE) chemo injection 3.25 mg  1.3 mg/m2 (Treatment Plan Recorded) Subcutaneous Once Curt Bears, MD        SURGICAL HISTORY:  Past Surgical History:  Procedure Laterality Date  . CHONDROPLASTY Left 07/09/2016   Procedure: CHONDROPLASTY with MICROFRACTURE;  Surgeon: Leandrew Koyanagi, MD;  Location: [facility];  Service: Orthopedics;  Laterality: Left;  . COLONOSCOPY     x2  . HIP SURGERY     aspiration only, no anesthesia  . KNEE ARTHROSCOPY WITH MEDIAL MENISECTOMY Left 07/09/2016   Procedure: LEFT KNEE ARTHROSCOPY WITH PARTIAL MEDIAL MENISCECTOMY;  Surgeon: Leandrew Koyanagi, MD;  Location: [facility];  Service: Orthopedics;  Laterality: Left;    REVIEW OF SYSTEMS:   Review of Systems  Constitutional: Negative for appetite change, chills, fatigue, fever and unexpected weight change.  HENT:   Negative for mouth sores, nosebleeds, sore throat and trouble swallowing.   Eyes: Negative for eye problems and icterus.  Respiratory: Negative for cough, hemoptysis, shortness of breath and wheezing.   Cardiovascular: Negative for chest pain and leg swelling.  Gastrointestinal: Negative for abdominal pain, constipation, diarrhea, nausea and vomiting.  Genitourinary: Negative for bladder incontinence, difficulty urinating, dysuria, frequency and hematuria.   Musculoskeletal: Negative for back pain, gait problem, neck pain and neck stiffness.  Skin: Negative for itching and rash.  Neurological: Negative for dizziness, extremity weakness, gait problem, headaches, light-headedness and seizures.  Hematological: Negative for adenopathy. Does not bruise/bleed easily.  Psychiatric/Behavioral: Negative for confusion, depression and sleep disturbance. The patient is not nervous/anxious.     PHYSICAL EXAMINATION:  Blood pressure (!) 151/73, pulse 85, temperature 98.6 F (37 C), temperature source Oral, resp. rate 18, height 6' 3"  (1.905 m), weight 264 lb 8 oz (120 kg), SpO2 100 %.  ECOG PERFORMANCE STATUS: 1 - Symptomatic but completely ambulatory  Physical Exam  Constitutional: Oriented to person, place, and time and well-developed, well-nourished, and in no distress. No distress.  HENT:  Head: Normocephalic and atraumatic.  Mouth/Throat: Oropharynx is  clear and moist. No oropharyngeal exudate.  Eyes: Conjunctivae are normal. Right eye exhibits no discharge. Left eye exhibits no discharge. No scleral icterus.  Neck: Normal range of motion. Neck supple.  Cardiovascular: Normal rate, regular rhythm, normal heart sounds and intact distal pulses.   Pulmonary/Chest: Effort normal and breath sounds normal. No respiratory distress. No wheezes. No rales.  Abdominal: Soft. Bowel sounds are normal. Exhibits no distension and no mass. There is no tenderness.  Musculoskeletal: Normal range of motion. Exhibits no edema.  Lymphadenopathy:    No cervical adenopathy.  Neurological: Alert and oriented to person, place, and time. Exhibits normal muscle tone. Gait normal. Coordination normal.  Skin: Skin is warm and dry. No rash noted. Not diaphoretic. No erythema. No pallor.  Psychiatric: Mood, memory and judgment normal.  Vitals reviewed.  LABORATORY DATA: Lab Results  Component Value Date   WBC 4.7 04/05/2018   HGB 11.7 (L) 04/05/2018   HCT 33.7 (L) 04/05/2018   MCV 100.9 (H) 04/05/2018   PLT 158 04/05/2018      Chemistry      Component Value Date/Time   NA 140 04/05/2018 0919   K 3.6 04/05/2018 0919   CL 104  04/05/2018 0919   CO2 27 04/05/2018 0919   BUN 12 04/05/2018 0919   CREATININE 1.17 04/05/2018 0919      Component Value Date/Time   CALCIUM 8.5 (L) 04/05/2018 0919   ALKPHOS 61 04/05/2018 0919   AST 12 (L) 04/05/2018 0919   ALT 18 04/05/2018 0919   BILITOT 0.3 04/05/2018 0919       RADIOGRAPHIC STUDIES:  No results found.   ASSESSMENT/PLAN:  This is a very pleasant 68 year old African-American male who was recently diagnosed with multiple myeloma, IgG subtype.  He was diagnosed in January 2020. He is currently undergoing treatment with Velcade 1.3 mg/m2 on days 1, 8, and 15 every 4 weeks in addition to Revlimid 25 mg p.o. daily for 21 days every 4 weeks. He also takes 40 mg of Decadron p.o weekly. After the initial 4-6  cycles of this treatment, the patient will be referred to a tertiary center for evaluation and discussion of an autologous stem cell transplant. The patient was seen with Dr. Julien Nordmann today.  The patient is scheduled to get day 15 of cycle 1 today.  He tolerated the first treatment well without any adverse effects.  Labs were reviewed with the patient. His total protein is improved and has decreased to be within normal limits. Recommend he proceed with day 15 of cycle 1 today as scheduled.  He will continue on prophylactic Coumadin 2 mg.  He is tolerating Coumadin well without any adverse side effects such as bleeding or bruising. He will also continue on prophylactic acyclovir 200 mg p.o. twice daily. We will see the patient back in approximately 3 weeks for evaluation.  The patient's blood pressure was noted to be elevated today. He states this is secondary to being anxious. He states he takes his blood pressure at home and it is within normal limits at home. Advised him to continue taking his Norvasc as prescribed and to monitor blood pressure at home.  The patient was advised to call immediately if he has any concerning symptoms in the interval. The patient voices understanding of current disease status and treatment options and is in agreement with the current care plan. All questions were answered. The patient knows to call the clinic with any problems, questions or concerns. We can certainly see the patient much sooner if necessary  No orders of the defined types were placed in this encounter.    Ebone Alcivar L Zelina Jimerson, PA-C 04/05/18  ADDENDUM: Hematology/Oncology Attending: I had a face-to-face encounter with the patient today.  I recommended his care plan.  This is a very pleasant 68 years old African-American male recently diagnosed with multiple myeloma IgG subtype.  The patient is currently undergoing systemic chemotherapy with weekly Velcade 1.3 mg/M2 in addition to Revlimid 25 mg  p.o. daily for 21 days and weekly Decadron every 4 weeks.  He status post 2 weeks of treatment and has been tolerating this treatment well with no concerning adverse effects. I recommended for the patient to continue his current treatment with the same dose and regimen. We will see him back for follow-up visit in 3 weeks for reevaluation and management of any adverse effect of his treatment. The patient was advised to call immediately if he has any concerning symptoms in the interval.  Disclaimer: This note was dictated with voice recognition software. Similar sounding words can inadvertently be transcribed and may be missed upon review. Eilleen Kempf, MD 04/05/18

## 2018-04-06 ENCOUNTER — Telehealth: Payer: Self-pay | Admitting: Medical Oncology

## 2018-04-06 NOTE — Telephone Encounter (Addendum)
Pt called about appts ibuprofen returned call and unable to reach pt at mutiple phone numbers, His 3/9 lab and infusion is cancelled per [facility].  .\/ab and chemo weekly x 3 then off 1 week off.

## 2018-04-08 ENCOUNTER — Telehealth: Payer: Self-pay | Admitting: Medical Oncology

## 2018-04-08 NOTE — Telephone Encounter (Signed)
appts cancelled for next week-pt notified.

## 2018-04-12 ENCOUNTER — Other Ambulatory Visit: Payer: BLUE CROSS/BLUE SHIELD

## 2018-04-12 ENCOUNTER — Other Ambulatory Visit: Payer: Self-pay | Admitting: Internal Medicine

## 2018-04-12 ENCOUNTER — Ambulatory Visit: Payer: BLUE CROSS/BLUE SHIELD

## 2018-04-12 ENCOUNTER — Ambulatory Visit: Payer: BLUE CROSS/BLUE SHIELD | Admitting: Physician Assistant

## 2018-04-12 DIAGNOSIS — C9000 Multiple myeloma not having achieved remission: Secondary | ICD-10-CM

## 2018-04-19 ENCOUNTER — Other Ambulatory Visit: Payer: Self-pay

## 2018-04-19 ENCOUNTER — Inpatient Hospital Stay: Payer: BLUE CROSS/BLUE SHIELD

## 2018-04-19 ENCOUNTER — Telehealth: Payer: Self-pay | Admitting: Internal Medicine

## 2018-04-19 ENCOUNTER — Other Ambulatory Visit: Payer: BLUE CROSS/BLUE SHIELD

## 2018-04-19 ENCOUNTER — Inpatient Hospital Stay: Payer: BLUE CROSS/BLUE SHIELD | Admitting: Physician Assistant

## 2018-04-19 ENCOUNTER — Encounter: Payer: Self-pay | Admitting: Physician Assistant

## 2018-04-19 VITALS — BP 140/81 | HR 86 | Temp 98.5°F | Resp 20 | Ht 75.0 in | Wt 263.3 lb

## 2018-04-19 DIAGNOSIS — C9000 Multiple myeloma not having achieved remission: Secondary | ICD-10-CM

## 2018-04-19 DIAGNOSIS — Z79899 Other long term (current) drug therapy: Secondary | ICD-10-CM | POA: Diagnosis not present

## 2018-04-19 DIAGNOSIS — Z5111 Encounter for antineoplastic chemotherapy: Secondary | ICD-10-CM

## 2018-04-19 LAB — CBC WITH DIFFERENTIAL (CANCER CENTER ONLY)
Abs Immature Granulocytes: 0.01 10*3/uL (ref 0.00–0.07)
Basophils Absolute: 0 10*3/uL (ref 0.0–0.1)
Basophils Relative: 1 %
Eosinophils Absolute: 0.1 10*3/uL (ref 0.0–0.5)
Eosinophils Relative: 1 %
HCT: 36.6 % — ABNORMAL LOW (ref 39.0–52.0)
Hemoglobin: 12.3 g/dL — ABNORMAL LOW (ref 13.0–17.0)
Immature Granulocytes: 0 %
Lymphocytes Relative: 45 %
Lymphs Abs: 2.5 10*3/uL (ref 0.7–4.0)
MCH: 33.8 pg (ref 26.0–34.0)
MCHC: 33.6 g/dL (ref 30.0–36.0)
MCV: 100.5 fL — ABNORMAL HIGH (ref 80.0–100.0)
Monocytes Absolute: 0.6 10*3/uL (ref 0.1–1.0)
Monocytes Relative: 11 %
Neutro Abs: 2.4 10*3/uL (ref 1.7–7.7)
Neutrophils Relative %: 42 %
Platelet Count: 265 10*3/uL (ref 150–400)
RBC: 3.64 MIL/uL — ABNORMAL LOW (ref 4.22–5.81)
RDW: 14.5 % (ref 11.5–15.5)
WBC Count: 5.5 10*3/uL (ref 4.0–10.5)
nRBC: 0 % (ref 0.0–0.2)

## 2018-04-19 LAB — CMP (CANCER CENTER ONLY)
ALT: 14 U/L (ref 0–44)
AST: 12 U/L — ABNORMAL LOW (ref 15–41)
Albumin: 3.4 g/dL — ABNORMAL LOW (ref 3.5–5.0)
Alkaline Phosphatase: 87 U/L (ref 38–126)
Anion gap: 10 (ref 5–15)
BUN: 13 mg/dL (ref 8–23)
CO2: 26 mmol/L (ref 22–32)
Calcium: 9 mg/dL (ref 8.9–10.3)
Chloride: 105 mmol/L (ref 98–111)
Creatinine: 1.22 mg/dL (ref 0.61–1.24)
GFR, Est AFR Am: >60 mL/min (ref >60)
GFR, Estimated: >60 mL/min (ref >60)
Glucose, Bld: 95 mg/dL (ref 70–99)
Potassium: 3.9 mmol/L (ref 3.5–5.1)
Sodium: 141 mmol/L (ref 135–145)
Total Bilirubin: 0.3 mg/dL (ref 0.3–1.2)
Total Protein: 8.1 g/dL (ref 6.5–8.1)

## 2018-04-19 MED ORDER — PROCHLORPERAZINE MALEATE 10 MG PO TABS
ORAL_TABLET | ORAL | Status: AC
  Filled 2018-04-19: qty 1

## 2018-04-19 MED ORDER — LENALIDOMIDE 25 MG PO CAPS: ORAL_CAPSULE | ORAL | 3 refills | Status: DC

## 2018-04-19 MED ORDER — BORTEZOMIB CHEMO SQ INJECTION 3.5 MG (2.5MG/ML)
1.3000 mg/m2 | Freq: Once | INTRAMUSCULAR | Status: AC
  Administered 2018-04-19: 3.25 mg via SUBCUTANEOUS
  Filled 2018-04-19: qty 1.3

## 2018-04-19 MED ORDER — PROCHLORPERAZINE MALEATE 10 MG PO TABS
10.0000 mg | ORAL_TABLET | Freq: Once | ORAL | Status: AC
  Administered 2018-04-19: 10 mg via ORAL

## 2018-04-19 NOTE — Patient Instructions (Signed)
[facility] Discharge Instructions for Patients Receiving Chemotherapy  Today you received the following chemotherapy agents Velcade.  To help prevent nausea and vomiting after your treatment, we encourage you to take your nausea medication as directed.  If you develop nausea and vomiting that is not controlled by your nausea medication, call the clinic.   BELOW ARE SYMPTOMS THAT SHOULD BE REPORTED IMMEDIATELY:  *FEVER GREATER THAN 100.5 F  *CHILLS WITH OR WITHOUT FEVER  NAUSEA AND VOMITING THAT IS NOT CONTROLLED WITH YOUR NAUSEA MEDICATION  *UNUSUAL SHORTNESS OF BREATH  *UNUSUAL BRUISING OR BLEEDING  TENDERNESS IN MOUTH AND THROAT WITH OR WITHOUT PRESENCE OF ULCERS  *URINARY PROBLEMS  *BOWEL PROBLEMS  UNUSUAL RASH Items with * indicate a potential emergency and should be followed up as soon as possible.  Feel free to call the clinic should you have any questions or concerns. The clinic phone number is [phone].  Please show the CHEMO ALERT CARD at check-in to the Emergency Department and triage nurse.   

## 2018-04-19 NOTE — Telephone Encounter (Signed)
Scheduled appt per 03/16.  Patient received avs and calender.

## 2018-04-19 NOTE — Progress Notes (Signed)
[facility] OFFICE PROGRESS NOTE  Derinda Late, MD [phone] [facility] [place] [address]  DIAGNOSIS: Multiple myeloma, IgG subtype diagnosed in January 2020.   PRIOR THERAPY: None  CURRENT THERAPY: Systemic chemotherapy with Velcade 1.3 mg/M2 on days 1, 8, 15 as well as Revlimid 25 mg p.o. daily for 21 days every 4 weeks as well as Decadron 40 mg weekly. First dose expected March 22, 2018. Status post 1 cycle.   INTERVAL HISTORY: [name] 68 y.o. male returns to the clinic today for a follow-up visit. The patient is feeling well today without any concerning complaints.  He recently completed his first cycle of treatment with Velcade, Revlimid, and Decadron.  He is tolerating treatment well without any adverse effects.  He denies any fever, chills, night sweats, weight loss, or lymphadenopathy.  He reports occasional night sweats which is normal for him and has been present for many years.  He denies any chest pain, shortness of breath, cough, or hemoptysis.  He denies any nausea, vomiting, diarrhea, or constipation.  He continues to take his prophylactic acyclovir and Coumadin without any side effects such as hematuria, melena, hemoptysis, and epistaxis. He denies any rashes or skin changes. He denies any recent infections. He is here today for evaluation prior to staring cycle 2 of treatment today.   MEDICAL HISTORY: Past Medical History:  Diagnosis Date  . Acute medial meniscus tear of left knee   . Arthritis    lt knee  . GERD (gastroesophageal reflux disease)   . Hypertension     ALLERGIES:  is allergic to other.  MEDICATIONS:  Current Outpatient Medications  Medication Sig Dispense Refill  . acyclovir (ZOVIRAX) 200 MG capsule Take 1 capsule (200 mg total) by mouth 2 (two) times daily. 60 capsule 2  . ALPRAZolam (XANAX) 0.25 MG tablet Take 1 tablet (0.25 mg total) by mouth at bedtime as needed for  anxiety. 30 tablet 0  . amLODipine (NORVASC) 5 MG tablet Take by mouth.    . dexamethasone (DECADRON) 4 MG tablet 10 tab po once weekly with chemo 80 tablet 2  . esomeprazole (NEXIUM) 20 MG capsule Take 1 capsule (20 mg total) by mouth daily before breakfast. 30 capsule 1  . HYDROcodone-acetaminophen (NORCO) 7.5-325 MG tablet Take 1-2 tablets by mouth every 6 (six) hours as needed for moderate pain. (Patient not taking: Reported on 04/05/2018) 90 tablet 0  . ibuprofen (ADVIL,MOTRIN) 400 MG tablet Take 400 mg by mouth every 6 (six) hours as needed.    Marland Kitchen lenalidomide (REVLIMID) 25 MG capsule TAKE 1 CAPSULE (25 MG TOTAL) BY MOUTH DAILY., TAKE FOR 21 DAYS ON, THEN 7 DAYS OFF, REPEAT EVERY 28 DAYS 21 capsule 3  . loratadine-pseudoephedrine (CLARITIN-D 12-HOUR) 5-120 MG tablet Take 1 tablet by mouth 2 (two) times daily.    Marland Kitchen LORazepam (ATIVAN) 0.5 MG tablet Take by mouth.    . ondansetron (ZOFRAN) 4 MG tablet Take 1-2 tablets (4-8 mg total) by mouth every 8 (eight) hours as needed for nausea or vomiting. (Patient not taking: Reported on 04/05/2018) 40 tablet 0  . prochlorperazine (COMPAZINE) 10 MG tablet Take 1 tablet (10 mg total) by mouth every 6 (six) hours as needed for nausea or vomiting. (Patient not taking: Reported on 04/05/2018) 30 tablet 0  . senna-docusate (SENOKOT S) 8.6-50 MG tablet Take 1 tablet by mouth at bedtime as needed. 30 tablet 1  . warfarin (COUMADIN) 2 MG  tablet Take 1 tablet (2 mg total) by mouth daily. 90 tablet 1   No current facility-administered medications for this visit.    Facility-Administered Medications Ordered in Other Visits  Medication Dose Route Frequency Provider Last Rate Last Dose  . bortezomib SQ (VELCADE) chemo injection 3.25 mg  1.3 mg/m2 (Treatment Plan Recorded) Subcutaneous Once Curt Bears, MD        SURGICAL HISTORY:  Past Surgical History:  Procedure Laterality Date  . CHONDROPLASTY Left 07/09/2016   Procedure: CHONDROPLASTY with MICROFRACTURE;   Surgeon: Leandrew Koyanagi, MD;  Location: [facility];  Service: Orthopedics;  Laterality: Left;  . COLONOSCOPY     x2  . HIP SURGERY     aspiration only, no anesthesia  . KNEE ARTHROSCOPY WITH MEDIAL MENISECTOMY Left 07/09/2016   Procedure: LEFT KNEE ARTHROSCOPY WITH PARTIAL MEDIAL MENISCECTOMY;  Surgeon: Leandrew Koyanagi, MD;  Location: [facility];  Service: Orthopedics;  Laterality: Left;    REVIEW OF SYSTEMS:   Review of Systems  Constitutional: Negative for appetite change, chills, fatigue, fever and unexpected weight change.  HENT:   Negative for mouth sores, nosebleeds, sore throat and trouble swallowing.   Eyes: Negative for eye problems and icterus.  Respiratory: Negative for cough, hemoptysis, shortness of breath and wheezing.   Cardiovascular: Negative for chest pain and leg swelling.  Gastrointestinal: Negative for abdominal pain, constipation, diarrhea, nausea and vomiting.  Genitourinary: Negative for bladder incontinence, difficulty urinating, dysuria, frequency and hematuria.   Musculoskeletal: Negative for back pain, gait problem, neck pain and neck stiffness.  Skin: Negative for itching and rash.  Neurological: Negative for dizziness, extremity weakness, gait problem, headaches, light-headedness and seizures.  Hematological: Negative for adenopathy. Does not bruise/bleed easily.  Psychiatric/Behavioral: Negative for confusion, depression and sleep disturbance. The patient is not nervous/anxious.     PHYSICAL EXAMINATION:  Blood pressure 140/81, pulse 86, temperature 98.5 F (36.9 C), temperature source Oral, resp. rate 20, height 6' 3" (1.905 m), weight 263 lb 4.8 oz (119.4 kg), SpO2 100 %.  ECOG PERFORMANCE STATUS: 1 - Symptomatic but completely ambulatory  Physical Exam  Constitutional: Oriented to person, place, and time and well-developed, well-nourished, and in no distress. No distress.  HENT:  Head: Normocephalic and atraumatic.   Mouth/Throat: Oropharynx is clear and moist. No oropharyngeal exudate.  Eyes: Conjunctivae are normal. Right eye exhibits no discharge. Left eye exhibits no discharge. No scleral icterus.  Neck: Normal range of motion. Neck supple.  Cardiovascular: Normal rate, regular rhythm, normal heart sounds and intact distal pulses.   Pulmonary/Chest: Effort normal and breath sounds normal. No respiratory distress. No wheezes. No rales.  Abdominal: Soft. Bowel sounds are normal. Exhibits no distension and no mass. There is no tenderness.  Musculoskeletal: Normal range of motion. Exhibits no edema.  Lymphadenopathy:    No cervical adenopathy.  Neurological: Alert and oriented to person, place, and time. Exhibits normal muscle tone. Gait normal. Coordination normal.  Skin: Skin is warm and dry. No rash noted. Not diaphoretic. No erythema. No pallor.  Psychiatric: Mood, memory and judgment normal.  Vitals reviewed.  LABORATORY DATA: Lab Results  Component Value Date   WBC 5.5 04/19/2018   HGB 12.3 (L) 04/19/2018   HCT 36.6 (L) 04/19/2018   MCV 100.5 (H) 04/19/2018   PLT 265 04/19/2018      Chemistry      Component Value Date/Time   NA 141 04/19/2018 1307   K 3.9 04/19/2018 1307   CL 105 04/19/2018  1307   CO2 26 04/19/2018 1307   BUN 13 04/19/2018 1307   CREATININE 1.22 04/19/2018 1307      Component Value Date/Time   CALCIUM 9.0 04/19/2018 1307   ALKPHOS 87 04/19/2018 1307   AST 12 (L) 04/19/2018 1307   ALT 14 04/19/2018 1307   BILITOT 0.3 04/19/2018 1307       RADIOGRAPHIC STUDIES:  No results found.   ASSESSMENT/PLAN:  This is a very pleasant 68 year old African-American male who was recently diagnosed with multiple myeloma, IgG subtype.  He was diagnosed in January 2020. He is currently undergoing treatment with Velcade 1.3 mg/m2 on days 1, 8, and 15 every 4 weeks in addition to Revlimid 25 mg p.o. daily for 21 days every 4 weeks. He also takes 40 mg of Decadron p.o weekly.  He is status post 1 cycle.  After the initial 4-6 cycles of this treatment, the patient will be referred to a tertiary center for evaluation and discussion of an autologous stem cell transplant.  The patient was seen with Dr. Julien Nordmann today. The patient tolerated his first treatment well without any adverse effects. Labs were reviewed with the patient. I recommend he proceed with cycle #2 today as scheduled.   I will see him back in 4 weeks prior to starting cycle #3.    The patient will obtain a repeat myeloma panel (CMP, CBC, LDH, QIG, Beta 2 microglobulin, and kappa/lambda light chains) one week prior to his next office visit. We will review the results with him at the time of his visit.   A refill of Revlimid was sent to the patient's pharmacy.  Patient advised to continue taking his prophylactic coumadin 2 mg and acyclovir 200 mg po. BID.   The patient was advised to call immediately if he has any concerning symptoms in the interval. The patient voices understanding of current disease status and treatment options and is in agreement with the current care plan. All questions were answered. The patient knows to call the clinic with any problems, questions or concerns. We can certainly see the patient much sooner if necessary   Orders Placed This Encounter  Procedures  . Lactate dehydrogenase (LDH)    Standing Status:   Future    Standing Expiration Date:   04/19/2019  . Beta 2 microglobulin    Standing Status:   Future    Standing Expiration Date:   04/19/2019  . Kappa/lambda light chains    Standing Status:   Future    Standing Expiration Date:   04/19/2019  . QIG  (Quant. immunoglobulins  - IgG, IgA, IgM)    Standing Status:   Future    Standing Expiration Date:   04/19/2019     Tobe Sos Heilingoetter, PA-C 04/19/18  ADDENDUM: Hematology/Oncology Attending: I had a face-to-face encounter with the patient today.  I recommended his care plan.  This is a very pleasant 68 years  old African-American male recently diagnosed with multiple myeloma and currently on treatment with weekly Velcade on days 1, 8, 15 as well as Revlimid 25 mg p.o. daily for 21 days every 4 weeks and Decadron 40 mg weekly status post 1 cycle of the treatment.  The patient has been tolerating this treatment well with no concerning adverse effects. I recommended for him to continue his current treatment with the same regimen. We will see him back for follow-up visit in 4 weeks for evaluation with repeat myeloma panel 1 week before his visit. The patient was advised  to call immediately if he has any concerning symptoms in the interval. Disclaimer: This note was dictated with voice recognition software. Similar sounding words can inadvertently be transcribed and may be missed upon review. Eilleen Kempf, MD 04/19/18

## 2018-04-26 ENCOUNTER — Other Ambulatory Visit: Payer: Self-pay

## 2018-04-26 ENCOUNTER — Inpatient Hospital Stay: Payer: BLUE CROSS/BLUE SHIELD

## 2018-04-26 VITALS — BP 142/71 | HR 84 | Temp 98.9°F | Resp 18 | Wt 262.5 lb

## 2018-04-26 DIAGNOSIS — C9000 Multiple myeloma not having achieved remission: Secondary | ICD-10-CM

## 2018-04-26 LAB — CBC WITH DIFFERENTIAL (CANCER CENTER ONLY)
Abs Immature Granulocytes: 0.02 10*3/uL (ref 0.00–0.07)
Basophils Absolute: 0 10*3/uL (ref 0.0–0.1)
Basophils Relative: 1 %
Eosinophils Absolute: 0.2 10*3/uL (ref 0.0–0.5)
Eosinophils Relative: 3 %
HCT: 37.5 % — ABNORMAL LOW (ref 39.0–52.0)
Hemoglobin: 12.4 g/dL — ABNORMAL LOW (ref 13.0–17.0)
Immature Granulocytes: 0 %
Lymphocytes Relative: 49 %
Lymphs Abs: 2.9 10*3/uL (ref 0.7–4.0)
MCH: 33.7 pg (ref 26.0–34.0)
MCHC: 33.1 g/dL (ref 30.0–36.0)
MCV: 101.9 fL — ABNORMAL HIGH (ref 80.0–100.0)
Monocytes Absolute: 0.7 10*3/uL (ref 0.1–1.0)
Monocytes Relative: 12 %
Neutro Abs: 2 10*3/uL (ref 1.7–7.7)
Neutrophils Relative %: 35 %
Platelet Count: 206 10*3/uL (ref 150–400)
RBC: 3.68 MIL/uL — ABNORMAL LOW (ref 4.22–5.81)
RDW: 14.6 % (ref 11.5–15.5)
WBC Count: 5.9 10*3/uL (ref 4.0–10.5)
nRBC: 0 % (ref 0.0–0.2)

## 2018-04-26 LAB — CMP (CANCER CENTER ONLY)
ALT: 14 U/L (ref 0–44)
AST: 12 U/L — ABNORMAL LOW (ref 15–41)
Albumin: 3.3 g/dL — ABNORMAL LOW (ref 3.5–5.0)
Alkaline Phosphatase: 83 U/L (ref 38–126)
Anion gap: 11 (ref 5–15)
BUN: 9 mg/dL (ref 8–23)
CO2: 27 mmol/L (ref 22–32)
Calcium: 8.9 mg/dL (ref 8.9–10.3)
Chloride: 104 mmol/L (ref 98–111)
Creatinine: 1.11 mg/dL (ref 0.61–1.24)
GFR, Est AFR Am: >60 mL/min (ref >60)
GFR, Estimated: >60 mL/min (ref >60)
Glucose, Bld: 93 mg/dL (ref 70–99)
Potassium: 3.7 mmol/L (ref 3.5–5.1)
Sodium: 142 mmol/L (ref 135–145)
Total Bilirubin: 0.3 mg/dL (ref 0.3–1.2)
Total Protein: 7.5 g/dL (ref 6.5–8.1)

## 2018-04-26 MED ORDER — BORTEZOMIB CHEMO SQ INJECTION 3.5 MG (2.5MG/ML)
1.3000 mg/m2 | Freq: Once | INTRAMUSCULAR | Status: AC
  Administered 2018-04-26: 3.25 mg via SUBCUTANEOUS
  Filled 2018-04-26: qty 1.3

## 2018-04-26 MED ORDER — PROCHLORPERAZINE MALEATE 10 MG PO TABS
10.0000 mg | ORAL_TABLET | Freq: Once | ORAL | Status: AC
  Administered 2018-04-26: 10 mg via ORAL

## 2018-04-26 MED ORDER — PROCHLORPERAZINE MALEATE 10 MG PO TABS
ORAL_TABLET | ORAL | Status: AC
  Filled 2018-04-26: qty 1

## 2018-04-26 NOTE — Patient Instructions (Signed)
Coronavirus (COVID-19) Are you at risk?  Are you at risk for the Coronavirus (COVID-19)?  To be considered HIGH RISK for Coronavirus (COVID-19), you have to meet the following criteria:  . Traveled to [place], [place], [place], [place] or [place]; or in the [place] to [place], [place], [place], or [place]; and have fever, cough, and shortness of breath within the last 2 weeks of travel OR . Been in close contact with a person diagnosed with COVID-19 within the last 2 weeks and have fever, cough, and shortness of breath . IF YOU DO NOT MEET THESE CRITERIA, YOU ARE CONSIDERED LOW RISK FOR COVID-19.  What to do if you are HIGH RISK for COVID-19?  . If you are having a medical emergency, call 911. . Seek medical care right away. Before you go to a doctor's office, urgent care or emergency department, call ahead and tell them about your recent travel, contact with someone diagnosed with COVID-19, and your symptoms. You should receive instructions from your physician's office regarding next steps of care.  . When you arrive at healthcare provider, tell the healthcare staff immediately you have returned from visiting [place], [place], [place], [place] or [place]; or traveled in the [place] to [place], [place], [place], or [place]; in the last two weeks or you have been in close contact with a person diagnosed with COVID-19 in the last 2 weeks.   . Tell the health care staff about your symptoms: fever, cough and shortness of breath. . After you have been seen by a medical provider, you will be either: o Tested for (COVID-19) and discharged home on quarantine except to seek medical care if symptoms worsen, and asked to  - Stay home and avoid contact with others until you get your results (4-5 days)  - Avoid travel on public transportation if possible (such as bus, train, or airplane) or o Sent to the Emergency Department by EMS for evaluation, COVID-19 testing, and possible  admission depending on your condition and test results.  What to do if you are LOW RISK for COVID-19?  Reduce your risk of any infection by using the same precautions used for avoiding the common cold or flu:  . Wash your hands often with soap and warm water for at least 20 seconds.  If soap and water are not readily available, use an alcohol-based hand sanitizer with at least 60% alcohol.  . If coughing or sneezing, cover your mouth and nose by coughing or sneezing into the elbow areas of your shirt or coat, into a tissue or into your sleeve (not your hands). . Avoid shaking hands with others and consider head nods or verbal greetings only. . Avoid touching your eyes, nose, or mouth with unwashed hands.  . Avoid close contact with people who are sick. . Avoid places or events with large numbers of people in one location, like concerts or sporting events. . Carefully consider travel plans you have or are making. . If you are planning any travel outside or inside the [place], visit the CDC's Travelers' Health webpage for the latest health notices. . If you have some symptoms but not all symptoms, continue to monitor at home and seek medical attention if your symptoms worsen. . If you are having a medical emergency, call 911.   ADDITIONAL HEALTHCARE OPTIONS FOR PATIENTS   Telehealth / e-Visit: [email or web address]         [facility]: [phone]  [facility]: [phone]                   [facility]: [phone]    [facility] Discharge Instructions for Patients Receiving Chemotherapy  Today you received the following chemotherapy agents Velcade  To help prevent nausea and vomiting after your treatment, we encourage you to take your nausea medication as directed   If you develop nausea and vomiting that is not controlled by your nausea medication, call the clinic.   BELOW ARE  SYMPTOMS THAT SHOULD BE REPORTED IMMEDIATELY:  *FEVER GREATER THAN 100.5 F  *CHILLS WITH OR WITHOUT FEVER  NAUSEA AND VOMITING THAT IS NOT CONTROLLED WITH YOUR NAUSEA MEDICATION  *UNUSUAL SHORTNESS OF BREATH  *UNUSUAL BRUISING OR BLEEDING  TENDERNESS IN MOUTH AND THROAT WITH OR WITHOUT PRESENCE OF ULCERS  *URINARY PROBLEMS  *BOWEL PROBLEMS  UNUSUAL RASH Items with * indicate a potential emergency and should be followed up as soon as possible.  Feel free to call the clinic should you have any questions or concerns. The clinic phone number is [phone].  Please show the CHEMO ALERT CARD at check-in to the Emergency Department and triage nurse.   

## 2018-05-03 ENCOUNTER — Ambulatory Visit: Payer: BLUE CROSS/BLUE SHIELD | Admitting: Internal Medicine

## 2018-05-03 ENCOUNTER — Inpatient Hospital Stay: Payer: BLUE CROSS/BLUE SHIELD

## 2018-05-03 ENCOUNTER — Other Ambulatory Visit: Payer: Self-pay

## 2018-05-03 VITALS — BP 148/77 | HR 87 | Temp 98.3°F | Resp 17

## 2018-05-03 DIAGNOSIS — C9000 Multiple myeloma not having achieved remission: Secondary | ICD-10-CM

## 2018-05-03 LAB — CBC WITH DIFFERENTIAL (CANCER CENTER ONLY)
Abs Immature Granulocytes: 0.01 10*3/uL (ref 0.00–0.07)
Basophils Absolute: 0 10*3/uL (ref 0.0–0.1)
Basophils Relative: 1 %
Eosinophils Absolute: 0.2 10*3/uL (ref 0.0–0.5)
Eosinophils Relative: 3 %
HCT: 39.6 % (ref 39.0–52.0)
Hemoglobin: 12.9 g/dL — ABNORMAL LOW (ref 13.0–17.0)
Immature Granulocytes: 0 %
Lymphocytes Relative: 46 %
Lymphs Abs: 2.7 10*3/uL (ref 0.7–4.0)
MCH: 33.2 pg (ref 26.0–34.0)
MCHC: 32.6 g/dL (ref 30.0–36.0)
MCV: 101.8 fL — ABNORMAL HIGH (ref 80.0–100.0)
Monocytes Absolute: 0.6 10*3/uL (ref 0.1–1.0)
Monocytes Relative: 11 %
Neutro Abs: 2.3 10*3/uL (ref 1.7–7.7)
Neutrophils Relative %: 39 %
Platelet Count: 148 10*3/uL — ABNORMAL LOW (ref 150–400)
RBC: 3.89 MIL/uL — ABNORMAL LOW (ref 4.22–5.81)
RDW: 14.3 % (ref 11.5–15.5)
WBC Count: 5.7 10*3/uL (ref 4.0–10.5)
nRBC: 0 % (ref 0.0–0.2)

## 2018-05-03 LAB — CMP (CANCER CENTER ONLY)
ALT: 21 U/L (ref 0–44)
AST: 13 U/L — ABNORMAL LOW (ref 15–41)
Albumin: 3.3 g/dL — ABNORMAL LOW (ref 3.5–5.0)
Alkaline Phosphatase: 82 U/L (ref 38–126)
Anion gap: 11 (ref 5–15)
BUN: 12 mg/dL (ref 8–23)
CO2: 25 mmol/L (ref 22–32)
Calcium: 9.1 mg/dL (ref 8.9–10.3)
Chloride: 105 mmol/L (ref 98–111)
Creatinine: 1.06 mg/dL (ref 0.61–1.24)
GFR, Est AFR Am: >60 mL/min (ref >60)
GFR, Estimated: >60 mL/min (ref >60)
Glucose, Bld: 101 mg/dL — ABNORMAL HIGH (ref 70–99)
Potassium: 3.4 mmol/L — ABNORMAL LOW (ref 3.5–5.1)
Sodium: 141 mmol/L (ref 135–145)
Total Bilirubin: 0.4 mg/dL (ref 0.3–1.2)
Total Protein: 7.4 g/dL (ref 6.5–8.1)

## 2018-05-03 MED ORDER — BORTEZOMIB CHEMO SQ INJECTION 3.5 MG (2.5MG/ML)
1.3000 mg/m2 | Freq: Once | INTRAMUSCULAR | Status: AC
  Administered 2018-05-03: 3.25 mg via SUBCUTANEOUS
  Filled 2018-05-03: qty 1.3

## 2018-05-03 MED ORDER — PROCHLORPERAZINE MALEATE 10 MG PO TABS: 10.0000 mg | ORAL_TABLET | Freq: Once | ORAL | Status: DC

## 2018-05-03 NOTE — Patient Instructions (Signed)
[facility] Discharge Instructions for Patients Receiving Chemotherapy  Today you received the following chemotherapy agents Velcade.  To help prevent nausea and vomiting after your treatment, we encourage you to take your nausea medication as directed.  If you develop nausea and vomiting that is not controlled by your nausea medication, call the clinic.   BELOW ARE SYMPTOMS THAT SHOULD BE REPORTED IMMEDIATELY:  *FEVER GREATER THAN 100.5 F  *CHILLS WITH OR WITHOUT FEVER  NAUSEA AND VOMITING THAT IS NOT CONTROLLED WITH YOUR NAUSEA MEDICATION  *UNUSUAL SHORTNESS OF BREATH  *UNUSUAL BRUISING OR BLEEDING  TENDERNESS IN MOUTH AND THROAT WITH OR WITHOUT PRESENCE OF ULCERS  *URINARY PROBLEMS  *BOWEL PROBLEMS  UNUSUAL RASH Items with * indicate a potential emergency and should be followed up as soon as possible.  Feel free to call the clinic should you have any questions or concerns. The clinic phone number is [phone].  Please show the CHEMO ALERT CARD at check-in to the Emergency Department and triage nurse.   

## 2018-05-10 ENCOUNTER — Other Ambulatory Visit: Payer: Self-pay

## 2018-05-10 ENCOUNTER — Inpatient Hospital Stay: Payer: BLUE CROSS/BLUE SHIELD | Attending: Internal Medicine

## 2018-05-10 ENCOUNTER — Ambulatory Visit: Payer: BLUE CROSS/BLUE SHIELD

## 2018-05-10 DIAGNOSIS — C9000 Multiple myeloma not having achieved remission: Secondary | ICD-10-CM | POA: Diagnosis present

## 2018-05-10 DIAGNOSIS — Z5112 Encounter for antineoplastic immunotherapy: Secondary | ICD-10-CM | POA: Diagnosis present

## 2018-05-10 LAB — CMP (CANCER CENTER ONLY)
ALT: 17 U/L (ref 0–44)
AST: 11 U/L — ABNORMAL LOW (ref 15–41)
Albumin: 3.3 g/dL — ABNORMAL LOW (ref 3.5–5.0)
Alkaline Phosphatase: 87 U/L (ref 38–126)
Anion gap: 9 (ref 5–15)
BUN: 11 mg/dL (ref 8–23)
CO2: 25 mmol/L (ref 22–32)
Calcium: 8.6 mg/dL — ABNORMAL LOW (ref 8.9–10.3)
Chloride: 107 mmol/L (ref 98–111)
Creatinine: 1.1 mg/dL (ref 0.61–1.24)
GFR, Est AFR Am: >60 mL/min (ref >60)
GFR, Estimated: >60 mL/min (ref >60)
Glucose, Bld: 103 mg/dL — ABNORMAL HIGH (ref 70–99)
Potassium: 3.8 mmol/L (ref 3.5–5.1)
Sodium: 141 mmol/L (ref 135–145)
Total Bilirubin: 0.3 mg/dL (ref 0.3–1.2)
Total Protein: 6.9 g/dL (ref 6.5–8.1)

## 2018-05-10 LAB — CBC WITH DIFFERENTIAL (CANCER CENTER ONLY)
Abs Immature Granulocytes: 0.01 10*3/uL (ref 0.00–0.07)
Basophils Absolute: 0 10*3/uL (ref 0.0–0.1)
Basophils Relative: 1 %
Eosinophils Absolute: 0.4 10*3/uL (ref 0.0–0.5)
Eosinophils Relative: 7 %
HCT: 38.4 % — ABNORMAL LOW (ref 39.0–52.0)
Hemoglobin: 12.9 g/dL — ABNORMAL LOW (ref 13.0–17.0)
Immature Granulocytes: 0 %
Lymphocytes Relative: 50 %
Lymphs Abs: 2.9 10*3/uL (ref 0.7–4.0)
MCH: 33.2 pg (ref 26.0–34.0)
MCHC: 33.6 g/dL (ref 30.0–36.0)
MCV: 99 fL (ref 80.0–100.0)
Monocytes Absolute: 0.6 10*3/uL (ref 0.1–1.0)
Monocytes Relative: 10 %
Neutro Abs: 1.9 10*3/uL (ref 1.7–7.7)
Neutrophils Relative %: 32 %
Platelet Count: 143 10*3/uL — ABNORMAL LOW (ref 150–400)
RBC: 3.88 MIL/uL — ABNORMAL LOW (ref 4.22–5.81)
RDW: 14.5 % (ref 11.5–15.5)
WBC Count: 5.8 10*3/uL (ref 4.0–10.5)
nRBC: 0 % (ref 0.0–0.2)

## 2018-05-10 LAB — LACTATE DEHYDROGENASE: LDH: 142 U/L (ref 98–192)

## 2018-05-11 ENCOUNTER — Other Ambulatory Visit: Payer: Self-pay | Admitting: *Deleted

## 2018-05-11 ENCOUNTER — Other Ambulatory Visit: Payer: Self-pay | Admitting: Physician Assistant

## 2018-05-11 DIAGNOSIS — C9000 Multiple myeloma not having achieved remission: Secondary | ICD-10-CM

## 2018-05-11 LAB — KAPPA/LAMBDA LIGHT CHAINS
Kappa free light chain: 79.4 mg/L — ABNORMAL HIGH (ref 3.3–19.4)
Kappa, lambda light chain ratio: 10.18 — ABNORMAL HIGH (ref 0.26–1.65)
Lambda free light chains: 7.8 mg/L (ref 5.7–26.3)

## 2018-05-11 LAB — BETA 2 MICROGLOBULIN, SERUM: Beta-2 Microglobulin: 2.1 mg/L (ref 0.6–2.4)

## 2018-05-11 LAB — IGG, IGA, IGM
IgA: 35 mg/dL — ABNORMAL LOW (ref 61–437)
IgG (Immunoglobin G), Serum: 1696 mg/dL — ABNORMAL HIGH (ref 603–1613)
IgM (Immunoglobulin M), Srm: 28 mg/dL (ref 20–172)

## 2018-05-11 MED ORDER — LENALIDOMIDE 25 MG PO CAPS: ORAL_CAPSULE | ORAL | 0 refills | Status: DC

## 2018-05-12 ENCOUNTER — Other Ambulatory Visit: Payer: Self-pay | Admitting: Medical Oncology

## 2018-05-12 DIAGNOSIS — C9000 Multiple myeloma not having achieved remission: Secondary | ICD-10-CM

## 2018-05-12 MED ORDER — LENALIDOMIDE 25 MG PO CAPS: ORAL_CAPSULE | ORAL | 0 refills | Status: DC

## 2018-05-12 NOTE — Progress Notes (Signed)
Matthew Yates

## 2018-05-17 ENCOUNTER — Inpatient Hospital Stay: Payer: BLUE CROSS/BLUE SHIELD

## 2018-05-17 ENCOUNTER — Inpatient Hospital Stay (HOSPITAL_BASED_OUTPATIENT_CLINIC_OR_DEPARTMENT_OTHER): Payer: BLUE CROSS/BLUE SHIELD | Admitting: Physician Assistant

## 2018-05-17 ENCOUNTER — Encounter: Payer: Self-pay | Admitting: Physician Assistant

## 2018-05-17 ENCOUNTER — Other Ambulatory Visit: Payer: Self-pay

## 2018-05-17 ENCOUNTER — Other Ambulatory Visit: Payer: BLUE CROSS/BLUE SHIELD

## 2018-05-17 VITALS — BP 167/89 | HR 111 | Temp 98.0°F | Resp 18 | Ht 75.0 in | Wt 266.5 lb

## 2018-05-17 VITALS — HR 98

## 2018-05-17 DIAGNOSIS — C9000 Multiple myeloma not having achieved remission: Secondary | ICD-10-CM

## 2018-05-17 DIAGNOSIS — F419 Anxiety disorder, unspecified: Secondary | ICD-10-CM | POA: Diagnosis not present

## 2018-05-17 DIAGNOSIS — Z5111 Encounter for antineoplastic chemotherapy: Secondary | ICD-10-CM

## 2018-05-17 DIAGNOSIS — Z79899 Other long term (current) drug therapy: Secondary | ICD-10-CM | POA: Diagnosis not present

## 2018-05-17 DIAGNOSIS — Z7901 Long term (current) use of anticoagulants: Secondary | ICD-10-CM | POA: Diagnosis not present

## 2018-05-17 LAB — CMP (CANCER CENTER ONLY)
ALT: 15 U/L (ref 0–44)
AST: 14 U/L — ABNORMAL LOW (ref 15–41)
Albumin: 3.5 g/dL (ref 3.5–5.0)
Alkaline Phosphatase: 76 U/L (ref 38–126)
Anion gap: 12 (ref 5–15)
BUN: 12 mg/dL (ref 8–23)
CO2: 25 mmol/L (ref 22–32)
Calcium: 9 mg/dL (ref 8.9–10.3)
Chloride: 106 mmol/L (ref 98–111)
Creatinine: 1.14 mg/dL (ref 0.61–1.24)
GFR, Est AFR Am: >60 mL/min (ref >60)
GFR, Estimated: >60 mL/min (ref >60)
Glucose, Bld: 113 mg/dL — ABNORMAL HIGH (ref 70–99)
Potassium: 3.7 mmol/L (ref 3.5–5.1)
Sodium: 143 mmol/L (ref 135–145)
Total Bilirubin: 0.3 mg/dL (ref 0.3–1.2)
Total Protein: 7.7 g/dL (ref 6.5–8.1)

## 2018-05-17 LAB — CBC WITH DIFFERENTIAL (CANCER CENTER ONLY)
Abs Immature Granulocytes: 0.02 10*3/uL (ref 0.00–0.07)
Basophils Absolute: 0.1 10*3/uL (ref 0.0–0.1)
Basophils Relative: 1 %
Eosinophils Absolute: 0.3 10*3/uL (ref 0.0–0.5)
Eosinophils Relative: 4 %
HCT: 40 % (ref 39.0–52.0)
Hemoglobin: 13.2 g/dL (ref 13.0–17.0)
Immature Granulocytes: 0 %
Lymphocytes Relative: 49 %
Lymphs Abs: 3.7 10*3/uL (ref 0.7–4.0)
MCH: 33.3 pg (ref 26.0–34.0)
MCHC: 33 g/dL (ref 30.0–36.0)
MCV: 101 fL — ABNORMAL HIGH (ref 80.0–100.0)
Monocytes Absolute: 0.5 10*3/uL (ref 0.1–1.0)
Monocytes Relative: 7 %
Neutro Abs: 2.9 10*3/uL (ref 1.7–7.7)
Neutrophils Relative %: 39 %
Platelet Count: 213 10*3/uL (ref 150–400)
RBC: 3.96 MIL/uL — ABNORMAL LOW (ref 4.22–5.81)
RDW: 14.4 % (ref 11.5–15.5)
WBC Count: 7.5 10*3/uL (ref 4.0–10.5)
nRBC: 0 % (ref 0.0–0.2)

## 2018-05-17 MED ORDER — PROCHLORPERAZINE MALEATE 10 MG PO TABS: 10.0000 mg | ORAL_TABLET | Freq: Once | ORAL | Status: DC

## 2018-05-17 MED ORDER — BORTEZOMIB CHEMO SQ INJECTION 3.5 MG (2.5MG/ML)
1.3000 mg/m2 | Freq: Once | INTRAMUSCULAR | Status: AC
  Administered 2018-05-17: 3.25 mg via SUBCUTANEOUS
  Filled 2018-05-17: qty 1.3

## 2018-05-17 NOTE — Progress Notes (Signed)
[facility] OFFICE PROGRESS NOTE  Derinda Late, MD [phone] [facility] [place] [id number]  DIAGNOSIS: Multiple myeloma, IgG subtype diagnosed in January 2020.   PRIOR THERAPY: None  CURRENT THERAPY: Systemic chemotherapy with Velcade 1.3 mg/M2 on days 1, 8, 15 as well as Revlimid 25 mg p.o. daily for 21 days every 4 weeks as well as Decadron 40 mg weekly. First dose expected March 22, 2018. Status post 2 cycle.  INTERVAL HISTORY: Matthew Yates 68 y.o. male returns to the clinic today for a follow-up visit.  The patient is feeling well today; however, he is very anxious about the current covid-19 pandemic.  He takes 0.25 of Xanax as needed for anxiety.  He continues to tolerate his treatment well without any adverse effects.  He denies any chest pain, shortness of breath, cough, or hemoptysis.  He denies any fever, chills, night sweats, fatigue, lymphadenopathy, or weight loss.  He denies any nausea, vomiting, diarrhea, or constipation.  He denies any recent illness or infections.  He denies any rashes or skin changes.  He continues to take his prophylactic acyclovir and Coumadin without any adverse effects.  He denies any bleeding or bruising including hematuria, melena, hematochezia, or epistaxis.  He recently had a repeat myeloma panel.  He is here for evaluation and to review his lab results.   MEDICAL HISTORY: Past Medical History:  Diagnosis Date  . Acute medial meniscus tear of left knee   . Arthritis    lt knee  . GERD (gastroesophageal reflux disease)   . Hypertension     ALLERGIES:  is allergic to other.  MEDICATIONS:  Current Outpatient Medications  Medication Sig Dispense Refill  . acyclovir (ZOVIRAX) 200 MG capsule Take 1 capsule (200 mg total) by mouth 2 (two) times daily. 60 capsule 2  . ALPRAZolam (XANAX) 0.25 MG tablet Take 1 tablet (0.25 mg total) by mouth at bedtime as needed for anxiety. 30  tablet 0  . dexamethasone (DECADRON) 4 MG tablet 10 tab po once weekly with chemo 80 tablet 2  . ibuprofen (ADVIL,MOTRIN) 400 MG tablet Take 400 mg by mouth every 6 (six) hours as needed.    Marland Kitchen lenalidomide (REVLIMID) 25 MG capsule TAKE 1 CAPSULE (25 MG TOTAL) BY MOUTH DAILY FOR 21 DAYS ON, THEN 7 DAYS OFF. REPEAT EVERY 28 DAYS 21 capsule 0  . loratadine-pseudoephedrine (CLARITIN-D 12-HOUR) 5-120 MG tablet Take 1 tablet by mouth 2 (two) times daily.    Marland Kitchen LORazepam (ATIVAN) 0.5 MG tablet Take by mouth.    . senna-docusate (SENOKOT S) 8.6-50 MG tablet Take 1 tablet by mouth at bedtime as needed. 30 tablet 1  . warfarin (COUMADIN) 2 MG tablet Take 1 tablet (2 mg total) by mouth daily. 90 tablet 1  . amLODipine (NORVASC) 5 MG tablet Take by mouth.    . esomeprazole (NEXIUM) 20 MG capsule Take 1 capsule (20 mg total) by mouth daily before breakfast. 30 capsule 1  . HYDROcodone-acetaminophen (NORCO) 7.5-325 MG tablet Take 1-2 tablets by mouth every 6 (six) hours as needed for moderate pain. (Patient not taking: Reported on 04/05/2018) 90 tablet 0  . ondansetron (ZOFRAN) 4 MG tablet Take 1-2 tablets (4-8 mg total) by mouth every 8 (eight) hours as needed for nausea or vomiting. (Patient not taking: Reported on 04/05/2018) 40 tablet 0  . prochlorperazine (COMPAZINE) 10 MG tablet Take 1 tablet (10 mg total) by mouth every 6 (six) hours  as needed for nausea or vomiting. (Patient not taking: Reported on 04/05/2018) 30 tablet 0   No current facility-administered medications for this visit.    Facility-Administered Medications Ordered in Other Visits  Medication Dose Route Frequency Provider Last Rate Last Dose  . bortezomib SQ (VELCADE) chemo injection 3.25 mg  1.3 mg/m2 (Treatment Plan Recorded) Subcutaneous Once Curt Bears, MD      . prochlorperazine (COMPAZINE) tablet 10 mg  10 mg Oral Once Curt Bears, MD        SURGICAL HISTORY:  Past Surgical History:  Procedure Laterality Date  .  CHONDROPLASTY Left 07/09/2016   Procedure: CHONDROPLASTY with MICROFRACTURE;  Surgeon: Leandrew Koyanagi, MD;  Location: [facility];  Service: Orthopedics;  Laterality: Left;  . COLONOSCOPY     x2  . HIP SURGERY     aspiration only, no anesthesia  . KNEE ARTHROSCOPY WITH MEDIAL MENISECTOMY Left 07/09/2016   Procedure: LEFT KNEE ARTHROSCOPY WITH PARTIAL MEDIAL MENISCECTOMY;  Surgeon: Leandrew Koyanagi, MD;  Location: [facility];  Service: Orthopedics;  Laterality: Left;    REVIEW OF SYSTEMS:   Review of Systems  Constitutional: Negative for appetite change, chills, fatigue, fever and unexpected weight change.  HENT:   Negative for mouth sores, nosebleeds, sore throat and trouble swallowing.   Eyes: Negative for eye problems and icterus.  Respiratory: Negative for cough, hemoptysis, shortness of breath and wheezing.   Cardiovascular: Negative for chest pain and leg swelling.  Gastrointestinal: Negative for abdominal pain, constipation, diarrhea, nausea and vomiting.  Genitourinary: Negative for bladder incontinence, difficulty urinating, dysuria, frequency and hematuria.   Musculoskeletal: Negative for back pain, gait problem, neck pain and neck stiffness.  Skin: Negative for itching and rash.  Neurological: Negative for dizziness, extremity weakness, gait problem, headaches, light-headedness and seizures.  Hematological: Negative for adenopathy. Does not bruise/bleed easily.  Psychiatric/Behavioral: Negative for confusion, depression and sleep disturbance. The patient is not nervous/anxious.     PHYSICAL EXAMINATION:  Blood pressure (!) 167/89, pulse (!) 111, temperature 98 F (36.7 C), temperature source Oral, resp. rate 18, height 6' 3" (1.905 m), weight 266 lb 8 oz (120.9 kg), SpO2 97 %.  ECOG PERFORMANCE STATUS: 1 - Symptomatic but completely ambulatory  Physical Exam  Constitutional: Oriented to person, place, and time and well-developed, well-nourished, and in  no distress. No distress.  HENT:  Head: Normocephalic and atraumatic.  Mouth/Throat: Oropharynx is clear and moist. No oropharyngeal exudate.  Eyes: Conjunctivae are normal. Right eye exhibits no discharge. Left eye exhibits no discharge. No scleral icterus.  Neck: Normal range of motion. Neck supple.  Cardiovascular: Normal rate, regular rhythm, normal heart sounds and intact distal pulses.   Pulmonary/Chest: Effort normal and breath sounds normal. No respiratory distress. No wheezes. No rales.  Abdominal: Soft. Bowel sounds are normal. Exhibits no distension and no mass. There is no tenderness.  Musculoskeletal: Normal range of motion. Exhibits no edema.  Lymphadenopathy:    No cervical adenopathy.  Neurological: Alert and oriented to person, place, and time. Exhibits normal muscle tone. Gait normal. Coordination normal.  Skin: Skin is warm and dry. No rash noted. Not diaphoretic. No erythema. No pallor.  Psychiatric: Mood, memory and judgment normal.  Vitals reviewed.  LABORATORY DATA: Lab Results  Component Value Date   WBC 7.5 05/17/2018   HGB 13.2 05/17/2018   HCT 40.0 05/17/2018   MCV 101.0 (H) 05/17/2018   PLT 213 05/17/2018      Chemistry  Component Value Date/Time   NA 143 05/17/2018 1300   K 3.7 05/17/2018 1300   CL 106 05/17/2018 1300   CO2 25 05/17/2018 1300   BUN 12 05/17/2018 1300   CREATININE 1.14 05/17/2018 1300      Component Value Date/Time   CALCIUM 9.0 05/17/2018 1300   ALKPHOS 76 05/17/2018 1300   AST 14 (L) 05/17/2018 1300   ALT 15 05/17/2018 1300   BILITOT 0.3 05/17/2018 1300       RADIOGRAPHIC STUDIES:  No results found.   ASSESSMENT/PLAN:  This is a very pleasant 68 year old American male who was recently diagnosed with multiple myeloma, IgG subtype.  He was diagnosed in January 2020. He is currently undergoing treatment with Velcade 1.3 mg/m on days 1, 8, and 15 every 4 weeks in addition to Revlimid 25 mg p.o. daily for 21 days  every 4 weeks.  He also takes 40 mg of Decadron p.o. weekly.  He is status post 2 cycles.  The patient recently had a repeat myeloma panel performed including a CMP, CBC, LDH, QIG, Beta 2 microglobulin, and kappa/lambda light chain.  The patient was seen with Dr. Julien Nordmann today. Dr. Julien Nordmann reviewed the lab results with the patient which showed significant improvement in his disease. Dr. Julien Nordmann recommends the patient proceed with 2 more cycles of treatment and then obtaining a repeat myeloma panel. At that time, we will reassess whether to refer the patient to a tertiary center for consideration of an autologous stem cell transplant.   We will see the patient back in 4 weeks prior to starting cycle #4.   Patient advised to continue taking his prophylactic coumadin 2 mg and acyclovir 200 mg po. BID.   For anxiety, he will continue taking the 0.25 mg of Xanax as needed. Reinforced that this medication is to be used on a as needed basis. The patient expressed understanding.  The patient's blood pressure continues to be elevated secondary to anxiety. I advised the patient to keep a log of his blood pressures at home and to follow up with his PCP if they continue to be elevated.  The patient was advised to call immediately if he has any concerning symptoms in the interval. The patient voices understanding of current disease status and treatment options and is in agreement with the current care plan. All questions were answered. The patient knows to call the clinic with any problems, questions or concerns. We can certainly see the patient much sooner if necessary  No orders of the defined types were placed in this encounter.    Cassandra L Heilingoetter, PA-C 05/17/18  ADDENDUM: Hematology/Oncology Attending: I had a face-to-face encounter with the patient today.  I recommended his care plan.  This is a very pleasant 68 years old African-American man with multiple myeloma, IgG subtype diagnosed in  January 2020.  He is currently undergoing systemic chemotherapy with weekly subcutaneous Velcade, Revlimid and Decadron status post 2 cycles. The patient has been tolerating this treatment well with no concerning adverse effects. He had repeat myeloma panel performed recently.  I discussed the lab results with the patient today. His myeloma panel showed significant improvement in his disease. I recommended for the patient to proceed with 2 more cycles of Velcade, Revlimid and Decadron before repeating myeloma panel. We may consider referring him for autologous stem cell transplant after cycle #4 #6 depending on the situation of the [place] pandemic. For anxiety, I will continue with Xanax for now. The patient will come back  for follow-up visit in 4 weeks for evaluation before starting cycle #4. He was advised to call immediately if he has any concerning symptoms in the interval.  Disclaimer: This note was dictated with voice recognition software. Similar sounding words can inadvertently be transcribed and may be missed upon review. Eilleen Kempf, MD 05/17/18

## 2018-05-17 NOTE — Patient Instructions (Signed)
[facility] Discharge Instructions for Patients Receiving Chemotherapy  Today you received the following chemotherapy agents Velcade.  To help prevent nausea and vomiting after your treatment, we encourage you to take your nausea medication as directed.  If you develop nausea and vomiting that is not controlled by your nausea medication, call the clinic.   BELOW ARE SYMPTOMS THAT SHOULD BE REPORTED IMMEDIATELY:  *FEVER GREATER THAN 100.5 F  *CHILLS WITH OR WITHOUT FEVER  NAUSEA AND VOMITING THAT IS NOT CONTROLLED WITH YOUR NAUSEA MEDICATION  *UNUSUAL SHORTNESS OF BREATH  *UNUSUAL BRUISING OR BLEEDING  TENDERNESS IN MOUTH AND THROAT WITH OR WITHOUT PRESENCE OF ULCERS  *URINARY PROBLEMS  *BOWEL PROBLEMS  UNUSUAL RASH Items with * indicate a potential emergency and should be followed up as soon as possible.  Feel free to call the clinic should you have any questions or concerns. The clinic phone number is [phone].  Please show the CHEMO ALERT CARD at check-in to the Emergency Department and triage nurse.   

## 2018-05-20 ENCOUNTER — Other Ambulatory Visit: Payer: Self-pay | Admitting: Physician Assistant

## 2018-05-20 DIAGNOSIS — F419 Anxiety disorder, unspecified: Secondary | ICD-10-CM

## 2018-05-20 MED ORDER — ALPRAZOLAM 0.25 MG PO TABS: 0.2500 mg | ORAL_TABLET | Freq: Every evening | ORAL | 0 refills | Status: DC | PRN

## 2018-05-24 ENCOUNTER — Inpatient Hospital Stay: Payer: BLUE CROSS/BLUE SHIELD

## 2018-05-24 ENCOUNTER — Other Ambulatory Visit: Payer: Self-pay

## 2018-05-24 ENCOUNTER — Ambulatory Visit: Payer: BLUE CROSS/BLUE SHIELD | Admitting: Internal Medicine

## 2018-05-24 VITALS — BP 148/68 | HR 73 | Temp 98.1°F | Resp 19

## 2018-05-24 DIAGNOSIS — C9000 Multiple myeloma not having achieved remission: Secondary | ICD-10-CM | POA: Diagnosis not present

## 2018-05-24 LAB — CBC WITH DIFFERENTIAL (CANCER CENTER ONLY)
Abs Immature Granulocytes: 0.01 10*3/uL (ref 0.00–0.07)
Basophils Absolute: 0 10*3/uL (ref 0.0–0.1)
Basophils Relative: 0 %
Eosinophils Absolute: 0.2 10*3/uL (ref 0.0–0.5)
Eosinophils Relative: 4 %
HCT: 38.8 % — ABNORMAL LOW (ref 39.0–52.0)
Hemoglobin: 13.2 g/dL (ref 13.0–17.0)
Immature Granulocytes: 0 %
Lymphocytes Relative: 60 %
Lymphs Abs: 3.5 10*3/uL (ref 0.7–4.0)
MCH: 33.2 pg (ref 26.0–34.0)
MCHC: 34 g/dL (ref 30.0–36.0)
MCV: 97.5 fL (ref 80.0–100.0)
Monocytes Absolute: 0.6 10*3/uL (ref 0.1–1.0)
Monocytes Relative: 10 %
Neutro Abs: 1.5 10*3/uL — ABNORMAL LOW (ref 1.7–7.7)
Neutrophils Relative %: 26 %
Platelet Count: 183 10*3/uL (ref 150–400)
RBC: 3.98 MIL/uL — ABNORMAL LOW (ref 4.22–5.81)
RDW: 14.1 % (ref 11.5–15.5)
WBC Count: 5.8 10*3/uL (ref 4.0–10.5)
nRBC: 0 % (ref 0.0–0.2)

## 2018-05-24 LAB — CMP (CANCER CENTER ONLY)
ALT: 13 U/L (ref 0–44)
AST: 9 U/L — ABNORMAL LOW (ref 15–41)
Albumin: 3.3 g/dL — ABNORMAL LOW (ref 3.5–5.0)
Alkaline Phosphatase: 75 U/L (ref 38–126)
Anion gap: 10 (ref 5–15)
BUN: 12 mg/dL (ref 8–23)
CO2: 25 mmol/L (ref 22–32)
Calcium: 8.7 mg/dL — ABNORMAL LOW (ref 8.9–10.3)
Chloride: 105 mmol/L (ref 98–111)
Creatinine: 1.02 mg/dL (ref 0.61–1.24)
GFR, Est AFR Am: >60 mL/min (ref >60)
GFR, Estimated: >60 mL/min (ref >60)
Glucose, Bld: 98 mg/dL (ref 70–99)
Potassium: 3.7 mmol/L (ref 3.5–5.1)
Sodium: 140 mmol/L (ref 135–145)
Total Bilirubin: 0.3 mg/dL (ref 0.3–1.2)
Total Protein: 7.1 g/dL (ref 6.5–8.1)

## 2018-05-24 MED ORDER — BORTEZOMIB CHEMO SQ INJECTION 3.5 MG (2.5MG/ML)
1.3000 mg/m2 | Freq: Once | INTRAMUSCULAR | Status: AC
  Administered 2018-05-24: 3.25 mg via SUBCUTANEOUS
  Filled 2018-05-24: qty 1.3

## 2018-05-31 ENCOUNTER — Other Ambulatory Visit: Payer: Self-pay

## 2018-05-31 ENCOUNTER — Inpatient Hospital Stay: Payer: BLUE CROSS/BLUE SHIELD

## 2018-05-31 VITALS — BP 141/75 | HR 71 | Temp 98.1°F | Resp 18

## 2018-05-31 DIAGNOSIS — C9000 Multiple myeloma not having achieved remission: Secondary | ICD-10-CM | POA: Diagnosis not present

## 2018-05-31 LAB — CBC WITH DIFFERENTIAL (CANCER CENTER ONLY)
Abs Immature Granulocytes: 0.02 10*3/uL (ref 0.00–0.07)
Basophils Absolute: 0 10*3/uL (ref 0.0–0.1)
Basophils Relative: 1 %
Eosinophils Absolute: 0.3 10*3/uL (ref 0.0–0.5)
Eosinophils Relative: 5 %
HCT: 38.6 % — ABNORMAL LOW (ref 39.0–52.0)
Hemoglobin: 13.1 g/dL (ref 13.0–17.0)
Immature Granulocytes: 0 %
Lymphocytes Relative: 47 %
Lymphs Abs: 2.7 10*3/uL (ref 0.7–4.0)
MCH: 32.8 pg (ref 26.0–34.0)
MCHC: 33.9 g/dL (ref 30.0–36.0)
MCV: 96.7 fL (ref 80.0–100.0)
Monocytes Absolute: 0.8 10*3/uL (ref 0.1–1.0)
Monocytes Relative: 14 %
Neutro Abs: 1.9 10*3/uL (ref 1.7–7.7)
Neutrophils Relative %: 33 %
Platelet Count: 143 10*3/uL — ABNORMAL LOW (ref 150–400)
RBC: 3.99 MIL/uL — ABNORMAL LOW (ref 4.22–5.81)
RDW: 13.9 % (ref 11.5–15.5)
WBC Count: 5.8 10*3/uL (ref 4.0–10.5)
nRBC: 0 % (ref 0.0–0.2)

## 2018-05-31 LAB — CMP (CANCER CENTER ONLY)
ALT: 14 U/L (ref 0–44)
AST: 10 U/L — ABNORMAL LOW (ref 15–41)
Albumin: 3.3 g/dL — ABNORMAL LOW (ref 3.5–5.0)
Alkaline Phosphatase: 74 U/L (ref 38–126)
Anion gap: 11 (ref 5–15)
BUN: 10 mg/dL (ref 8–23)
CO2: 25 mmol/L (ref 22–32)
Calcium: 8.6 mg/dL — ABNORMAL LOW (ref 8.9–10.3)
Chloride: 105 mmol/L (ref 98–111)
Creatinine: 1.04 mg/dL (ref 0.61–1.24)
GFR, Est AFR Am: >60 mL/min (ref >60)
GFR, Estimated: >60 mL/min (ref >60)
Glucose, Bld: 90 mg/dL (ref 70–99)
Potassium: 3.6 mmol/L (ref 3.5–5.1)
Sodium: 141 mmol/L (ref 135–145)
Total Bilirubin: 0.2 mg/dL — ABNORMAL LOW (ref 0.3–1.2)
Total Protein: 6.8 g/dL (ref 6.5–8.1)

## 2018-05-31 MED ORDER — PROCHLORPERAZINE MALEATE 10 MG PO TABS: 10.0000 mg | ORAL_TABLET | Freq: Once | ORAL | Status: DC

## 2018-05-31 MED ORDER — BORTEZOMIB CHEMO SQ INJECTION 3.5 MG (2.5MG/ML)
1.3000 mg/m2 | Freq: Once | INTRAMUSCULAR | Status: AC
  Administered 2018-05-31: 3.25 mg via SUBCUTANEOUS
  Filled 2018-05-31: qty 1.3

## 2018-05-31 NOTE — Patient Instructions (Signed)
[facility] Discharge Instructions for Patients Receiving Chemotherapy  Today you received the following chemotherapy agents Velcade.  To help prevent nausea and vomiting after your treatment, we encourage you to take your nausea medication as directed.  If you develop nausea and vomiting that is not controlled by your nausea medication, call the clinic.   BELOW ARE SYMPTOMS THAT SHOULD BE REPORTED IMMEDIATELY:  *FEVER GREATER THAN 100.5 F  *CHILLS WITH OR WITHOUT FEVER  NAUSEA AND VOMITING THAT IS NOT CONTROLLED WITH YOUR NAUSEA MEDICATION  *UNUSUAL SHORTNESS OF BREATH  *UNUSUAL BRUISING OR BLEEDING  TENDERNESS IN MOUTH AND THROAT WITH OR WITHOUT PRESENCE OF ULCERS  *URINARY PROBLEMS  *BOWEL PROBLEMS  UNUSUAL RASH Items with * indicate a potential emergency and should be followed up as soon as possible.  Feel free to call the clinic should you have any questions or concerns. The clinic phone number is [phone].  Please show the CHEMO ALERT CARD at check-in to the Emergency Department and triage nurse.   

## 2018-06-07 ENCOUNTER — Other Ambulatory Visit: Payer: Self-pay | Admitting: Medical Oncology

## 2018-06-07 ENCOUNTER — Other Ambulatory Visit: Payer: Self-pay | Admitting: Physician Assistant

## 2018-06-07 ENCOUNTER — Other Ambulatory Visit: Payer: BLUE CROSS/BLUE SHIELD

## 2018-06-07 ENCOUNTER — Ambulatory Visit: Payer: BLUE CROSS/BLUE SHIELD

## 2018-06-07 DIAGNOSIS — C9000 Multiple myeloma not having achieved remission: Secondary | ICD-10-CM

## 2018-06-07 MED ORDER — LENALIDOMIDE 25 MG PO CAPS: ORAL_CAPSULE | ORAL | 99 refills | Status: DC

## 2018-06-08 ENCOUNTER — Telehealth: Payer: Self-pay | Admitting: Medical Oncology

## 2018-06-08 NOTE — Telephone Encounter (Signed)
Faxed  revlimid refill. 

## 2018-06-14 ENCOUNTER — Other Ambulatory Visit: Payer: Self-pay | Admitting: Internal Medicine

## 2018-06-14 ENCOUNTER — Other Ambulatory Visit: Payer: Self-pay

## 2018-06-14 ENCOUNTER — Inpatient Hospital Stay: Payer: BC Managed Care – PPO

## 2018-06-14 ENCOUNTER — Inpatient Hospital Stay: Payer: BC Managed Care – PPO | Attending: Internal Medicine

## 2018-06-14 ENCOUNTER — Inpatient Hospital Stay (HOSPITAL_BASED_OUTPATIENT_CLINIC_OR_DEPARTMENT_OTHER): Payer: BC Managed Care – PPO | Admitting: Internal Medicine

## 2018-06-14 ENCOUNTER — Encounter: Payer: Self-pay | Admitting: Internal Medicine

## 2018-06-14 VITALS — BP 146/91 | HR 89 | Temp 98.7°F | Resp 18 | Ht 75.0 in | Wt 265.7 lb

## 2018-06-14 DIAGNOSIS — Z5112 Encounter for antineoplastic immunotherapy: Secondary | ICD-10-CM | POA: Insufficient documentation

## 2018-06-14 DIAGNOSIS — C9000 Multiple myeloma not having achieved remission: Secondary | ICD-10-CM

## 2018-06-14 DIAGNOSIS — Z5111 Encounter for antineoplastic chemotherapy: Secondary | ICD-10-CM

## 2018-06-14 DIAGNOSIS — F419 Anxiety disorder, unspecified: Secondary | ICD-10-CM

## 2018-06-14 LAB — CMP (CANCER CENTER ONLY)
ALT: 15 U/L (ref 0–44)
AST: 10 U/L — ABNORMAL LOW (ref 15–41)
Albumin: 3.5 g/dL (ref 3.5–5.0)
Alkaline Phosphatase: 75 U/L (ref 38–126)
Anion gap: 8 (ref 5–15)
BUN: 14 mg/dL (ref 8–23)
CO2: 27 mmol/L (ref 22–32)
Calcium: 9 mg/dL (ref 8.9–10.3)
Chloride: 106 mmol/L (ref 98–111)
Creatinine: 1 mg/dL (ref 0.61–1.24)
GFR, Est AFR Am: >60 mL/min (ref >60)
GFR, Estimated: >60 mL/min (ref >60)
Glucose, Bld: 94 mg/dL (ref 70–99)
Potassium: 3.5 mmol/L (ref 3.5–5.1)
Sodium: 141 mmol/L (ref 135–145)
Total Bilirubin: 0.3 mg/dL (ref 0.3–1.2)
Total Protein: 7.2 g/dL (ref 6.5–8.1)

## 2018-06-14 LAB — CBC WITH DIFFERENTIAL (CANCER CENTER ONLY)
Abs Immature Granulocytes: 0.01 10*3/uL (ref 0.00–0.07)
Basophils Absolute: 0.1 10*3/uL (ref 0.0–0.1)
Basophils Relative: 2 %
Eosinophils Absolute: 0.2 10*3/uL (ref 0.0–0.5)
Eosinophils Relative: 4 %
HCT: 38.8 % — ABNORMAL LOW (ref 39.0–52.0)
Hemoglobin: 12.9 g/dL — ABNORMAL LOW (ref 13.0–17.0)
Immature Granulocytes: 0 %
Lymphocytes Relative: 38 %
Lymphs Abs: 2.6 10*3/uL (ref 0.7–4.0)
MCH: 32.9 pg (ref 26.0–34.0)
MCHC: 33.2 g/dL (ref 30.0–36.0)
MCV: 99 fL (ref 80.0–100.0)
Monocytes Absolute: 0.9 10*3/uL (ref 0.1–1.0)
Monocytes Relative: 13 %
Neutro Abs: 3 10*3/uL (ref 1.7–7.7)
Neutrophils Relative %: 43 %
Platelet Count: 247 10*3/uL (ref 150–400)
RBC: 3.92 MIL/uL — ABNORMAL LOW (ref 4.22–5.81)
RDW: 14.1 % (ref 11.5–15.5)
WBC Count: 6.8 10*3/uL (ref 4.0–10.5)
nRBC: 0 % (ref 0.0–0.2)

## 2018-06-14 MED ORDER — PROCHLORPERAZINE MALEATE 10 MG PO TABS: 10.0000 mg | ORAL_TABLET | Freq: Once | ORAL | Status: DC

## 2018-06-14 MED ORDER — BORTEZOMIB CHEMO SQ INJECTION 3.5 MG (2.5MG/ML)
1.3000 mg/m2 | Freq: Once | INTRAMUSCULAR | Status: AC
  Administered 2018-06-14: 17:00:00 3.25 mg via SUBCUTANEOUS
  Filled 2018-06-14: qty 1.3

## 2018-06-14 NOTE — Progress Notes (Signed)
[facility] [phone]   Fax:([phone]) [phone]  OFFICE PROGRESS NOTE  Derinda Late, MD [phone] [facility] [place] [address]  DIAGNOSIS: Multiple myeloma, IgG subtype diagnosed in January 2020.  PRIOR THERAPY: None  CURRENT THERAPY: Systemic chemotherapy with Velcade 1.3 mg/M2 on days 1, 8, 15 as well as Revlimid 25 mg p.o. daily for 21 days every 4 weeks as well as Decadron 40 mg weekly.  First dose expected March 22, 2018.  Status post 3 cycles.  INTERVAL HISTORY: Matthew Yates 68 y.o. male returns to the clinic today for follow-up visit.  The patient is feeling fine today with no concerning complaints except for anxiety dealing with the concern about the COVID-19 pandemic.  He denied having any chest pain, shortness of breath, cough or hemoptysis.  He denied having any fever or chills.  He has no nausea, vomiting, diarrhea or constipation.  He denied having any headache or visual changes.  He has been tolerating his systemic chemotherapy fairly well.  He is here today for evaluation before starting cycle #4.  MEDICAL HISTORY: Past Medical History:  Diagnosis Date  . Acute medial meniscus tear of left knee   . Arthritis    lt knee  . GERD (gastroesophageal reflux disease)   . Hypertension     ALLERGIES:  is allergic to other.  MEDICATIONS:  Current Outpatient Medications  Medication Sig Dispense Refill  . acyclovir (ZOVIRAX) 200 MG capsule Take 1 capsule (200 mg total) by mouth 2 (two) times daily. 60 capsule 2  . ALPRAZolam (XANAX) 0.25 MG tablet Take 1 tablet (0.25 mg total) by mouth at bedtime as needed for anxiety. 30 tablet 0  . amLODipine (NORVASC) 5 MG tablet Take by mouth.    . dexamethasone (DECADRON) 4 MG tablet 10 tab po once weekly with chemo 80 tablet 2  . esomeprazole (NEXIUM) 20 MG capsule Take 1 capsule (20 mg total) by mouth daily before breakfast. 30 capsule 1  .  HYDROcodone-acetaminophen (NORCO) 7.5-325 MG tablet Take 1-2 tablets by mouth every 6 (six) hours as needed for moderate pain. (Patient not taking: Reported on 04/05/2018) 90 tablet 0  . ibuprofen (ADVIL,MOTRIN) 400 MG tablet Take 400 mg by mouth every 6 (six) hours as needed.    Marland Kitchen lenalidomide (REVLIMID) 25 MG capsule TAKE 1 CAPSULE BY MOUTH DAILY FOR 21 DAYS THEN TAKE 7 DAYS OFF. REPEAT EVERY 28 DAYS. 06/07/2018 auth number [id number] adult male. 21 capsule PRN  . loratadine-pseudoephedrine (CLARITIN-D 12-HOUR) 5-120 MG tablet Take 1 tablet by mouth 2 (two) times daily.    Marland Kitchen LORazepam (ATIVAN) 0.5 MG tablet Take by mouth.    . ondansetron (ZOFRAN) 4 MG tablet Take 1-2 tablets (4-8 mg total) by mouth every 8 (eight) hours as needed for nausea or vomiting. (Patient not taking: Reported on 04/05/2018) 40 tablet 0  . prochlorperazine (COMPAZINE) 10 MG tablet Take 1 tablet (10 mg total) by mouth every 6 (six) hours as needed for nausea or vomiting. (Patient not taking: Reported on 04/05/2018) 30 tablet 0  . senna-docusate (SENOKOT S) 8.6-50 MG tablet Take 1 tablet by mouth at bedtime as needed. 30 tablet 1  . warfarin (COUMADIN) 2 MG tablet Take 1 tablet (2 mg total) by mouth daily. 90 tablet 1   No current facility-administered medications for this visit.     SURGICAL HISTORY:  Past Surgical History:  Procedure Laterality Date  . CHONDROPLASTY  Left 07/09/2016   Procedure: CHONDROPLASTY with MICROFRACTURE;  Surgeon: Leandrew Koyanagi, MD;  Location: [facility];  Service: Orthopedics;  Laterality: Left;  . COLONOSCOPY     x2  . HIP SURGERY     aspiration only, no anesthesia  . KNEE ARTHROSCOPY WITH MEDIAL MENISECTOMY Left 07/09/2016   Procedure: LEFT KNEE ARTHROSCOPY WITH PARTIAL MEDIAL MENISCECTOMY;  Surgeon: Leandrew Koyanagi, MD;  Location: [facility];  Service: Orthopedics;  Laterality: Left;    REVIEW OF SYSTEMS:  A comprehensive review of systems was negative except for:  Behavioral/Psych: positive for anxiety   PHYSICAL EXAMINATION: General appearance: alert, cooperative and no distress Head: Normocephalic, without obvious abnormality, atraumatic Neck: no adenopathy, no JVD, supple, symmetrical, trachea midline and thyroid not enlarged, symmetric, no tenderness/mass/nodules Lymph nodes: Cervical, supraclavicular, and axillary nodes normal. Resp: clear to auscultation bilaterally Back: symmetric, no curvature. ROM normal. No CVA tenderness. Cardio: regular rate and rhythm, S1, S2 normal, no murmur, click, rub or gallop GI: soft, non-tender; bowel sounds normal; no masses,  no organomegaly Extremities: extremities normal, atraumatic, no cyanosis or edema  ECOG PERFORMANCE STATUS: 1 - Symptomatic but completely ambulatory  Blood pressure (!) 146/91, pulse 89, temperature 98.7 F (37.1 C), temperature source Oral, resp. rate 18, height 6' 3"  (1.905 m), weight 265 lb 11.2 oz (120.5 kg), SpO2 98 %.  LABORATORY DATA: Lab Results  Component Value Date   WBC 6.8 06/14/2018   HGB 12.9 (L) 06/14/2018   HCT 38.8 (L) 06/14/2018   MCV 99.0 06/14/2018   PLT 247 06/14/2018      Chemistry      Component Value Date/Time   NA 141 05/31/2018 1422   K 3.6 05/31/2018 1422   CL 105 05/31/2018 1422   CO2 25 05/31/2018 1422   BUN 10 05/31/2018 1422   CREATININE 1.04 05/31/2018 1422      Component Value Date/Time   CALCIUM 8.6 (L) 05/31/2018 1422   ALKPHOS 74 05/31/2018 1422   AST 10 (L) 05/31/2018 1422   ALT 14 05/31/2018 1422   BILITOT 0.2 (L) 05/31/2018 1422       RADIOGRAPHIC STUDIES: No results found.  ASSESSMENT AND PLAN: This is a very pleasant 68 years old African-American male recently diagnosed with multiple myeloma, IgG subtype.  He is currently undergoing treatment with Revlimid, Velcade and Decadron status post 3 cycles. The patient has been tolerating this treatment well with no concerning adverse effects. I recommended for him to proceed  with cycle #4 today as planned. I will see him back for follow-up visit in 4 weeks for evaluation after repeating myeloma panel. For anxiety we will continue his current treatment with Xanax. He was advised to call immediately if he has any concerning symptoms in the interval. The patient voices understanding of current disease status and treatment options and is in agreement with the current care plan.  All questions were answered. The patient knows to call the clinic with any problems, questions or concerns. We can certainly see the patient much sooner if necessary.  I spent 10 minutes counseling the patient face to face. The total time spent in the appointment was 15 minutes.  Disclaimer: This note was dictated with voice recognition software. Similar sounding words can inadvertently be transcribed and may not be corrected upon review.

## 2018-06-14 NOTE — Patient Instructions (Signed)
[facility] Discharge Instructions for Patients Receiving Chemotherapy  Today you received the following chemotherapy agents Velcade  To help prevent nausea and vomiting after your treatment, we encourage you to take your nausea medication: As directed by MD   If you develop nausea and vomiting that is not controlled by your nausea medication, call the clinic.   BELOW ARE SYMPTOMS THAT SHOULD BE REPORTED IMMEDIATELY:  *FEVER GREATER THAN 100.5 F  *CHILLS WITH OR WITHOUT FEVER  NAUSEA AND VOMITING THAT IS NOT CONTROLLED WITH YOUR NAUSEA MEDICATION  *UNUSUAL SHORTNESS OF BREATH  *UNUSUAL BRUISING OR BLEEDING  TENDERNESS IN MOUTH AND THROAT WITH OR WITHOUT PRESENCE OF ULCERS  *URINARY PROBLEMS  *BOWEL PROBLEMS  UNUSUAL RASH Items with * indicate a potential emergency and should be followed up as soon as possible.  Feel free to call the clinic should you have any questions or concerns. The clinic phone number is [phone].  Please show the [facility] at check-in to the Emergency Department and triage nurse.  Coronavirus (COVID-19) Are you at risk?  Are you at risk for the Coronavirus (COVID-19)?  To be considered HIGH RISK for Coronavirus (COVID-19), you have to meet the following criteria:  . Traveled to [place], [place], [place], [place] or [place]; or in the [place] to [place], [place], [place], or [place]; and have fever, cough, and shortness of breath within the last 2 weeks of travel OR . Been in close contact with a person diagnosed with COVID-19 within the last 2 weeks and have fever, cough, and shortness of breath . IF YOU DO NOT MEET THESE CRITERIA, YOU ARE CONSIDERED LOW RISK FOR COVID-19.  What to do if you are HIGH RISK for COVID-19?  Marland Kitchen If you are having a medical emergency, call 911. . Seek medical care right away. Before you go to a doctor's office, urgent care or emergency department, call ahead and tell them about your  recent travel, contact with someone diagnosed with COVID-19, and your symptoms. You should receive instructions from your physician's office regarding next steps of care.  . When you arrive at healthcare provider, tell the healthcare staff immediately you have returned from visiting [place], [place], [place], [place] or [place]; or traveled in the [place] to [place], [place], [place], or [place]; in the last two weeks or you have been in close contact with a person diagnosed with COVID-19 in the last 2 weeks.   . Tell the health care staff about your symptoms: fever, cough and shortness of breath. . After you have been seen by a medical provider, you will be either: o Tested for (COVID-19) and discharged home on quarantine except to seek medical care if symptoms worsen, and asked to  - Stay home and avoid contact with others until you get your results (4-5 days)  - Avoid travel on public transportation if possible (such as bus, train, or airplane) or o Sent to the Emergency Department by EMS for evaluation, COVID-19 testing, and possible admission depending on your condition and test results.  What to do if you are LOW RISK for COVID-19?  Reduce your risk of any infection by using the same precautions used for avoiding the common cold or flu:  Marland Kitchen Wash your hands often with soap and warm water for at least 20 seconds.  If soap and water are not readily available, use an alcohol-based hand sanitizer with at least 60% alcohol.  . If coughing  or sneezing, cover your mouth and nose by coughing or sneezing into the elbow areas of your shirt or coat, into a tissue or into your sleeve (not your hands). . Avoid shaking hands with others and consider head nods or verbal greetings only. . Avoid touching your eyes, nose, or mouth with unwashed hands.  . Avoid close contact with people who are sick. . Avoid places or events with large numbers of people in one location, like concerts or sporting  events. . Carefully consider travel plans you have or are making. . If you are planning any travel outside or inside the [place], visit the CDC's Travelers' Health webpage for the latest health notices. . If you have some symptoms but not all symptoms, continue to monitor at home and seek medical attention if your symptoms worsen. . If you are having a medical emergency, call 911.   [facility] / e-Visit: [email or web address]         [facility]: [phone]  [facility]: [phone]                   [facility]: [phone]  Coronavirus (COVID-19) Are you at risk?  Are you at risk for the Coronavirus (COVID-19)?  To be considered HIGH RISK for Coronavirus (COVID-19), you have to meet the following criteria:  . Traveled to [place], [place], [place], [place] or [place]; or in the [place] to [place], [place], [place], or [place]; and have fever, cough, and shortness of breath within the last 2 weeks of travel OR . Been in close contact with a person diagnosed with COVID-19 within the last 2 weeks and have fever, cough, and shortness of breath . IF YOU DO NOT MEET THESE CRITERIA, YOU ARE CONSIDERED LOW RISK FOR COVID-19.

## 2018-06-15 ENCOUNTER — Telehealth: Payer: Self-pay | Admitting: Internal Medicine

## 2018-06-15 ENCOUNTER — Other Ambulatory Visit: Payer: Self-pay | Admitting: Internal Medicine

## 2018-06-15 NOTE — Telephone Encounter (Signed)
Scheduled appt per 5/11 los -  Scheduled appt per treatment plan . Pt to get an updated schedule next visit,

## 2018-06-21 ENCOUNTER — Inpatient Hospital Stay: Payer: BC Managed Care – PPO

## 2018-06-21 ENCOUNTER — Other Ambulatory Visit: Payer: Self-pay

## 2018-06-21 VITALS — BP 139/76 | HR 78 | Temp 98.5°F | Resp 18

## 2018-06-21 DIAGNOSIS — C9000 Multiple myeloma not having achieved remission: Secondary | ICD-10-CM | POA: Diagnosis not present

## 2018-06-21 LAB — CBC WITH DIFFERENTIAL (CANCER CENTER ONLY)
Abs Immature Granulocytes: 0.01 10*3/uL (ref 0.00–0.07)
Basophils Absolute: 0 10*3/uL (ref 0.0–0.1)
Basophils Relative: 1 %
Eosinophils Absolute: 0.3 10*3/uL (ref 0.0–0.5)
Eosinophils Relative: 6 %
HCT: 39.9 % (ref 39.0–52.0)
Hemoglobin: 13.4 g/dL (ref 13.0–17.0)
Immature Granulocytes: 0 %
Lymphocytes Relative: 48 %
Lymphs Abs: 2.4 10*3/uL (ref 0.7–4.0)
MCH: 32.6 pg (ref 26.0–34.0)
MCHC: 33.6 g/dL (ref 30.0–36.0)
MCV: 97.1 fL (ref 80.0–100.0)
Monocytes Absolute: 0.6 10*3/uL (ref 0.1–1.0)
Monocytes Relative: 11 %
Neutro Abs: 1.7 10*3/uL (ref 1.7–7.7)
Neutrophils Relative %: 34 %
Platelet Count: 165 10*3/uL (ref 150–400)
RBC: 4.11 MIL/uL — ABNORMAL LOW (ref 4.22–5.81)
RDW: 13.9 % (ref 11.5–15.5)
WBC Count: 5 10*3/uL (ref 4.0–10.5)
nRBC: 0 % (ref 0.0–0.2)

## 2018-06-21 LAB — CMP (CANCER CENTER ONLY)
ALT: 14 U/L (ref 0–44)
AST: 12 U/L — ABNORMAL LOW (ref 15–41)
Albumin: 3.5 g/dL (ref 3.5–5.0)
Alkaline Phosphatase: 78 U/L (ref 38–126)
Anion gap: 9 (ref 5–15)
BUN: 11 mg/dL (ref 8–23)
CO2: 27 mmol/L (ref 22–32)
Calcium: 9.1 mg/dL (ref 8.9–10.3)
Chloride: 104 mmol/L (ref 98–111)
Creatinine: 1.09 mg/dL (ref 0.61–1.24)
GFR, Est AFR Am: >60 mL/min (ref >60)
GFR, Estimated: >60 mL/min (ref >60)
Glucose, Bld: 98 mg/dL (ref 70–99)
Potassium: 3.7 mmol/L (ref 3.5–5.1)
Sodium: 140 mmol/L (ref 135–145)
Total Bilirubin: 0.3 mg/dL (ref 0.3–1.2)
Total Protein: 7.1 g/dL (ref 6.5–8.1)

## 2018-06-21 MED ORDER — BORTEZOMIB CHEMO SQ INJECTION 3.5 MG (2.5MG/ML)
1.3000 mg/m2 | Freq: Once | INTRAMUSCULAR | Status: AC
  Administered 2018-06-21: 16:00:00 3.25 mg via SUBCUTANEOUS
  Filled 2018-06-21: qty 1.3

## 2018-06-21 NOTE — Progress Notes (Signed)
Pt states he took compazine at home.

## 2018-06-29 ENCOUNTER — Inpatient Hospital Stay: Payer: BC Managed Care – PPO

## 2018-06-29 ENCOUNTER — Other Ambulatory Visit: Payer: Self-pay

## 2018-06-29 VITALS — BP 141/82 | HR 81 | Temp 98.5°F

## 2018-06-29 DIAGNOSIS — C9000 Multiple myeloma not having achieved remission: Secondary | ICD-10-CM

## 2018-06-29 LAB — CBC WITH DIFFERENTIAL (CANCER CENTER ONLY)
Abs Immature Granulocytes: 0.02 10*3/uL (ref 0.00–0.07)
Basophils Absolute: 0 10*3/uL (ref 0.0–0.1)
Basophils Relative: 1 %
Eosinophils Absolute: 0.4 10*3/uL (ref 0.0–0.5)
Eosinophils Relative: 6 %
HCT: 40.5 % (ref 39.0–52.0)
Hemoglobin: 13.7 g/dL (ref 13.0–17.0)
Immature Granulocytes: 0 %
Lymphocytes Relative: 39 %
Lymphs Abs: 2.5 10*3/uL (ref 0.7–4.0)
MCH: 32.5 pg (ref 26.0–34.0)
MCHC: 33.8 g/dL (ref 30.0–36.0)
MCV: 96.2 fL (ref 80.0–100.0)
Monocytes Absolute: 0.8 10*3/uL (ref 0.1–1.0)
Monocytes Relative: 12 %
Neutro Abs: 2.7 10*3/uL (ref 1.7–7.7)
Neutrophils Relative %: 42 %
Platelet Count: 177 10*3/uL (ref 150–400)
RBC: 4.21 MIL/uL — ABNORMAL LOW (ref 4.22–5.81)
RDW: 13.8 % (ref 11.5–15.5)
WBC Count: 6.4 10*3/uL (ref 4.0–10.5)
nRBC: 0 % (ref 0.0–0.2)

## 2018-06-29 LAB — CMP (CANCER CENTER ONLY)
ALT: 24 U/L (ref 0–44)
AST: 14 U/L — ABNORMAL LOW (ref 15–41)
Albumin: 3.6 g/dL (ref 3.5–5.0)
Alkaline Phosphatase: 68 U/L (ref 38–126)
Anion gap: 10 (ref 5–15)
BUN: 12 mg/dL (ref 8–23)
CO2: 26 mmol/L (ref 22–32)
Calcium: 9 mg/dL (ref 8.9–10.3)
Chloride: 104 mmol/L (ref 98–111)
Creatinine: 1.01 mg/dL (ref 0.61–1.24)
GFR, Est AFR Am: >60 mL/min (ref >60)
GFR, Estimated: >60 mL/min (ref >60)
Glucose, Bld: 116 mg/dL — ABNORMAL HIGH (ref 70–99)
Potassium: 3.6 mmol/L (ref 3.5–5.1)
Sodium: 140 mmol/L (ref 135–145)
Total Bilirubin: 0.4 mg/dL (ref 0.3–1.2)
Total Protein: 7.1 g/dL (ref 6.5–8.1)

## 2018-06-29 LAB — LACTATE DEHYDROGENASE: LDH: 130 U/L (ref 98–192)

## 2018-06-29 MED ORDER — BORTEZOMIB CHEMO SQ INJECTION 3.5 MG (2.5MG/ML)
1.3000 mg/m2 | Freq: Once | INTRAMUSCULAR | Status: AC
  Administered 2018-06-29: 3.25 mg via SUBCUTANEOUS
  Filled 2018-06-29: qty 1.3

## 2018-06-29 MED ORDER — PROCHLORPERAZINE MALEATE 10 MG PO TABS: 10.0000 mg | ORAL_TABLET | Freq: Once | ORAL | Status: DC

## 2018-06-29 NOTE — Patient Instructions (Signed)
[facility] Discharge Instructions for Patients Receiving Chemotherapy  Today you received the following chemotherapy agents Velcade  To help prevent nausea and vomiting after your treatment, we encourage you to take your nausea medication: As directed by MD   If you develop nausea and vomiting that is not controlled by your nausea medication, call the clinic.   BELOW ARE SYMPTOMS THAT SHOULD BE REPORTED IMMEDIATELY:  *FEVER GREATER THAN 100.5 F  *CHILLS WITH OR WITHOUT FEVER  NAUSEA AND VOMITING THAT IS NOT CONTROLLED WITH YOUR NAUSEA MEDICATION  *UNUSUAL SHORTNESS OF BREATH  *UNUSUAL BRUISING OR BLEEDING  TENDERNESS IN MOUTH AND THROAT WITH OR WITHOUT PRESENCE OF ULCERS  *URINARY PROBLEMS  *BOWEL PROBLEMS  UNUSUAL RASH Items with * indicate a potential emergency and should be followed up as soon as possible.  Feel free to call the clinic should you have any questions or concerns. The clinic phone number is [phone].  Please show the [facility] at check-in to the Emergency Department and triage nurse.  Coronavirus (COVID-19) Are you at risk?  Are you at risk for the Coronavirus (COVID-19)?  To be considered HIGH RISK for Coronavirus (COVID-19), you have to meet the following criteria:  . Traveled to [place], [place], [place], [place] or [place]; or in the [place] to [place], [place], [place], or [place]; and have fever, cough, and shortness of breath within the last 2 weeks of travel OR . Been in close contact with a person diagnosed with COVID-19 within the last 2 weeks and have fever, cough, and shortness of breath . IF YOU DO NOT MEET THESE CRITERIA, YOU ARE CONSIDERED LOW RISK FOR COVID-19.  What to do if you are HIGH RISK for COVID-19?  Marland Kitchen If you are having a medical emergency, call 911. . Seek medical care right away. Before you go to a doctor's office, urgent care or emergency department, call ahead and tell them about your  recent travel, contact with someone diagnosed with COVID-19, and your symptoms. You should receive instructions from your physician's office regarding next steps of care.  . When you arrive at healthcare provider, tell the healthcare staff immediately you have returned from visiting [place], [place], [place], [place] or [place]; or traveled in the [place] to [place], [place], [place], or [place]; in the last two weeks or you have been in close contact with a person diagnosed with COVID-19 in the last 2 weeks.   . Tell the health care staff about your symptoms: fever, cough and shortness of breath. . After you have been seen by a medical provider, you will be either: o Tested for (COVID-19) and discharged home on quarantine except to seek medical care if symptoms worsen, and asked to  - Stay home and avoid contact with others until you get your results (4-5 days)  - Avoid travel on public transportation if possible (such as bus, train, or airplane) or o Sent to the Emergency Department by EMS for evaluation, COVID-19 testing, and possible admission depending on your condition and test results.  What to do if you are LOW RISK for COVID-19?  Reduce your risk of any infection by using the same precautions used for avoiding the common cold or flu:  Marland Kitchen Wash your hands often with soap and warm water for at least 20 seconds.  If soap and water are not readily available, use an alcohol-based hand sanitizer with at least 60% alcohol.  . If coughing  or sneezing, cover your mouth and nose by coughing or sneezing into the elbow areas of your shirt or coat, into a tissue or into your sleeve (not your hands). . Avoid shaking hands with others and consider head nods or verbal greetings only. . Avoid touching your eyes, nose, or mouth with unwashed hands.  . Avoid close contact with people who are sick. . Avoid places or events with large numbers of people in one location, like concerts or sporting  events. . Carefully consider travel plans you have or are making. . If you are planning any travel outside or inside the [place], visit the CDC's Travelers' Health webpage for the latest health notices. . If you have some symptoms but not all symptoms, continue to monitor at home and seek medical attention if your symptoms worsen. . If you are having a medical emergency, call 911.   [facility] / e-Visit: [email or web address]         [facility]: [phone]  [facility]: [phone]                   [facility]: [phone]  Coronavirus (COVID-19) Are you at risk?  Are you at risk for the Coronavirus (COVID-19)?  To be considered HIGH RISK for Coronavirus (COVID-19), you have to meet the following criteria:  . Traveled to [place], [place], [place], [place] or [place]; or in the [place] to [place], [place], [place], or [place]; and have fever, cough, and shortness of breath within the last 2 weeks of travel OR . Been in close contact with a person diagnosed with COVID-19 within the last 2 weeks and have fever, cough, and shortness of breath . IF YOU DO NOT MEET THESE CRITERIA, YOU ARE CONSIDERED LOW RISK FOR COVID-19.

## 2018-06-30 LAB — BETA 2 MICROGLOBULIN, SERUM: Beta-2 Microglobulin: 1.5 mg/L (ref 0.6–2.4)

## 2018-06-30 LAB — KAPPA/LAMBDA LIGHT CHAINS
Kappa free light chain: 96.9 mg/L — ABNORMAL HIGH (ref 3.3–19.4)
Kappa, lambda light chain ratio: 16.71 — ABNORMAL HIGH (ref 0.26–1.65)
Lambda free light chains: 5.8 mg/L (ref 5.7–26.3)

## 2018-06-30 LAB — IGG, IGA, IGM
IgA: 52 mg/dL — ABNORMAL LOW (ref 61–437)
IgG (Immunoglobin G), Serum: 1469 mg/dL (ref 603–1613)
IgM (Immunoglobulin M), Srm: 41 mg/dL (ref 20–172)

## 2018-07-02 ENCOUNTER — Other Ambulatory Visit: Payer: Self-pay | Admitting: Internal Medicine

## 2018-07-02 DIAGNOSIS — C9000 Multiple myeloma not having achieved remission: Secondary | ICD-10-CM

## 2018-07-08 ENCOUNTER — Other Ambulatory Visit: Payer: Self-pay | Admitting: Medical Oncology

## 2018-07-08 DIAGNOSIS — C9000 Multiple myeloma not having achieved remission: Secondary | ICD-10-CM

## 2018-07-08 MED ORDER — LENALIDOMIDE 25 MG PO CAPS: ORAL_CAPSULE | ORAL | 0 refills | Status: DC

## 2018-07-12 ENCOUNTER — Other Ambulatory Visit: Payer: Self-pay

## 2018-07-12 ENCOUNTER — Inpatient Hospital Stay: Payer: BC Managed Care – PPO

## 2018-07-12 ENCOUNTER — Encounter: Payer: Self-pay | Admitting: Internal Medicine

## 2018-07-12 ENCOUNTER — Other Ambulatory Visit: Payer: Self-pay | Admitting: Internal Medicine

## 2018-07-12 ENCOUNTER — Inpatient Hospital Stay (HOSPITAL_BASED_OUTPATIENT_CLINIC_OR_DEPARTMENT_OTHER): Payer: BC Managed Care – PPO | Admitting: Internal Medicine

## 2018-07-12 ENCOUNTER — Inpatient Hospital Stay: Payer: BC Managed Care – PPO | Attending: Internal Medicine

## 2018-07-12 VITALS — BP 139/69 | HR 92 | Temp 98.3°F | Resp 18 | Ht 75.0 in | Wt 263.3 lb

## 2018-07-12 DIAGNOSIS — Z79899 Other long term (current) drug therapy: Secondary | ICD-10-CM

## 2018-07-12 DIAGNOSIS — Z5112 Encounter for antineoplastic immunotherapy: Secondary | ICD-10-CM | POA: Diagnosis present

## 2018-07-12 DIAGNOSIS — C9000 Multiple myeloma not having achieved remission: Secondary | ICD-10-CM

## 2018-07-12 DIAGNOSIS — F419 Anxiety disorder, unspecified: Secondary | ICD-10-CM | POA: Diagnosis not present

## 2018-07-12 DIAGNOSIS — G620 Drug-induced polyneuropathy: Secondary | ICD-10-CM | POA: Diagnosis not present

## 2018-07-12 DIAGNOSIS — Z5111 Encounter for antineoplastic chemotherapy: Secondary | ICD-10-CM

## 2018-07-12 DIAGNOSIS — Z9484 Stem cells transplant status: Secondary | ICD-10-CM

## 2018-07-12 LAB — CMP (CANCER CENTER ONLY)
ALT: 17 U/L (ref 0–44)
AST: 13 U/L — ABNORMAL LOW (ref 15–41)
Albumin: 3.8 g/dL (ref 3.5–5.0)
Alkaline Phosphatase: 68 U/L (ref 38–126)
Anion gap: 12 (ref 5–15)
BUN: 13 mg/dL (ref 8–23)
CO2: 24 mmol/L (ref 22–32)
Calcium: 9.5 mg/dL (ref 8.9–10.3)
Chloride: 108 mmol/L (ref 98–111)
Creatinine: 1.21 mg/dL (ref 0.61–1.24)
GFR, Est AFR Am: >60 mL/min (ref >60)
GFR, Estimated: >60 mL/min (ref >60)
Glucose, Bld: 118 mg/dL — ABNORMAL HIGH (ref 70–99)
Potassium: 3.6 mmol/L (ref 3.5–5.1)
Sodium: 144 mmol/L (ref 135–145)
Total Bilirubin: 0.4 mg/dL (ref 0.3–1.2)
Total Protein: 7.5 g/dL (ref 6.5–8.1)

## 2018-07-12 LAB — CBC WITH DIFFERENTIAL (CANCER CENTER ONLY)
Abs Immature Granulocytes: 0 10*3/uL (ref 0.00–0.07)
Basophils Absolute: 0.1 10*3/uL (ref 0.0–0.1)
Basophils Relative: 1 %
Eosinophils Absolute: 0.1 10*3/uL (ref 0.0–0.5)
Eosinophils Relative: 1 %
HCT: 41.3 % (ref 39.0–52.0)
Hemoglobin: 13.5 g/dL (ref 13.0–17.0)
Immature Granulocytes: 0 %
Lymphocytes Relative: 38 %
Lymphs Abs: 2.4 10*3/uL (ref 0.7–4.0)
MCH: 32.5 pg (ref 26.0–34.0)
MCHC: 32.7 g/dL (ref 30.0–36.0)
MCV: 99.5 fL (ref 80.0–100.0)
Monocytes Absolute: 0.7 10*3/uL (ref 0.1–1.0)
Monocytes Relative: 11 %
Neutro Abs: 3 10*3/uL (ref 1.7–7.7)
Neutrophils Relative %: 49 %
Platelet Count: 242 10*3/uL (ref 150–400)
RBC: 4.15 MIL/uL — ABNORMAL LOW (ref 4.22–5.81)
RDW: 14.3 % (ref 11.5–15.5)
WBC Count: 6.2 10*3/uL (ref 4.0–10.5)
nRBC: 0 % (ref 0.0–0.2)

## 2018-07-12 MED ORDER — GABAPENTIN 100 MG PO CAPS: 100.0000 mg | ORAL_CAPSULE | Freq: Three times a day (TID) | ORAL | 1 refills | Status: DC

## 2018-07-12 MED ORDER — PROCHLORPERAZINE MALEATE 10 MG PO TABS: 10.0000 mg | ORAL_TABLET | Freq: Once | ORAL | Status: DC

## 2018-07-12 MED ORDER — BORTEZOMIB CHEMO SQ INJECTION 3.5 MG (2.5MG/ML)
1.3000 mg/m2 | Freq: Once | INTRAMUSCULAR | Status: AC
  Administered 2018-07-12: 3.25 mg via SUBCUTANEOUS
  Filled 2018-07-12: qty 1.3

## 2018-07-12 NOTE — Progress Notes (Signed)
[facility] [phone]   [phone]  OFFICE PROGRESS NOTE  Derinda Late, MD [phone] [facility] [place] [address]  DIAGNOSIS: Multiple myeloma, IgG subtype diagnosed in January 2020.  PRIOR THERAPY: None  CURRENT THERAPY: Systemic chemotherapy with Velcade 1.3 mg/M2 on days 1, 8, 15 as well as Revlimid 25 mg p.o. daily for 21 days every 4 weeks as well as Decadron 40 mg weekly.  First dose expected March 22, 2018.  Status post 4 cycles.  INTERVAL HISTORY: Matthew Yates 68 y.o. male returns to the clinic today for follow-up visit.  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  He does not take any medication for the neuropathy.  He continues to have anxiety and currently on Xanax.  He denied having chest pain, shortness of breath, cough or hemoptysis.  He denied having any fever or chills.  He has no nausea, vomiting, diarrhea or constipation.  He denied having any headache or visual changes.  He is tolerating his treatment with Velcade, Revlimid and Decadron fairly well except for the neuropathy.  He had repeat myeloma panel performed recently and he is here for evaluation and discussion of his lab results before starting cycle #5.  MEDICAL HISTORY: Past Medical History:  Diagnosis Date  . Acute medial meniscus tear of left knee   . Arthritis    lt knee  . GERD (gastroesophageal reflux disease)   . Hypertension     ALLERGIES:  is allergic to other.  MEDICATIONS:  Current Outpatient Medications  Medication Sig Dispense Refill  . acyclovir (ZOVIRAX) 200 MG capsule TAKE 1 CAPSULE BY MOUTH TWICE A DAY 60 capsule 2  . ALPRAZolam (XANAX) 0.25 MG tablet Take 1 tablet (0.25 mg total) by mouth at bedtime as needed for anxiety. 30 tablet 0  . amLODipine (NORVASC) 5 MG tablet Take by mouth.    .  dexamethasone (DECADRON) 4 MG tablet 10 tab po once weekly with chemo 80 tablet 2  . esomeprazole (NEXIUM) 20 MG capsule Take 1 capsule (20 mg total) by mouth daily before breakfast. 30 capsule 1  . HYDROcodone-acetaminophen (NORCO) 7.5-325 MG tablet Take 1-2 tablets by mouth every 6 (six) hours as needed for moderate pain. (Patient not taking: Reported on 04/05/2018) 90 tablet 0  . ibuprofen (ADVIL,MOTRIN) 400 MG tablet Take 400 mg by mouth every 6 (six) hours as needed.    Marland Kitchen lenalidomide (REVLIMID) 25 MG capsule TAKE 1 CAPSULE BY MOUTH DAILY FOR 21 DAYS THEN TAKE 7 DAYS OFF 21 capsule 0  . loratadine-pseudoephedrine (CLARITIN-D 12-HOUR) 5-120 MG tablet Take 1 tablet by mouth 2 (two) times daily.    Marland Kitchen LORazepam (ATIVAN) 0.5 MG tablet Take by mouth.    . ondansetron (ZOFRAN) 4 MG tablet Take 1-2 tablets (4-8 mg total) by mouth every 8 (eight) hours as needed for nausea or vomiting. (Patient not taking: Reported on 04/05/2018) 40 tablet 0  . prochlorperazine (COMPAZINE) 10 MG tablet Take 1 tablet (10 mg total) by mouth every 6 (six) hours as needed for nausea or vomiting. (Patient not taking: Reported on 04/05/2018) 30 tablet 0  . senna-docusate (SENOKOT S) 8.6-50 MG tablet Take 1 tablet by mouth at bedtime as needed. 30 tablet 1  . warfarin (COUMADIN) 2 MG tablet Take 1 tablet (2 mg total) by mouth daily. 90 tablet 1   No  current facility-administered medications for this visit.     SURGICAL HISTORY:  Past Surgical History:  Procedure Laterality Date  . CHONDROPLASTY Left 07/09/2016   Procedure: CHONDROPLASTY with MICROFRACTURE;  Surgeon: Leandrew Koyanagi, MD;  Location: [facility];  Service: Orthopedics;  Laterality: Left;  . COLONOSCOPY     x2  . HIP SURGERY     aspiration only, no anesthesia  . KNEE ARTHROSCOPY WITH MEDIAL MENISECTOMY Left 07/09/2016   Procedure: LEFT KNEE ARTHROSCOPY WITH PARTIAL MEDIAL MENISCECTOMY;  Surgeon: Leandrew Koyanagi, MD;  Location: [facility];   Service: Orthopedics;  Laterality: Left;    REVIEW OF SYSTEMS:  Constitutional: positive for fatigue Eyes: negative Ears, nose, mouth, throat, and face: negative Respiratory: negative Cardiovascular: negative Gastrointestinal: negative Genitourinary:negative Integument/breast: negative Hematologic/lymphatic: negative Musculoskeletal:negative Neurological: positive for paresthesia Behavioral/Psych: negative Endocrine: negative Allergic/Immunologic: negative   PHYSICAL EXAMINATION: General appearance: alert, cooperative and no distress Head: Normocephalic, without obvious abnormality, atraumatic Neck: no adenopathy, no JVD, supple, symmetrical, trachea midline and thyroid not enlarged, symmetric, no tenderness/mass/nodules Lymph nodes: Cervical, supraclavicular, and axillary nodes normal. Resp: clear to auscultation bilaterally Back: symmetric, no curvature. ROM normal. No CVA tenderness. Cardio: regular rate and rhythm, S1, S2 normal, no murmur, click, rub or gallop GI: soft, non-tender; bowel sounds normal; no masses,  no organomegaly Extremities: extremities normal, atraumatic, no cyanosis or edema Neurologic: Alert and oriented X 3, normal strength and tone. Normal symmetric reflexes. Normal coordination and gait  ECOG PERFORMANCE STATUS: 1 - Symptomatic but completely ambulatory  Blood pressure 139/69, pulse 92, temperature 98.3 F (36.8 C), temperature source Oral, resp. rate 18, height _0  (1.905 m), weight 263 lb 4.8 oz (119.4 kg), SpO2 98 %.  LABORATORY DATA: Lab Results  Component Value Date   WBC 6.2 07/12/2018   HGB 13.5 07/12/2018   HCT 41.3 07/12/2018   MCV 99.5 07/12/2018   PLT 242 07/12/2018      Chemistry      Component Value Date/Time   NA 144 07/12/2018 1429   K 3.6 07/12/2018 1429   CL 108 07/12/2018 1429   CO2 24 07/12/2018 1429   BUN 13 07/12/2018 1429   CREATININE 1.21 07/12/2018 1429      Component Value Date/Time   CALCIUM 9.5 07/12/2018  1429   ALKPHOS 68 07/12/2018 1429   AST 13 (L) 07/12/2018 1429   ALT 17 07/12/2018 1429   BILITOT 0.4 07/12/2018 1429       RADIOGRAPHIC STUDIES: No results found.  ASSESSMENT AND PLAN: This is a very pleasant 68 years old African-American male recently diagnosed with multiple myeloma, IgG subtype.  He is currently undergoing treatment with Revlimid, Velcade and Decadron status post 4 cycles. The patient continues to tolerate this treatment well with no concerning adverse effect except for mild peripheral neuropathy. He has repeat myeloma panel performed recently.  I personally discussed the lab results with the patient and his myeloma panel showed continuous improvement of his disease. I recommended for the patient to have repeat bone marrow biopsy and aspirate to evaluate the bone marrow status before referring him to [facility] for stem cell transplant. I will also recommended for the patient to continue his current treatment with Revlimid, Velcade and Decadron until evaluation for the transplant. The peripheral neuropathy I will start him on gabapentin 100 mg p.o. 3 times daily and will consider increasing his needed. For anxiety we will continue his current treatment with Xanax. He will come back for  follow-up visit in 4 weeks for evaluation before the next cycle of his treatment. He was advised to call immediately if he has any concerning symptoms in the interval. The patient voices understanding of current disease status and treatment options and is in agreement with the current care plan.  All questions were answered. The patient knows to call the clinic with any problems, questions or concerns. We can certainly see the patient much sooner if necessary.  I spent 10 minutes counseling the patient face to face. The total time spent in the appointment was 15 minutes.  Disclaimer: This note was dictated with voice recognition software. Similar sounding words can inadvertently be  transcribed and may not be corrected upon review.

## 2018-07-12 NOTE — Patient Instructions (Signed)
[facility] Discharge Instructions for Patients Receiving Chemotherapy  Today you received the following chemotherapy agents Velcade  To help prevent nausea and vomiting after your treatment, we encourage you to take your nausea medication: As directed by MD   If you develop nausea and vomiting that is not controlled by your nausea medication, call the clinic.   BELOW ARE SYMPTOMS THAT SHOULD BE REPORTED IMMEDIATELY:  *FEVER GREATER THAN 100.5 F  *CHILLS WITH OR WITHOUT FEVER  NAUSEA AND VOMITING THAT IS NOT CONTROLLED WITH YOUR NAUSEA MEDICATION  *UNUSUAL SHORTNESS OF BREATH  *UNUSUAL BRUISING OR BLEEDING  TENDERNESS IN MOUTH AND THROAT WITH OR WITHOUT PRESENCE OF ULCERS  *URINARY PROBLEMS  *BOWEL PROBLEMS  UNUSUAL RASH Items with * indicate a potential emergency and should be followed up as soon as possible.  Feel free to call the clinic should you have any questions or concerns. The clinic phone number is [phone].  Please show the CHEMO ALERT CARD at check-in to the Emergency Department and triage nurse.   

## 2018-07-13 ENCOUNTER — Telehealth: Payer: Self-pay | Admitting: Internal Medicine

## 2018-07-13 NOTE — Telephone Encounter (Signed)
Added additional cycles per 6/08 los - pt to get an updated schedule next visit.

## 2018-07-14 ENCOUNTER — Telehealth: Payer: Self-pay | Admitting: Internal Medicine

## 2018-07-14 NOTE — Telephone Encounter (Signed)
FAXED RECORDS TO [facility].

## 2018-07-19 ENCOUNTER — Inpatient Hospital Stay: Payer: BC Managed Care – PPO

## 2018-07-19 ENCOUNTER — Other Ambulatory Visit: Payer: Self-pay

## 2018-07-19 ENCOUNTER — Other Ambulatory Visit: Payer: Self-pay | Admitting: Physician Assistant

## 2018-07-19 VITALS — BP 141/66 | HR 81 | Temp 98.6°F | Resp 18

## 2018-07-19 DIAGNOSIS — C9000 Multiple myeloma not having achieved remission: Secondary | ICD-10-CM | POA: Diagnosis not present

## 2018-07-19 LAB — CMP (CANCER CENTER ONLY)
ALT: 17 U/L (ref 0–44)
AST: 10 U/L — ABNORMAL LOW (ref 15–41)
Albumin: 3.4 g/dL — ABNORMAL LOW (ref 3.5–5.0)
Alkaline Phosphatase: 74 U/L (ref 38–126)
Anion gap: 10 (ref 5–15)
BUN: 8 mg/dL (ref 8–23)
CO2: 27 mmol/L (ref 22–32)
Calcium: 8.8 mg/dL — ABNORMAL LOW (ref 8.9–10.3)
Chloride: 104 mmol/L (ref 98–111)
Creatinine: 1.04 mg/dL (ref 0.61–1.24)
GFR, Est AFR Am: >60 mL/min (ref >60)
GFR, Estimated: >60 mL/min (ref >60)
Glucose, Bld: 102 mg/dL — ABNORMAL HIGH (ref 70–99)
Potassium: 3.3 mmol/L — ABNORMAL LOW (ref 3.5–5.1)
Sodium: 141 mmol/L (ref 135–145)
Total Bilirubin: 0.3 mg/dL (ref 0.3–1.2)
Total Protein: 6.8 g/dL (ref 6.5–8.1)

## 2018-07-19 LAB — CBC WITH DIFFERENTIAL (CANCER CENTER ONLY)
Abs Immature Granulocytes: 0.02 10*3/uL (ref 0.00–0.07)
Basophils Absolute: 0 10*3/uL (ref 0.0–0.1)
Basophils Relative: 1 %
Eosinophils Absolute: 0.3 10*3/uL (ref 0.0–0.5)
Eosinophils Relative: 5 %
HCT: 39.8 % (ref 39.0–52.0)
Hemoglobin: 13.3 g/dL (ref 13.0–17.0)
Immature Granulocytes: 0 %
Lymphocytes Relative: 40 %
Lymphs Abs: 2.3 10*3/uL (ref 0.7–4.0)
MCH: 32.5 pg (ref 26.0–34.0)
MCHC: 33.4 g/dL (ref 30.0–36.0)
MCV: 97.3 fL (ref 80.0–100.0)
Monocytes Absolute: 0.5 10*3/uL (ref 0.1–1.0)
Monocytes Relative: 9 %
Neutro Abs: 2.5 10*3/uL (ref 1.7–7.7)
Neutrophils Relative %: 45 %
Platelet Count: 158 10*3/uL (ref 150–400)
RBC: 4.09 MIL/uL — ABNORMAL LOW (ref 4.22–5.81)
RDW: 14.2 % (ref 11.5–15.5)
WBC Count: 5.6 10*3/uL (ref 4.0–10.5)
nRBC: 0 % (ref 0.0–0.2)

## 2018-07-19 MED ORDER — PROCHLORPERAZINE MALEATE 10 MG PO TABS: 10.0000 mg | ORAL_TABLET | Freq: Once | ORAL | Status: DC

## 2018-07-19 MED ORDER — BORTEZOMIB CHEMO SQ INJECTION 3.5 MG (2.5MG/ML)
1.3000 mg/m2 | Freq: Once | INTRAMUSCULAR | Status: AC
  Administered 2018-07-19: 3.25 mg via SUBCUTANEOUS
  Filled 2018-07-19: qty 1.3

## 2018-07-19 NOTE — Patient Instructions (Signed)
Coronavirus (COVID-19) Are you at risk?  Are you at risk for the Coronavirus (COVID-19)?  To be considered HIGH RISK for Coronavirus (COVID-19), you have to meet the following criteria:  . Traveled to [place], [place], [place], [place] or [place]; or in the [place] to [place], [place], [place], or [place]; and have fever, cough, and shortness of breath within the last 2 weeks of travel OR . Been in close contact with a person diagnosed with COVID-19 within the last 2 weeks and have fever, cough, and shortness of breath . IF YOU DO NOT MEET THESE CRITERIA, YOU ARE CONSIDERED LOW RISK FOR COVID-19.  What to do if you are HIGH RISK for COVID-19?  . If you are having a medical emergency, call 911. . Seek medical care right away. Before you go to a doctor's office, urgent care or emergency department, call ahead and tell them about your recent travel, contact with someone diagnosed with COVID-19, and your symptoms. You should receive instructions from your physician's office regarding next steps of care.  . When you arrive at healthcare provider, tell the healthcare staff immediately you have returned from visiting [place], [place], [place], [place] or [place]; or traveled in the [place] to [place], [place], [place], or [place]; in the last two weeks or you have been in close contact with a person diagnosed with COVID-19 in the last 2 weeks.   . Tell the health care staff about your symptoms: fever, cough and shortness of breath. . After you have been seen by a medical provider, you will be either: o Tested for (COVID-19) and discharged home on quarantine except to seek medical care if symptoms worsen, and asked to  - Stay home and avoid contact with others until you get your results (4-5 days)  - Avoid travel on public transportation if possible (such as bus, train, or airplane) or o Sent to the Emergency Department by EMS for evaluation, COVID-19 testing, and possible  admission depending on your condition and test results.  What to do if you are LOW RISK for COVID-19?  Reduce your risk of any infection by using the same precautions used for avoiding the common cold or flu:  . Wash your hands often with soap and warm water for at least 20 seconds.  If soap and water are not readily available, use an alcohol-based hand sanitizer with at least 60% alcohol.  . If coughing or sneezing, cover your mouth and nose by coughing or sneezing into the elbow areas of your shirt or coat, into a tissue or into your sleeve (not your hands). . Avoid shaking hands with others and consider head nods or verbal greetings only. . Avoid touching your eyes, nose, or mouth with unwashed hands.  . Avoid close contact with people who are sick. . Avoid places or events with large numbers of people in one location, like concerts or sporting events. . Carefully consider travel plans you have or are making. . If you are planning any travel outside or inside the [place], visit the CDC's Travelers' Health webpage for the latest health notices. . If you have some symptoms but not all symptoms, continue to monitor at home and seek medical attention if your symptoms worsen. . If you are having a medical emergency, call 911.   ADDITIONAL HEALTHCARE OPTIONS FOR PATIENTS  [facility] / e-Visit: [email or web address]         [facility]: [phone]  [facility]: [phone]                   [facility]: [phone]    [facility] Discharge Instructions for Patients Receiving Chemotherapy  Today you received the following chemotherapy agents Velcade  To help prevent nausea and vomiting after your treatment, we encourage you to take your nausea medication as directed   If you develop nausea and vomiting that is not controlled by your nausea medication, call the clinic.   BELOW ARE  SYMPTOMS THAT SHOULD BE REPORTED IMMEDIATELY:  *FEVER GREATER THAN 100.5 F  *CHILLS WITH OR WITHOUT FEVER  NAUSEA AND VOMITING THAT IS NOT CONTROLLED WITH YOUR NAUSEA MEDICATION  *UNUSUAL SHORTNESS OF BREATH  *UNUSUAL BRUISING OR BLEEDING  TENDERNESS IN MOUTH AND THROAT WITH OR WITHOUT PRESENCE OF ULCERS  *URINARY PROBLEMS  *BOWEL PROBLEMS  UNUSUAL RASH Items with * indicate a potential emergency and should be followed up as soon as possible.  Feel free to call the clinic should you have any questions or concerns. The clinic phone number is [phone].  Please show the CHEMO ALERT CARD at check-in to the Emergency Department and triage nurse.   

## 2018-07-20 ENCOUNTER — Ambulatory Visit (HOSPITAL_COMMUNITY)
Admission: RE | Admit: 2018-07-20 | Discharge: 2018-07-20 | Disposition: A | Discharge status: Home / Self Care | Payer: BC Managed Care – PPO | Source: Ambulatory Visit | Attending: Internal Medicine | Admitting: Internal Medicine

## 2018-07-20 ENCOUNTER — Encounter (HOSPITAL_COMMUNITY): Payer: Self-pay

## 2018-07-20 DIAGNOSIS — K219 Gastro-esophageal reflux disease without esophagitis: Secondary | ICD-10-CM | POA: Insufficient documentation

## 2018-07-20 DIAGNOSIS — Z8041 Family history of malignant neoplasm of ovary: Secondary | ICD-10-CM | POA: Insufficient documentation

## 2018-07-20 DIAGNOSIS — I10 Essential (primary) hypertension: Secondary | ICD-10-CM | POA: Diagnosis not present

## 2018-07-20 DIAGNOSIS — Z79899 Other long term (current) drug therapy: Secondary | ICD-10-CM | POA: Diagnosis not present

## 2018-07-20 DIAGNOSIS — Z7901 Long term (current) use of anticoagulants: Secondary | ICD-10-CM | POA: Diagnosis not present

## 2018-07-20 DIAGNOSIS — Z809 Family history of malignant neoplasm, unspecified: Secondary | ICD-10-CM | POA: Insufficient documentation

## 2018-07-20 DIAGNOSIS — C9000 Multiple myeloma not having achieved remission: Secondary | ICD-10-CM | POA: Diagnosis present

## 2018-07-20 LAB — PROTIME-INR
INR: 1 (ref 0.8–1.2)
Prothrombin Time: 13.2 seconds (ref 11.4–15.2)

## 2018-07-20 LAB — CBC WITH DIFFERENTIAL/PLATELET
Abs Immature Granulocytes: 0.03 10*3/uL (ref 0.00–0.07)
Basophils Absolute: 0 10*3/uL (ref 0.0–0.1)
Basophils Relative: 1 %
Eosinophils Absolute: 0.3 10*3/uL (ref 0.0–0.5)
Eosinophils Relative: 5 %
HCT: 40 % (ref 39.0–52.0)
Hemoglobin: 13.5 g/dL (ref 13.0–17.0)
Immature Granulocytes: 1 %
Lymphocytes Relative: 27 %
Lymphs Abs: 1.6 10*3/uL (ref 0.7–4.0)
MCH: 33.3 pg (ref 26.0–34.0)
MCHC: 33.8 g/dL (ref 30.0–36.0)
MCV: 98.5 fL (ref 80.0–100.0)
Monocytes Absolute: 0.5 10*3/uL (ref 0.1–1.0)
Monocytes Relative: 8 %
Neutro Abs: 3.5 10*3/uL (ref 1.7–7.7)
Neutrophils Relative %: 58 %
Platelets: 159 10*3/uL (ref 150–400)
RBC: 4.06 MIL/uL — ABNORMAL LOW (ref 4.22–5.81)
RDW: 14.3 % (ref 11.5–15.5)
WBC: 5.8 10*3/uL (ref 4.0–10.5)
nRBC: 0 % (ref 0.0–0.2)

## 2018-07-20 LAB — APTT: aPTT: 27 seconds (ref 24–36)

## 2018-07-20 MED ORDER — MIDAZOLAM HCL 2 MG/2ML IJ SOLN
INTRAMUSCULAR | Status: AC | PRN
  Administered 2018-07-20 (×2): 1 mg via INTRAVENOUS

## 2018-07-20 MED ORDER — SODIUM CHLORIDE 0.9 % IV SOLN
INTRAVENOUS | Status: DC
  Administered 2018-07-20: 08:00:00 via INTRAVENOUS

## 2018-07-20 MED ORDER — FENTANYL CITRATE (PF) 100 MCG/2ML IJ SOLN
INTRAMUSCULAR | Status: AC
  Filled 2018-07-20: qty 2

## 2018-07-20 MED ORDER — LIDOCAINE HCL (PF) 1 % IJ SOLN
INTRAMUSCULAR | Status: AC | PRN
  Administered 2018-07-20: 10 mL

## 2018-07-20 MED ORDER — MIDAZOLAM HCL 2 MG/2ML IJ SOLN
INTRAMUSCULAR | Status: AC
  Filled 2018-07-20: qty 4

## 2018-07-20 MED ORDER — FENTANYL CITRATE (PF) 100 MCG/2ML IJ SOLN
INTRAMUSCULAR | Status: AC | PRN
  Administered 2018-07-20 (×2): 50 ug via INTRAVENOUS

## 2018-07-20 NOTE — Discharge Instructions (Signed)
Bone Marrow Aspiration and Bone Marrow Biopsy, Adult, Care After °This sheet gives you information about how to care for yourself after your procedure. Your health care provider may also give you more specific instructions. If you have problems or questions, contact your health care provider. °What can I expect after the procedure? °After the procedure, it is common to have: °· Mild pain and tenderness. °· Swelling. °· Bruising. °Follow these instructions at home: °Puncture site care ° °  ° °· Follow instructions from your health care provider about how to take care of the puncture site. Make sure you: °? Wash your hands with soap and water before you change your bandage (dressing). If soap and water are not available, use hand sanitizer. °? Change your dressing as told by your health care provider. °· Check your puncture site every day for signs of infection. Check for: °? More redness, swelling, or pain. °? More fluid or blood. °? Warmth. °? Pus or a bad smell. °General instructions °· Take over-the-counter and prescription medicines only as told by your health care provider. °· Do not take baths, swim, or use a hot tub until your health care provider approves. Ask if you can take a shower or have a sponge bath. °· Return to your normal activities as told by your health care provider. Ask your health care provider what activities are safe for you. °· Do not drive for 24 hours if you were given a medicine to help you relax (sedative) during your procedure. °· Keep all follow-up visits as told by your health care provider. This is important. °Contact a health care provider if: °· Your pain is not controlled with medicine. °Get help right away if: °· You have a fever. °· You have more redness, swelling, or pain around the puncture site. °· You have more fluid or blood coming from the puncture site. °· Your puncture site feels warm to the touch. °· You have pus or a bad smell coming from the puncture site. °These  symptoms may represent a serious problem that is an emergency. Do not wait to see if the symptoms will go away. Get medical help right away. Call your local emergency services (911 in the [place] Do not drive yourself to the hospital. °Summary °· After the procedure, it is common to have mild pain, tenderness, swelling, and bruising. °· Follow instructions from your health care provider about how to take care of the puncture site. °· Get help right away if you have any symptoms of infection or if you have more blood or fluid coming from the puncture site. °This information is not intended to replace advice given to you by your health care provider. Make sure you discuss any questions you have with your health care provider. °Document Released: 08/09/2004 Document Revised: 05/05/2017 Document Reviewed: 07/04/2015 °Elsevier Interactive Patient Education © 2019 [employer] ° ° ° °Moderate Conscious Sedation, Adult, Care After °These instructions provide you with information about caring for yourself after your procedure. Your health care provider may also give you more specific instructions. Your treatment has been planned according to current medical practices, but problems sometimes occur. Call your health care provider if you have any problems or questions after your procedure. °What can I expect after the procedure? °After your procedure, it is common: °· To feel sleepy for several hours. °· To feel clumsy and have poor balance for several hours. °· To have poor judgment for several hours. °· To vomit if you eat too soon. °  Follow these instructions at home: For at least 24 hours after the procedure:   Do not: ? Participate in activities where you could fall or become injured. ? Drive. ? Use heavy machinery. ? Drink alcohol. ? Take sleeping pills or medicines that cause drowsiness. ? Make important decisions or sign legal documents. ? Take care of children on your own.  Rest. Eating and  drinking  Follow the diet recommended by your health care provider.  If you vomit: ? Drink water, juice, or soup when you can drink without vomiting. ? Make sure you have little or no nausea before eating solid foods. General instructions  Have a responsible adult stay with you until you are awake and alert.  Take over-the-counter and prescription medicines only as told by your health care provider.  If you smoke, do not smoke without supervision.  Keep all follow-up visits as told by your health care provider. This is important. Contact a health care provider if:  You keep feeling nauseous or you keep vomiting.  You feel light-headed.  You develop a rash.  You have a fever. Get help right away if:  You have trouble breathing. This information is not intended to replace advice given to you by your health care provider. Make sure you discuss any questions you have with your health care provider. Document Released: 11/10/2012 Document Revised: 06/25/2015 Document Reviewed: 05/12/2015 Elsevier Interactive Patient Education  2019 [employer].

## 2018-07-20 NOTE — Procedures (Signed)
Interventional Radiology Procedure Note  Procedure: CT guided bone marrow aspiration and biopsy  Complications: None  EBL: < 10 mL  Findings: Aspirate and core biopsy performed of bone marrow in right iliac bone.  Plan: Bedrest supine x 1 hrs  Hinton Luellen T. Malala Trenkamp, M.D Pager:  [phone]   

## 2018-07-20 NOTE — Consult Note (Signed)
Chief Complaint: Patient was seen in consultation today for CT guided bone marrow biopsy  Referring Physician(s): Mohamed,Mohamed  Supervising Physician: Aletta Edouard  Patient Status: [facility] - Out-pt  History of Present Illness: Matthew Yates is a 68 y.o. male with history of multiple myeloma diagnosed in January of this year who presents today for CT-guided bone marrow biopsy prior to consideration of stem cell transplant.  Past Medical History:  Diagnosis Date  . Acute medial meniscus tear of left knee   . Arthritis    lt knee  . GERD (gastroesophageal reflux disease)   . Hypertension     Past Surgical History:  Procedure Laterality Date  . CHONDROPLASTY Left 07/09/2016   Procedure: CHONDROPLASTY with MICROFRACTURE;  Surgeon: Leandrew Koyanagi, MD;  Location: [place];  Service: Orthopedics;  Laterality: Left;  . COLONOSCOPY     x2  . HIP SURGERY     aspiration only, no anesthesia  . KNEE ARTHROSCOPY WITH MEDIAL MENISECTOMY Left 07/09/2016   Procedure: LEFT KNEE ARTHROSCOPY WITH PARTIAL MEDIAL MENISCECTOMY;  Surgeon: Leandrew Koyanagi, MD;  Location: [place];  Service: Orthopedics;  Laterality: Left;    Allergies: Other  Medications: Prior to Admission medications   Medication Sig Start Date End Date Taking? Authorizing Provider  acyclovir (ZOVIRAX) 200 MG capsule TAKE 1 CAPSULE BY MOUTH TWICE A DAY 06/15/18  Yes Curt Bears, MD  ALPRAZolam Duanne Moron) 0.25 MG tablet Take 1 tablet (0.25 mg total) by mouth at bedtime as needed for anxiety. 05/20/18  Yes Heilingoetter, Cassandra L, PA-C  amLODipine (NORVASC) 5 MG tablet Take by mouth. 05/06/16 07/20/18 Yes [provider]  dexamethasone (DECADRON) 4 MG tablet 10 tab po once weekly with chemo 03/16/18  Yes Curt Bears, MD  esomeprazole (NEXIUM) 20 MG capsule Take 1 capsule (20 mg total) by mouth daily before breakfast. 08/14/11 07/20/18 Yes Autumn Messing III, MD  gabapentin (NEURONTIN)  100 MG capsule Take 1 capsule (100 mg total) by mouth 3 (three) times daily. 07/12/18  Yes Curt Bears, MD  lenalidomide (REVLIMID) 25 MG capsule TAKE 1 CAPSULE BY MOUTH DAILY FOR 21 DAYS THEN TAKE 7 DAYS OFF 07/08/18  Yes Curt Bears, MD  loratadine-pseudoephedrine (CLARITIN-D 12-HOUR) 5-120 MG tablet Take 1 tablet by mouth 2 (two) times daily.   Yes [provider]  prochlorperazine (COMPAZINE) 10 MG tablet Take 1 tablet (10 mg total) by mouth every 6 (six) hours as needed for nausea or vomiting. 03/22/18  Yes Curt Bears, MD  senna-docusate (SENOKOT S) 8.6-50 MG tablet Take 1 tablet by mouth at bedtime as needed. 07/09/16  Yes Leandrew Koyanagi, MD  warfarin (COUMADIN) 2 MG tablet Take 1 tablet (2 mg total) by mouth daily. 03/16/18  Yes Curt Bears, MD  HYDROcodone-acetaminophen (NORCO) 7.5-325 MG tablet Take 1-2 tablets by mouth every 6 (six) hours as needed for moderate pain. Patient not taking: Reported on 04/05/2018 07/09/16   Leandrew Koyanagi, MD  ibuprofen (ADVIL,MOTRIN) 400 MG tablet Take 400 mg by mouth every 6 (six) hours as needed.    [provider]  LORazepam (ATIVAN) 0.5 MG tablet Take by mouth.    [provider]  ondansetron (ZOFRAN) 4 MG tablet Take 1-2 tablets (4-8 mg total) by mouth every 8 (eight) hours as needed for nausea or vomiting. Patient not taking: Reported on 04/05/2018 07/09/16   Leandrew Koyanagi, MD     Family History  Problem Relation Age of Onset  . Cancer Mother   .  Cancer Sister        ovarian    Social History   Socioeconomic History  . Marital status: Married    Spouse name: Not on file  . Number of children: Not on file  . Years of education: Not on file  . Highest education level: Not on file  Occupational History  . Not on file  Social Needs  . Financial resource strain: Not on file  . Food insecurity    Worry: Not on file    Inability: Not on file  . Transportation needs    Medical: Not on file    Non-medical: Not  on file  Tobacco Use  . Smoking status: Never Smoker  . Smokeless tobacco: Never Used  Substance and Sexual Activity  . Alcohol use: No  . Drug use: No  . Sexual activity: Not Currently  Lifestyle  . Physical activity    Days per week: Not on file    Minutes per session: Not on file  . Stress: Not on file  Relationships  . Social Herbalist on phone: Not on file    Gets together: Not on file    Attends religious service: Not on file    Active member of club or organization: Not on file    Attends meetings of clubs or organizations: Not on file    Relationship status: Not on file  Other Topics Concern  . Not on file  Social History Narrative  . Not on file      Review of Systems denies fever, headache, chest pain, dyspnea, cough, abdominal pain, back pain, nausea, vomiting or bleeding.  He does have peripheral neuropathy.  Vital Signs: BP (!) 142/86 (BP Location: Right Arm)   Pulse 77   Temp 98.1 F (36.7 C) (Oral)   Resp 18   SpO2 97%   Physical Exam awake, alert.  Chest clear to auscultation bilaterally.  Heart with regular rate and rhythm.  Abdomen soft, positive bowel sounds, nontender.  No lower extremity edema.  Imaging: No results found.  Labs:  CBC: Recent Labs    06/29/18 1423 07/12/18 1429 07/19/18 1409 07/20/18 0800  WBC 6.4 6.2 5.6 5.8  HGB 13.7 13.5 13.3 13.5  HCT 40.5 41.3 39.8 40.0  PLT 177 242 158 159    COAGS: No results for input(s): INR, APTT in the last 8760 hours.  BMP: Recent Labs    06/21/18 1422 06/29/18 1423 07/12/18 1429 07/19/18 1409  NA 140 140 144 141  K 3.7 3.6 3.6 3.3*  CL 104 104 108 104  CO2 _0 GLUCOSE 98 116* 118* 102*  BUN _1 CALCIUM 9.1 9.0 9.5 8.8*  CREATININE 1.09 1.01 1.21 1.04  GFRNONAA >60 >60 >60 >60  GFRAA >60 >60 >60 >60    LIVER FUNCTION TESTS: Recent Labs    06/21/18 1422 06/29/18 1423 07/12/18 1429 07/19/18 1409  BILITOT 0.3 0.4 0.4 0.3  AST 12* 14* 13*  10*  ALT _2 ALKPHOS 78 68 68 74  PROT 7.1 7.1 7.5 6.8  ALBUMIN 3.5 3.6 3.8 3.4*    TUMOR MARKERS: No results for input(s): AFPTM, CEA, CA199, CHROMGRNA in the last 8760 hours.  Assessment and Plan:  68 y.o. male with history of multiple myeloma diagnosed in January of this year who presents today for CT-guided bone marrow biopsy prior to consideration of stem cell transplant.Risks and benefits of procedure was discussed  with the patient  including, but not limited to bleeding, infection, damage to adjacent structures or low yield requiring additional tests.  All of the questions were answered and there is agreement to proceed.  Consent signed and in chart.     Thank you for this interesting consult.  I greatly enjoyed meeting SHEMAR PLEMMONS and look forward to participating in their care.  A copy of this report was sent to the requesting provider on this date.  Electronically Signed: D. Rowe Robert, PA-C 07/20/2018, 8:28 AM   I spent a total of 20 minutes    in face to face in clinical consultation, greater than 50% of which was counseling/coordinating care for CT-guided bone marrow biopsy

## 2018-07-26 ENCOUNTER — Inpatient Hospital Stay: Payer: BC Managed Care – PPO

## 2018-07-26 ENCOUNTER — Other Ambulatory Visit: Payer: Self-pay

## 2018-07-26 VITALS — BP 140/76 | HR 94 | Temp 98.2°F | Resp 20

## 2018-07-26 DIAGNOSIS — C9000 Multiple myeloma not having achieved remission: Secondary | ICD-10-CM | POA: Diagnosis not present

## 2018-07-26 LAB — CBC WITH DIFFERENTIAL (CANCER CENTER ONLY)
Abs Immature Granulocytes: 0.01 10*3/uL (ref 0.00–0.07)
Basophils Absolute: 0 10*3/uL (ref 0.0–0.1)
Basophils Relative: 0 %
Eosinophils Absolute: 0.3 10*3/uL (ref 0.0–0.5)
Eosinophils Relative: 5 %
HCT: 40.9 % (ref 39.0–52.0)
Hemoglobin: 13.6 g/dL (ref 13.0–17.0)
Immature Granulocytes: 0 %
Lymphocytes Relative: 37 %
Lymphs Abs: 2.1 10*3/uL (ref 0.7–4.0)
MCH: 32.9 pg (ref 26.0–34.0)
MCHC: 33.3 g/dL (ref 30.0–36.0)
MCV: 99 fL (ref 80.0–100.0)
Monocytes Absolute: 0.7 10*3/uL (ref 0.1–1.0)
Monocytes Relative: 12 %
Neutro Abs: 2.6 10*3/uL (ref 1.7–7.7)
Neutrophils Relative %: 46 %
Platelet Count: 136 10*3/uL — ABNORMAL LOW (ref 150–400)
RBC: 4.13 MIL/uL — ABNORMAL LOW (ref 4.22–5.81)
RDW: 14 % (ref 11.5–15.5)
WBC Count: 5.8 10*3/uL (ref 4.0–10.5)
nRBC: 0 % (ref 0.0–0.2)

## 2018-07-26 LAB — CMP (CANCER CENTER ONLY)
ALT: 23 U/L (ref 0–44)
AST: 13 U/L — ABNORMAL LOW (ref 15–41)
Albumin: 3.4 g/dL — ABNORMAL LOW (ref 3.5–5.0)
Alkaline Phosphatase: 72 U/L (ref 38–126)
Anion gap: 11 (ref 5–15)
BUN: 12 mg/dL (ref 8–23)
CO2: 26 mmol/L (ref 22–32)
Calcium: 9.1 mg/dL (ref 8.9–10.3)
Chloride: 106 mmol/L (ref 98–111)
Creatinine: 1.17 mg/dL (ref 0.61–1.24)
GFR, Est AFR Am: >60 mL/min (ref >60)
GFR, Estimated: >60 mL/min (ref >60)
Glucose, Bld: 99 mg/dL (ref 70–99)
Potassium: 3.4 mmol/L — ABNORMAL LOW (ref 3.5–5.1)
Sodium: 143 mmol/L (ref 135–145)
Total Bilirubin: 0.4 mg/dL (ref 0.3–1.2)
Total Protein: 6.8 g/dL (ref 6.5–8.1)

## 2018-07-26 MED ORDER — PROCHLORPERAZINE MALEATE 10 MG PO TABS: 10.0000 mg | ORAL_TABLET | Freq: Once | ORAL | Status: DC

## 2018-07-26 MED ORDER — BORTEZOMIB CHEMO SQ INJECTION 3.5 MG (2.5MG/ML)
1.3000 mg/m2 | Freq: Once | INTRAMUSCULAR | Status: AC
  Administered 2018-07-26: 3.25 mg via SUBCUTANEOUS
  Filled 2018-07-26: qty 1.3

## 2018-07-26 MED ORDER — PROCHLORPERAZINE MALEATE 10 MG PO TABS
ORAL_TABLET | ORAL | Status: AC
  Filled 2018-07-26: qty 1

## 2018-07-26 NOTE — Patient Instructions (Signed)
[facility] Discharge Instructions for Patients Receiving Chemotherapy  Today you received the following chemotherapy agents: Velcade   To help prevent nausea and vomiting after your treatment, we encourage you to take your nausea medication as directed.    If you develop nausea and vomiting that is not controlled by your nausea medication, call the clinic.   BELOW ARE SYMPTOMS THAT SHOULD BE REPORTED IMMEDIATELY:  *FEVER GREATER THAN 100.5 F  *CHILLS WITH OR WITHOUT FEVER  NAUSEA AND VOMITING THAT IS NOT CONTROLLED WITH YOUR NAUSEA MEDICATION  *UNUSUAL SHORTNESS OF BREATH  *UNUSUAL BRUISING OR BLEEDING  TENDERNESS IN MOUTH AND THROAT WITH OR WITHOUT PRESENCE OF ULCERS  *URINARY PROBLEMS  *BOWEL PROBLEMS  UNUSUAL RASH Items with * indicate a potential emergency and should be followed up as soon as possible.  Feel free to call the clinic should you have any questions or concerns. The clinic phone number is [phone].  Please show the [facility] at check-in to the Emergency Department and triage nurse.   Coronavirus (COVID-19) Are you at risk?  Are you at risk for the Coronavirus (COVID-19)?  To be considered HIGH RISK for Coronavirus (COVID-19), you have to meet the following criteria:  . Traveled to [place], [place], [place], [place] or [place]; or in the [place] to [place], [place], [place], or [place]; and have fever, cough, and shortness of breath within the last 2 weeks of travel OR . Been in close contact with a person diagnosed with COVID-19 within the last 2 weeks and have fever, cough, and shortness of breath . IF YOU DO NOT MEET THESE CRITERIA, YOU ARE CONSIDERED LOW RISK FOR COVID-19.  What to do if you are HIGH RISK for COVID-19?  Marland Kitchen If you are having a medical emergency, call 911. . Seek medical care right away. Before you go to a doctor's office, urgent care or emergency department, call ahead and tell them about your  recent travel, contact with someone diagnosed with COVID-19, and your symptoms. You should receive instructions from your physician's office regarding next steps of care.  . When you arrive at healthcare provider, tell the healthcare staff immediately you have returned from visiting [place], [place], [place], [place] or [place]; or traveled in the [place] to [place], [place], [place], or [place]; in the last two weeks or you have been in close contact with a person diagnosed with COVID-19 in the last 2 weeks.   . Tell the health care staff about your symptoms: fever, cough and shortness of breath. . After you have been seen by a medical provider, you will be either: o Tested for (COVID-19) and discharged home on quarantine except to seek medical care if symptoms worsen, and asked to  - Stay home and avoid contact with others until you get your results (4-5 days)  - Avoid travel on public transportation if possible (such as bus, train, or airplane) or o Sent to the Emergency Department by EMS for evaluation, COVID-19 testing, and possible admission depending on your condition and test results.  What to do if you are LOW RISK for COVID-19?  Reduce your risk of any infection by using the same precautions used for avoiding the common cold or flu:  Marland Kitchen Wash your hands often with soap and warm water for at least 20 seconds.  If soap and water are not readily available, use an alcohol-based hand sanitizer with at least 60% alcohol.  . If  coughing or sneezing, cover your mouth and nose by coughing or sneezing into the elbow areas of your shirt or coat, into a tissue or into your sleeve (not your hands). . Avoid shaking hands with others and consider head nods or verbal greetings only. . Avoid touching your eyes, nose, or mouth with unwashed hands.  . Avoid close contact with people who are sick. . Avoid places or events with large numbers of people in one location, like concerts or sporting  events. . Carefully consider travel plans you have or are making. . If you are planning any travel outside or inside the [place], visit the CDC's Travelers' Health webpage for the latest health notices. . If you have some symptoms but not all symptoms, continue to monitor at home and seek medical attention if your symptoms worsen. . If you are having a medical emergency, call 911.   [place] / e-Visit: [email or web address]         [facility]: [facility]: [phone]                   [facility]: [phone]

## 2018-07-29 ENCOUNTER — Telehealth: Payer: Self-pay | Admitting: Medical Oncology

## 2018-07-29 NOTE — Telephone Encounter (Signed)
Confirmed July appts. with pt.

## 2018-07-30 ENCOUNTER — Other Ambulatory Visit: Payer: Self-pay | Admitting: Internal Medicine

## 2018-07-30 DIAGNOSIS — C9000 Multiple myeloma not having achieved remission: Secondary | ICD-10-CM

## 2018-08-02 ENCOUNTER — Other Ambulatory Visit: Payer: BC Managed Care – PPO

## 2018-08-02 ENCOUNTER — Ambulatory Visit: Payer: BC Managed Care – PPO

## 2018-08-09 ENCOUNTER — Other Ambulatory Visit: Payer: Self-pay

## 2018-08-09 ENCOUNTER — Inpatient Hospital Stay (HOSPITAL_BASED_OUTPATIENT_CLINIC_OR_DEPARTMENT_OTHER): Payer: BC Managed Care – PPO | Admitting: Physician Assistant

## 2018-08-09 ENCOUNTER — Inpatient Hospital Stay: Payer: BC Managed Care – PPO

## 2018-08-09 ENCOUNTER — Inpatient Hospital Stay: Payer: BC Managed Care – PPO | Attending: Internal Medicine

## 2018-08-09 VITALS — BP 127/76 | HR 77 | Temp 98.3°F | Resp 16 | Ht 75.0 in | Wt 264.0 lb

## 2018-08-09 DIAGNOSIS — Z79899 Other long term (current) drug therapy: Secondary | ICD-10-CM

## 2018-08-09 DIAGNOSIS — C9000 Multiple myeloma not having achieved remission: Secondary | ICD-10-CM

## 2018-08-09 DIAGNOSIS — G620 Drug-induced polyneuropathy: Secondary | ICD-10-CM | POA: Diagnosis not present

## 2018-08-09 DIAGNOSIS — Z5112 Encounter for antineoplastic immunotherapy: Secondary | ICD-10-CM | POA: Diagnosis present

## 2018-08-09 DIAGNOSIS — F419 Anxiety disorder, unspecified: Secondary | ICD-10-CM | POA: Diagnosis not present

## 2018-08-09 DIAGNOSIS — Z5111 Encounter for antineoplastic chemotherapy: Secondary | ICD-10-CM

## 2018-08-09 LAB — CBC WITH DIFFERENTIAL (CANCER CENTER ONLY)
Abs Immature Granulocytes: 0.01 10*3/uL (ref 0.00–0.07)
Basophils Absolute: 0.1 10*3/uL (ref 0.0–0.1)
Basophils Relative: 1 %
Eosinophils Absolute: 0.1 10*3/uL (ref 0.0–0.5)
Eosinophils Relative: 1 %
HCT: 38.3 % — ABNORMAL LOW (ref 39.0–52.0)
Hemoglobin: 13 g/dL (ref 13.0–17.0)
Immature Granulocytes: 0 %
Lymphocytes Relative: 44 %
Lymphs Abs: 2.5 10*3/uL (ref 0.7–4.0)
MCH: 32.7 pg (ref 26.0–34.0)
MCHC: 33.9 g/dL (ref 30.0–36.0)
MCV: 96.2 fL (ref 80.0–100.0)
Monocytes Absolute: 0.8 10*3/uL (ref 0.1–1.0)
Monocytes Relative: 13 %
Neutro Abs: 2.4 10*3/uL (ref 1.7–7.7)
Neutrophils Relative %: 41 %
Platelet Count: 224 10*3/uL (ref 150–400)
RBC: 3.98 MIL/uL — ABNORMAL LOW (ref 4.22–5.81)
RDW: 14.3 % (ref 11.5–15.5)
WBC Count: 5.7 10*3/uL (ref 4.0–10.5)
nRBC: 0 % (ref 0.0–0.2)

## 2018-08-09 LAB — CMP (CANCER CENTER ONLY)
ALT: 16 U/L (ref 0–44)
AST: 11 U/L — ABNORMAL LOW (ref 15–41)
Albumin: 3.4 g/dL — ABNORMAL LOW (ref 3.5–5.0)
Alkaline Phosphatase: 72 U/L (ref 38–126)
Anion gap: 11 (ref 5–15)
BUN: 12 mg/dL (ref 8–23)
CO2: 26 mmol/L (ref 22–32)
Calcium: 8.8 mg/dL — ABNORMAL LOW (ref 8.9–10.3)
Chloride: 106 mmol/L (ref 98–111)
Creatinine: 1.05 mg/dL (ref 0.61–1.24)
GFR, Est AFR Am: >60 mL/min (ref >60)
GFR, Estimated: >60 mL/min (ref >60)
Glucose, Bld: 113 mg/dL — ABNORMAL HIGH (ref 70–99)
Potassium: 3.6 mmol/L (ref 3.5–5.1)
Sodium: 143 mmol/L (ref 135–145)
Total Bilirubin: 0.3 mg/dL (ref 0.3–1.2)
Total Protein: 7.2 g/dL (ref 6.5–8.1)

## 2018-08-09 MED ORDER — BORTEZOMIB CHEMO SQ INJECTION 3.5 MG (2.5MG/ML)
1.3000 mg/m2 | Freq: Once | INTRAMUSCULAR | Status: AC
  Administered 2018-08-09: 17:00:00 3.25 mg via SUBCUTANEOUS
  Filled 2018-08-09: qty 1.3

## 2018-08-09 NOTE — Patient Instructions (Signed)
[facility] Discharge Instructions for Patients Receiving Chemotherapy  Today you received the following chemotherapy agents: Velcade   To help prevent nausea and vomiting after your treatment, we encourage you to take your nausea medication as directed.    If you develop nausea and vomiting that is not controlled by your nausea medication, call the clinic.   BELOW ARE SYMPTOMS THAT SHOULD BE REPORTED IMMEDIATELY:  *FEVER GREATER THAN 100.5 F  *CHILLS WITH OR WITHOUT FEVER  NAUSEA AND VOMITING THAT IS NOT CONTROLLED WITH YOUR NAUSEA MEDICATION  *UNUSUAL SHORTNESS OF BREATH  *UNUSUAL BRUISING OR BLEEDING  TENDERNESS IN MOUTH AND THROAT WITH OR WITHOUT PRESENCE OF ULCERS  *URINARY PROBLEMS  *BOWEL PROBLEMS  UNUSUAL RASH Items with * indicate a potential emergency and should be followed up as soon as possible.  Feel free to call the clinic should you have any questions or concerns. The clinic phone number is [phone].  Please show the [facility] at check-in to the Emergency Department and triage nurse.   Coronavirus (COVID-19) Are you at risk?  Are you at risk for the Coronavirus (COVID-19)?  To be considered HIGH RISK for Coronavirus (COVID-19), you have to meet the following criteria:  . Traveled to [place], [place], [place], [place] or [place]; or in the [place] to [place], [place], [place], or [place]; and have fever, cough, and shortness of breath within the last 2 weeks of travel OR . Been in close contact with a person diagnosed with COVID-19 within the last 2 weeks and have fever, cough, and shortness of breath . IF YOU DO NOT MEET THESE CRITERIA, YOU ARE CONSIDERED LOW RISK FOR COVID-19.  What to do if you are HIGH RISK for COVID-19?  Marland Kitchen If you are having a medical emergency, call 911. . Seek medical care right away. Before you go to a doctor's office, urgent care or emergency department, call ahead and tell them about your  recent travel, contact with someone diagnosed with COVID-19, and your symptoms. You should receive instructions from your physician's office regarding next steps of care.  . When you arrive at healthcare provider, tell the healthcare staff immediately you have returned from visiting [place], [place], [place], [place] or [place]; or traveled in the [place] to [place], [place], [place], or [place]; in the last two weeks or you have been in close contact with a person diagnosed with COVID-19 in the last 2 weeks.   . Tell the health care staff about your symptoms: fever, cough and shortness of breath. . After you have been seen by a medical provider, you will be either: o Tested for (COVID-19) and discharged home on quarantine except to seek medical care if symptoms worsen, and asked to  - Stay home and avoid contact with others until you get your results (4-5 days)  - Avoid travel on public transportation if possible (such as bus, train, or airplane) or o Sent to the Emergency Department by EMS for evaluation, COVID-19 testing, and possible admission depending on your condition and test results.  What to do if you are LOW RISK for COVID-19?  Reduce your risk of any infection by using the same precautions used for avoiding the common cold or flu:  Marland Kitchen Wash your hands often with soap and warm water for at least 20 seconds.  If soap and water are not readily available, use an alcohol-based hand sanitizer with at least 60% alcohol.  . If  coughing or sneezing, cover your mouth and nose by coughing or sneezing into the elbow areas of your shirt or coat, into a tissue or into your sleeve (not your hands). . Avoid shaking hands with others and consider head nods or verbal greetings only. . Avoid touching your eyes, nose, or mouth with unwashed hands.  . Avoid close contact with people who are sick. . Avoid places or events with large numbers of people in one location, like concerts or sporting  events. . Carefully consider travel plans you have or are making. . If you are planning any travel outside or inside the [place], visit the CDC's Travelers' Health webpage for the latest health notices. . If you have some symptoms but not all symptoms, continue to monitor at home and seek medical attention if your symptoms worsen. . If you are having a medical emergency, call 911.   [place] / e-Visit: [email or web address]         [facility]: [facility]: [phone]                   [facility]: [phone]

## 2018-08-09 NOTE — Progress Notes (Signed)
Pt states he took his own compazine

## 2018-08-09 NOTE — Progress Notes (Signed)
[place] OFFICE PROGRESS NOTE  Matthew Late, MD [phone] [address] [facility] [place] [address]  DIAGNOSIS: Multiple myeloma, IgG subtype diagnosed in January 2020.  PRIOR THERAPY: None  CURRENT THERAPY: Systemic chemotherapy with Velcade 1.3 mg/M2 on days 1, 8, 15 as well as Revlimid 25 mg p.o. daily for 21 days every 4 weeks as well as Decadron 40 mg weekly. First dose expected March 22, 2018.  Status post 5 cycles.  INTERVAL HISTORY: Matthew Yates 68 y.o. male returns to the clinic for a follow-up visit.  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 The patient was recently started on gabapentin 100 mg 3 times daily. Thus far, he has not seen significant improvement in his peripheral neuropathy.  Otherwise he is tolerating treatment well without any other adverse effects. He denies any chest pain, shortness of breath, cough, or hemoptysis.  He denies any fever, chills, night sweats, fatigue, lymphadenopathy, or weight loss.  He denies any nausea, vomiting, diarrhea, or constipation.  He denies any recent illness or infections.  He denies any rashes or skin changes.  He continues to take his prophylactic acyclovir and Coumadin without any adverse effects.  He denies any bleeding or bruising including hematuria, melena, hematochezia, or epistaxis.  The patient recently had bone marrow biopsy performed.  The patient is here today for evaluation and to review his bone marrow biopsy results before proceeding with cycle #6 of his treatment.   MEDICAL HISTORY: Past Medical History:  Diagnosis Date  . Acute medial meniscus tear of left knee   . Arthritis    lt knee  . GERD (gastroesophageal reflux disease)   . Hypertension     ALLERGIES:  is allergic to other.  MEDICATIONS:  Current  Outpatient Medications  Medication Sig Dispense Refill  . acyclovir (ZOVIRAX) 200 MG capsule TAKE 1 CAPSULE BY MOUTH TWICE A DAY 60 capsule 2  . ALPRAZolam (XANAX) 0.25 MG tablet Take 1 tablet (0.25 mg total) by mouth at bedtime as needed for anxiety. 30 tablet 0  . dexamethasone (DECADRON) 4 MG tablet 10 tab po once weekly with chemo 80 tablet 2  . gabapentin (NEURONTIN) 100 MG capsule Take 1 capsule (100 mg total) by mouth 3 (three) times daily. 90 capsule 1  . HYDROcodone-acetaminophen (NORCO) 7.5-325 MG tablet Take 1-2 tablets by mouth every 6 (six) hours as needed for moderate pain. 90 tablet 0  . ibuprofen (ADVIL,MOTRIN) 400 MG tablet Take 400 mg by mouth every 6 (six) hours as needed.    . loratadine-pseudoephedrine (CLARITIN-D 12-HOUR) 5-120 MG tablet Take 1 tablet by mouth 2 (two) times daily.    Marland Kitchen LORazepam (ATIVAN) 0.5 MG tablet Take by mouth.    . ondansetron (ZOFRAN) 4 MG tablet Take 1-2 tablets (4-8 mg total) by mouth every 8 (eight) hours as needed for nausea or vomiting. 40 tablet 0  . prochlorperazine (COMPAZINE) 10 MG tablet Take 1 tablet (10 mg total) by mouth every 6 (six) hours as needed for nausea or vomiting. 30 tablet 0  . senna-docusate (SENOKOT S) 8.6-50 MG tablet Take 1 tablet by mouth at bedtime as needed. 30 tablet 1  . warfarin (COUMADIN) 2 MG tablet Take 1 tablet (2 mg total) by mouth daily. 90 tablet 1  . amLODipine (NORVASC) 5 MG tablet Take by mouth.    . esomeprazole (  NEXIUM) 20 MG capsule Take 1 capsule (20 mg total) by mouth daily before breakfast. 30 capsule 1  . lenalidomide (REVLIMID) 25 MG capsule TAKE 1 CAPSULE BY MOUTH EVERY DAY FOR 21 DAYS , THEN OFF FOR 7 DAYS (Patient not taking: Reported on 08/09/2018) 21 capsule 0   No current facility-administered medications for this visit.     SURGICAL HISTORY:  Past Surgical History:  Procedure Laterality Date  . CHONDROPLASTY Left 07/09/2016   Procedure: CHONDROPLASTY with MICROFRACTURE;  Surgeon: Leandrew Koyanagi, MD;  Location: [facility];  Service: Orthopedics;  Laterality: Left;  . COLONOSCOPY     x2  . HIP SURGERY     aspiration only, no anesthesia  . KNEE ARTHROSCOPY WITH MEDIAL MENISECTOMY Left 07/09/2016   Procedure: LEFT KNEE ARTHROSCOPY WITH PARTIAL MEDIAL MENISCECTOMY;  Surgeon: Leandrew Koyanagi, MD;  Location: [facility];  Service: Orthopedics;  Laterality: Left;    REVIEW OF SYSTEMS:   Review of Systems  Constitutional: Negative for appetite change, chills, fatigue, fever and unexpected weight change.  HENT: Negative for mouth sores, nosebleeds, sore throat and trouble swallowing.   Eyes: Negative for eye problems and icterus.  Respiratory: Negative for cough, hemoptysis, shortness of breath and wheezing.   Cardiovascular: Negative for chest pain and leg swelling.  Gastrointestinal: Negative for abdominal pain, constipation, diarrhea, nausea and vomiting.  Genitourinary: Negative for bladder incontinence, difficulty urinating, dysuria, frequency and hematuria.   Musculoskeletal: Negative for back pain, gait problem, neck pain and neck stiffness.  Skin: Negative for itching and rash.  Neurological: Positive for bilateral peripheral neuropathy in his lower extremities. Negative for dizziness, extremity weakness, gait problem, headaches, light-headedness and seizures.  Hematological: Negative for adenopathy. Does not bruise/bleed easily.  Psychiatric/Behavioral: Negative for confusion, depression and sleep disturbance. The patient is not nervous/anxious.     PHYSICAL EXAMINATION:  Blood pressure 127/76, pulse 77, temperature 98.3 F (36.8 C), temperature source Temporal, resp. rate 16, height 6' 3"  (1.905 m), weight 264 lb (119.7 kg), SpO2 100 %.  ECOG PERFORMANCE STATUS: 1 - Symptomatic but completely ambulatory  Physical Exam  Constitutional: Oriented to person, place, and time and well-developed, well-nourished, and in no distress.  HENT:  Head:  Normocephalic and atraumatic.  Mouth/Throat: Oropharynx is clear and moist. No oropharyngeal exudate.  Eyes: Conjunctivae are normal. Right eye exhibits no discharge. Left eye exhibits no discharge. No scleral icterus.  Neck: Normal range of motion. Neck supple.  Cardiovascular: Normal rate, regular rhythm, normal heart sounds and intact distal pulses.   Pulmonary/Chest: Effort normal and breath sounds normal. No respiratory distress. No wheezes. No rales.  Abdominal: Soft. Bowel sounds are normal. Exhibits no distension and no mass. There is no tenderness.  Musculoskeletal: Normal range of motion. Exhibits no edema.  Lymphadenopathy:    No cervical adenopathy.  Neurological: Alert and oriented to person, place, and time. Exhibits normal muscle tone. Gait normal. Coordination normal.  Skin: Skin is warm and dry. No rash noted. Not diaphoretic. No erythema. No pallor.  Psychiatric: Mood, memory and judgment normal.  Vitals reviewed.  LABORATORY DATA: Lab Results  Component Value Date   WBC 5.7 08/09/2018   HGB 13.0 08/09/2018   HCT 38.3 (L) 08/09/2018   MCV 96.2 08/09/2018   PLT 224 08/09/2018      Chemistry      Component Value Date/Time   NA 143 08/09/2018 1407   K 3.6 08/09/2018 1407   CL 106 08/09/2018 1407   CO2  26 08/09/2018 1407   BUN 12 08/09/2018 1407   CREATININE 1.05 08/09/2018 1407      Component Value Date/Time   CALCIUM 8.8 (L) 08/09/2018 1407   ALKPHOS 72 08/09/2018 1407   AST 11 (L) 08/09/2018 1407   ALT 16 08/09/2018 1407   BILITOT 0.3 08/09/2018 1407       RADIOGRAPHIC STUDIES:  Ct Biopsy  Result Date: 07/20/2018 CLINICAL DATA:  History of multiple myeloma not having achieved remission. Bone marrow biopsy required to establish status of disease. EXAM: CT GUIDED BONE MARROW ASPIRATION AND BIOPSY ANESTHESIA/SEDATION: Versed 2.0 mg IV, Fentanyl 100 mcg IV Total Moderate Sedation Time:   13 minutes. The patient's level of consciousness and physiologic  status were continuously monitored during the procedure by Radiology nursing. PROCEDURE: The procedure risks, benefits, and alternatives were explained to the patient. Questions regarding the procedure were encouraged and answered. The patient understands and consents to the procedure. A time out was performed prior to initiating the procedure. The right gluteal region was prepped with chlorhexidine. Sterile gown and sterile gloves were used for the procedure. Local anesthesia was provided with 1% Lidocaine. Under CT guidance, an 11 gauge On Control bone cutting needle was advanced from a posterior approach into the right iliac bone. Needle positioning was confirmed with CT. Initial non heparinized and heparinized aspirate samples were obtained of bone marrow. Core biopsy was performed via the On Control drill needle. COMPLICATIONS: None FINDINGS: Inspection of initial aspirate did reveal visible particles. Intact core biopsy sample was obtained. IMPRESSION: CT guided bone marrow biopsy of right posterior iliac bone with both aspirate and core samples obtained. Electronically Signed   By: Aletta Edouard M.D.   On: 07/20/2018 11:49   Ct Bone Marrow Biopsy & Aspiration  Result Date: 07/20/2018 CLINICAL DATA:  History of multiple myeloma not having achieved remission. Bone marrow biopsy required to establish status of disease. EXAM: CT GUIDED BONE MARROW ASPIRATION AND BIOPSY ANESTHESIA/SEDATION: Versed 2.0 mg IV, Fentanyl 100 mcg IV Total Moderate Sedation Time:   13 minutes. The patient's level of consciousness and physiologic status were continuously monitored during the procedure by Radiology nursing. PROCEDURE: The procedure risks, benefits, and alternatives were explained to the patient. Questions regarding the procedure were encouraged and answered. The patient understands and consents to the procedure. A time out was performed prior to initiating the procedure. The right gluteal region was prepped with  chlorhexidine. Sterile gown and sterile gloves were used for the procedure. Local anesthesia was provided with 1% Lidocaine. Under CT guidance, an 11 gauge On Control bone cutting needle was advanced from a posterior approach into the right iliac bone. Needle positioning was confirmed with CT. Initial non heparinized and heparinized aspirate samples were obtained of bone marrow. Core biopsy was performed via the On Control drill needle. COMPLICATIONS: None FINDINGS: Inspection of initial aspirate did reveal visible particles. Intact core biopsy sample was obtained. IMPRESSION: CT guided bone marrow biopsy of right posterior iliac bone with both aspirate and core samples obtained. Electronically Signed   By: Aletta Edouard M.D.   On: 07/20/2018 11:49     ASSESSMENT/PLAN:  This is a very pleasant 68 year old American male who was recently diagnosed with multiple myeloma, IgG subtype.  He was diagnosed in January 2020. He is currently undergoing treatment with Velcade 1.3 mg/m on days 1, 8, and 15 every 4 weeks in addition to Revlimid 25 mg p.o. daily for 21 days every 4 weeks.  He also takes 40 mg  of Decadron p.o. weekly.  He is status post 5 cycles. He had been tolerating treatment well except for developing peripheral neuropathy. He is currently on gabapentin.  The patient had a bone marrow biopsy performed recently.  Dr. Julien Nordmann personally and independently reviewed the results and discussed the results with the patient today.  The biopsy results showed continued improvement in his disease.  Dr. Julien Nordmann recommends that the patient proceed with his last planned cycle of treatment today, cycle #6.   A referral was placed to [facility] for consideration of a stem cell transplant. The patient has not heard from their office at this time. We will reach out to determine the status of this referral.   I will see the patient back for follow-up visit in 4 weeks for evaluation and repeat blood.   The  patient was advised to call immediately if he has any concerning symptoms in the interval. The patient voices understanding of current disease status and treatment options and is in agreement with the current care plan. All questions were answered. The patient knows to call the clinic with any problems, questions or concerns. We can certainly see the patient much sooner if necessary  No orders of the defined types were placed in this encounter.    Cassandra L Heilingoetter, PA-C 08/09/18  ADDENDUM: Hematology/Oncology Attending: I had a face-to-face encounter with the patient.  I recommended his care plan.  This is a very pleasant 68 years old African-American male with multiple myeloma, IgG subtype diagnosed in January 2020 and currently undergoing treatment with Velcade, Revlimid and Decadron status post 5 cycles.  The patient has been tolerating this treatment well with no concerning adverse effects.  He has good response on the protein studies.  The patient also had repeat bone marrow biopsy and aspirate performed recently that showed significant decrease in the plasma cells to 6%. I recommended for the patient to proceed with cycle #6 today. He was referred to the stem cell transplant team at [facility] but unfortunately there was delay in the referral process and we will work on getting the patient seen at [facility] soon before giving him any further treatment after cycle #6. The patient will come back for follow-up visit in 1 months for evaluation and repeat blood work. For anxiety he will continue on Xanax. He was advised to call immediately if he has any concerning symptoms in the interval.  Disclaimer: This note was dictated with voice recognition software. Similar sounding words can inadvertently be transcribed and may be missed upon review. Eilleen Kempf, MD 08/10/18

## 2018-08-10 ENCOUNTER — Encounter: Payer: Self-pay | Admitting: Physician Assistant

## 2018-08-10 ENCOUNTER — Other Ambulatory Visit: Payer: Self-pay | Admitting: Medical Oncology

## 2018-08-10 DIAGNOSIS — C9000 Multiple myeloma not having achieved remission: Secondary | ICD-10-CM

## 2018-08-10 MED ORDER — LENALIDOMIDE 25 MG PO CAPS: ORAL_CAPSULE | ORAL | 0 refills | Status: DC

## 2018-08-10 NOTE — Addendum Note (Signed)
Addended by: Ardeen Garland on: 08/10/2018 08:52 AM   Modules accepted: Orders

## 2018-08-12 ENCOUNTER — Telehealth: Payer: Self-pay | Admitting: Internal Medicine

## 2018-08-12 ENCOUNTER — Telehealth: Payer: Self-pay | Admitting: Medical Oncology

## 2018-08-12 NOTE — Telephone Encounter (Signed)
LVM to department to return call to confirm referral received. Unable to reach clinic

## 2018-08-12 NOTE — Telephone Encounter (Signed)
Pt appt with [facility] is 08/25/18@1 :15.  Pt is aware.

## 2018-08-16 ENCOUNTER — Other Ambulatory Visit: Payer: Self-pay

## 2018-08-16 ENCOUNTER — Inpatient Hospital Stay: Payer: BC Managed Care – PPO

## 2018-08-16 VITALS — BP 118/72 | HR 76 | Temp 99.1°F | Resp 16

## 2018-08-16 DIAGNOSIS — C9000 Multiple myeloma not having achieved remission: Secondary | ICD-10-CM | POA: Diagnosis not present

## 2018-08-16 LAB — CMP (CANCER CENTER ONLY)
ALT: 18 U/L (ref 0–44)
AST: 12 U/L — ABNORMAL LOW (ref 15–41)
Albumin: 3.4 g/dL — ABNORMAL LOW (ref 3.5–5.0)
Alkaline Phosphatase: 67 U/L (ref 38–126)
Anion gap: 10 (ref 5–15)
BUN: 10 mg/dL (ref 8–23)
CO2: 27 mmol/L (ref 22–32)
Calcium: 8.8 mg/dL — ABNORMAL LOW (ref 8.9–10.3)
Chloride: 104 mmol/L (ref 98–111)
Creatinine: 0.97 mg/dL (ref 0.61–1.24)
GFR, Est AFR Am: >60 mL/min (ref >60)
GFR, Estimated: >60 mL/min (ref >60)
Glucose, Bld: 95 mg/dL (ref 70–99)
Potassium: 3.5 mmol/L (ref 3.5–5.1)
Sodium: 141 mmol/L (ref 135–145)
Total Bilirubin: 0.3 mg/dL (ref 0.3–1.2)
Total Protein: 7 g/dL (ref 6.5–8.1)

## 2018-08-16 LAB — CBC WITH DIFFERENTIAL (CANCER CENTER ONLY)
Abs Immature Granulocytes: 0.03 10*3/uL (ref 0.00–0.07)
Basophils Absolute: 0 10*3/uL (ref 0.0–0.1)
Basophils Relative: 1 %
Eosinophils Absolute: 0.2 10*3/uL (ref 0.0–0.5)
Eosinophils Relative: 3 %
HCT: 39.3 % (ref 39.0–52.0)
Hemoglobin: 13.2 g/dL (ref 13.0–17.0)
Immature Granulocytes: 1 %
Lymphocytes Relative: 41 %
Lymphs Abs: 2.5 10*3/uL (ref 0.7–4.0)
MCH: 32.8 pg (ref 26.0–34.0)
MCHC: 33.6 g/dL (ref 30.0–36.0)
MCV: 97.8 fL (ref 80.0–100.0)
Monocytes Absolute: 0.7 10*3/uL (ref 0.1–1.0)
Monocytes Relative: 11 %
Neutro Abs: 2.6 10*3/uL (ref 1.7–7.7)
Neutrophils Relative %: 43 %
Platelet Count: 171 10*3/uL (ref 150–400)
RBC: 4.02 MIL/uL — ABNORMAL LOW (ref 4.22–5.81)
RDW: 14.8 % (ref 11.5–15.5)
WBC Count: 6 10*3/uL (ref 4.0–10.5)
nRBC: 0 % (ref 0.0–0.2)

## 2018-08-16 MED ORDER — PROCHLORPERAZINE MALEATE 10 MG PO TABS: 10.0000 mg | ORAL_TABLET | Freq: Once | ORAL | Status: DC

## 2018-08-16 MED ORDER — BORTEZOMIB CHEMO SQ INJECTION 3.5 MG (2.5MG/ML)
1.3000 mg/m2 | Freq: Once | INTRAMUSCULAR | Status: AC
  Administered 2018-08-16: 3.25 mg via SUBCUTANEOUS
  Filled 2018-08-16: qty 1.3

## 2018-08-16 NOTE — Patient Instructions (Signed)
[facility] Discharge Instructions for Patients Receiving Chemotherapy  Today you received the following chemotherapy agents Velcade To help prevent nausea and vomiting after your treatment, we encourage you to take your nausea medication as prescribed.   If you develop nausea and vomiting that is not controlled by your nausea medication, call the clinic.   BELOW ARE SYMPTOMS THAT SHOULD BE REPORTED IMMEDIATELY:  *FEVER GREATER THAN 100.5 F  *CHILLS WITH OR WITHOUT FEVER  NAUSEA AND VOMITING THAT IS NOT CONTROLLED WITH YOUR NAUSEA MEDICATION  *UNUSUAL SHORTNESS OF BREATH  *UNUSUAL BRUISING OR BLEEDING  TENDERNESS IN MOUTH AND THROAT WITH OR WITHOUT PRESENCE OF ULCERS  *URINARY PROBLEMS  *BOWEL PROBLEMS  UNUSUAL RASH Items with * indicate a potential emergency and should be followed up as soon as possible.  Feel free to call the clinic should you have any questions or concerns. The clinic phone number is [phone].  Please show the CHEMO ALERT CARD at check-in to the Emergency Department and triage nurse.   

## 2018-08-23 ENCOUNTER — Other Ambulatory Visit: Payer: Self-pay

## 2018-08-23 ENCOUNTER — Inpatient Hospital Stay: Payer: BC Managed Care – PPO

## 2018-08-23 VITALS — BP 148/69 | HR 82 | Temp 98.8°F | Resp 17 | Ht 75.0 in | Wt 257.0 lb

## 2018-08-23 DIAGNOSIS — C9000 Multiple myeloma not having achieved remission: Secondary | ICD-10-CM

## 2018-08-23 LAB — CBC WITH DIFFERENTIAL (CANCER CENTER ONLY)
Abs Immature Granulocytes: 0.03 10*3/uL (ref 0.00–0.07)
Basophils Absolute: 0 10*3/uL (ref 0.0–0.1)
Basophils Relative: 0 %
Eosinophils Absolute: 0.2 10*3/uL (ref 0.0–0.5)
Eosinophils Relative: 3 %
HCT: 38.7 % — ABNORMAL LOW (ref 39.0–52.0)
Hemoglobin: 13 g/dL (ref 13.0–17.0)
Immature Granulocytes: 0 %
Lymphocytes Relative: 35 %
Lymphs Abs: 2.6 10*3/uL (ref 0.7–4.0)
MCH: 32.6 pg (ref 26.0–34.0)
MCHC: 33.6 g/dL (ref 30.0–36.0)
MCV: 97 fL (ref 80.0–100.0)
Monocytes Absolute: 1 10*3/uL (ref 0.1–1.0)
Monocytes Relative: 13 %
Neutro Abs: 3.7 10*3/uL (ref 1.7–7.7)
Neutrophils Relative %: 49 %
Platelet Count: 150 10*3/uL (ref 150–400)
RBC: 3.99 MIL/uL — ABNORMAL LOW (ref 4.22–5.81)
RDW: 14.6 % (ref 11.5–15.5)
WBC Count: 7.5 10*3/uL (ref 4.0–10.5)
nRBC: 0 % (ref 0.0–0.2)

## 2018-08-23 LAB — CMP (CANCER CENTER ONLY)
ALT: 22 U/L (ref 0–44)
AST: 10 U/L — ABNORMAL LOW (ref 15–41)
Albumin: 3.3 g/dL — ABNORMAL LOW (ref 3.5–5.0)
Alkaline Phosphatase: 60 U/L (ref 38–126)
Anion gap: 10 (ref 5–15)
BUN: 9 mg/dL (ref 8–23)
CO2: 27 mmol/L (ref 22–32)
Calcium: 8.7 mg/dL — ABNORMAL LOW (ref 8.9–10.3)
Chloride: 105 mmol/L (ref 98–111)
Creatinine: 1.13 mg/dL (ref 0.61–1.24)
GFR, Est AFR Am: >60 mL/min (ref >60)
GFR, Estimated: >60 mL/min (ref >60)
Glucose, Bld: 117 mg/dL — ABNORMAL HIGH (ref 70–99)
Potassium: 3.3 mmol/L — ABNORMAL LOW (ref 3.5–5.1)
Sodium: 142 mmol/L (ref 135–145)
Total Bilirubin: 0.5 mg/dL (ref 0.3–1.2)
Total Protein: 6.5 g/dL (ref 6.5–8.1)

## 2018-08-23 MED ORDER — PROCHLORPERAZINE MALEATE 10 MG PO TABS: 10.0000 mg | ORAL_TABLET | Freq: Once | ORAL | Status: DC

## 2018-08-23 MED ORDER — BORTEZOMIB CHEMO SQ INJECTION 3.5 MG (2.5MG/ML)
1.3000 mg/m2 | Freq: Once | INTRAMUSCULAR | Status: AC
  Administered 2018-08-23: 3.25 mg via SUBCUTANEOUS
  Filled 2018-08-23: qty 1.3

## 2018-08-23 NOTE — Patient Instructions (Signed)
[facility] Discharge Instructions for Patients Receiving Chemotherapy  Today you received the following chemotherapy agents :  Velcade.  To help prevent nausea and vomiting after your treatment, we encourage you to take your nausea medication as prescribed.   If you develop nausea and vomiting that is not controlled by your nausea medication, call the clinic.   BELOW ARE SYMPTOMS THAT SHOULD BE REPORTED IMMEDIATELY:  *FEVER GREATER THAN 100.5 F  *CHILLS WITH OR WITHOUT FEVER  NAUSEA AND VOMITING THAT IS NOT CONTROLLED WITH YOUR NAUSEA MEDICATION  *UNUSUAL SHORTNESS OF BREATH  *UNUSUAL BRUISING OR BLEEDING  TENDERNESS IN MOUTH AND THROAT WITH OR WITHOUT PRESENCE OF ULCERS  *URINARY PROBLEMS  *BOWEL PROBLEMS  UNUSUAL RASH Items with * indicate a potential emergency and should be followed up as soon as possible.  Feel free to call the clinic should you have any questions or concerns. The clinic phone number is [phone].  Please show the CHEMO ALERT CARD at check-in to the Emergency Department and triage nurse.  Coronavirus (COVID-19) Are you at risk?  Are you at risk for the Coronavirus (COVID-19)?  To be considered HIGH RISK for Coronavirus (COVID-19), you have to meet the following criteria:  . Traveled to [place], [place], [place], [place] or [place]; or in the [place] to [place], [place], [place], or [place]; and have fever, cough, and shortness of breath within the last 2 weeks of travel OR . Been in close contact with a person diagnosed with COVID-19 within the last 2 weeks and have fever, cough, and shortness of breath . IF YOU DO NOT MEET THESE CRITERIA, YOU ARE CONSIDERED LOW RISK FOR COVID-19.  What to do if you are HIGH RISK for COVID-19?  . If you are having a medical emergency, call 911. . Seek medical care right away. Before you go to a doctor's office, urgent care or emergency department, call ahead and tell them about your  recent travel, contact with someone diagnosed with COVID-19, and your symptoms. You should receive instructions from your physician's office regarding next steps of care.  . When you arrive at healthcare provider, tell the healthcare staff immediately you have returned from visiting [place], [place], [place], [place] or [place]; or traveled in the [place] to [place], [place], [place], or [place]; in the last two weeks or you have been in close contact with a person diagnosed with COVID-19 in the last 2 weeks.   . Tell the health care staff about your symptoms: fever, cough and shortness of breath. . After you have been seen by a medical provider, you will be either: o Tested for (COVID-19) and discharged home on quarantine except to seek medical care if symptoms worsen, and asked to  - Stay home and avoid contact with others until you get your results (4-5 days)  - Avoid travel on public transportation if possible (such as bus, train, or airplane) or o Sent to the Emergency Department by EMS for evaluation, COVID-19 testing, and possible admission depending on your condition and test results.  What to do if you are LOW RISK for COVID-19?  Reduce your risk of any infection by using the same precautions used for avoiding the common cold or flu:  . Wash your hands often with soap and warm water for at least 20 seconds.  If soap and water are not readily available, use an alcohol-based hand sanitizer with at least 60% alcohol.  . If coughing   cover your mouth and nose by coughing or sneezing into the elbow areas of your shirt or coat, into a tissue or into your sleeve (not your hands). . Avoid shaking hands with others and consider head nods or verbal greetings only. . Avoid touching your eyes, nose, or mouth with unwashed hands.  . Avoid close contact with people who are sick. . Avoid places or events with large numbers of people in one location, like concerts or sporting  events. . Carefully consider travel plans you have or are making. . If you are planning any travel outside or inside the [place], visit the CDC's Travelers' Health webpage for the latest health notices. . If you have some symptoms but not all symptoms, continue to monitor at home and seek medical attention if your symptoms worsen. . If you are having a medical emergency, call 911.   [place] / e-Visit: [email or web address]         [facility]: [facility]: [phone]                   [facility]: [phone]

## 2018-08-30 ENCOUNTER — Telehealth: Payer: Self-pay | Admitting: Medical Oncology

## 2018-08-30 ENCOUNTER — Inpatient Hospital Stay: Payer: BC Managed Care – PPO

## 2018-08-30 ENCOUNTER — Other Ambulatory Visit: Payer: Self-pay

## 2018-08-30 NOTE — Telephone Encounter (Signed)
Mr Parisi is now under the care of Dr Theodoro Clock at [facility] for BMT . Per Dr Julien Nordmann I cancelled all future appts and pt notified and to contact us after he is transplanted.

## 2018-09-06 ENCOUNTER — Other Ambulatory Visit: Payer: BC Managed Care – PPO

## 2018-09-06 ENCOUNTER — Ambulatory Visit: Payer: BC Managed Care – PPO | Admitting: Internal Medicine

## 2018-09-06 ENCOUNTER — Ambulatory Visit: Payer: BC Managed Care – PPO

## 2018-09-06 ENCOUNTER — Other Ambulatory Visit: Payer: Self-pay | Admitting: Internal Medicine

## 2018-09-09 ENCOUNTER — Other Ambulatory Visit: Payer: Self-pay | Admitting: Internal Medicine

## 2018-10-22 ENCOUNTER — Other Ambulatory Visit: Payer: Self-pay | Admitting: *Deleted

## 2018-10-22 ENCOUNTER — Telehealth: Payer: Self-pay | Admitting: *Deleted

## 2018-10-22 ENCOUNTER — Telehealth: Payer: Self-pay | Admitting: Internal Medicine

## 2018-10-22 NOTE — Telephone Encounter (Signed)
[facility], [facility] and [facility].  Thank you.

## 2018-10-22 NOTE — Telephone Encounter (Signed)
Scheduled appt per 9/18 sch message - unable to reach pt . Left message with appt date and time   

## 2018-10-22 NOTE — Telephone Encounter (Signed)
Received call from [facility] from [facility] [place] [facility].  She states pt will be having his transition visit with Dr. Theodoro Clock on 10/28/18 and then pt will  Need to re-establish care with Dr. Julien Nordmann. There are requesting that visit be made between 11/03/18 and 11/12/18. Theadora Rama needs to be called and notified of appt date and time @ # [phone] so she can inform Dr. Theodoro Clock  Scheduling message sent.  Which labs would you ordered?  Please advise

## 2018-10-25 ENCOUNTER — Other Ambulatory Visit: Payer: Self-pay | Admitting: *Deleted

## 2018-10-25 DIAGNOSIS — C9000 Multiple myeloma not having achieved remission: Secondary | ICD-10-CM

## 2018-11-02 ENCOUNTER — Telehealth: Payer: Self-pay | Admitting: Medical Oncology

## 2018-11-02 ENCOUNTER — Telehealth: Payer: Self-pay | Admitting: Internal Medicine

## 2018-11-02 NOTE — Telephone Encounter (Signed)
Not feeling well post BMT. Wants to cancel labs and f/u this week. [facility] notified. I left message with pt to return my call as his appt with Julien Nordmann is very importent post BMT.

## 2018-11-02 NOTE — Telephone Encounter (Signed)
Last week he had trouble standing due to ankle swelling. He does not have any energy. He will keep apt for tomorrow. Schedule message sent.

## 2018-11-02 NOTE — Telephone Encounter (Signed)
Returned patient's phone call regarding rescheduling an appointment, per patient's request appointment has been moved to 10/13.

## 2018-11-03 ENCOUNTER — Encounter: Payer: Self-pay | Admitting: Internal Medicine

## 2018-11-03 ENCOUNTER — Other Ambulatory Visit: Payer: BC Managed Care – PPO

## 2018-11-03 ENCOUNTER — Ambulatory Visit: Payer: BC Managed Care – PPO | Admitting: Internal Medicine

## 2018-11-03 ENCOUNTER — Inpatient Hospital Stay: Payer: BC Managed Care – PPO | Attending: Internal Medicine

## 2018-11-03 ENCOUNTER — Other Ambulatory Visit: Payer: Self-pay

## 2018-11-03 ENCOUNTER — Inpatient Hospital Stay (HOSPITAL_BASED_OUTPATIENT_CLINIC_OR_DEPARTMENT_OTHER): Payer: BC Managed Care – PPO | Admitting: Internal Medicine

## 2018-11-03 VITALS — BP 123/84 | HR 105 | Temp 97.8°F | Resp 18 | Ht 75.0 in | Wt 226.0 lb

## 2018-11-03 DIAGNOSIS — C9000 Multiple myeloma not having achieved remission: Secondary | ICD-10-CM | POA: Diagnosis present

## 2018-11-03 DIAGNOSIS — Z9484 Stem cells transplant status: Secondary | ICD-10-CM | POA: Insufficient documentation

## 2018-11-03 DIAGNOSIS — Z79899 Other long term (current) drug therapy: Secondary | ICD-10-CM | POA: Diagnosis not present

## 2018-11-03 DIAGNOSIS — T451X5A Adverse effect of antineoplastic and immunosuppressive drugs, initial encounter: Secondary | ICD-10-CM | POA: Diagnosis not present

## 2018-11-03 DIAGNOSIS — R531 Weakness: Secondary | ICD-10-CM | POA: Diagnosis not present

## 2018-11-03 DIAGNOSIS — F419 Anxiety disorder, unspecified: Secondary | ICD-10-CM | POA: Diagnosis not present

## 2018-11-03 DIAGNOSIS — Z7901 Long term (current) use of anticoagulants: Secondary | ICD-10-CM | POA: Diagnosis not present

## 2018-11-03 DIAGNOSIS — G620 Drug-induced polyneuropathy: Secondary | ICD-10-CM | POA: Insufficient documentation

## 2018-11-03 DIAGNOSIS — C9001 Multiple myeloma in remission: Secondary | ICD-10-CM

## 2018-11-03 DIAGNOSIS — R5383 Other fatigue: Secondary | ICD-10-CM | POA: Diagnosis not present

## 2018-11-03 LAB — CMP (CANCER CENTER ONLY)
ALT: 23 U/L (ref 0–44)
AST: 14 U/L — ABNORMAL LOW (ref 15–41)
Albumin: 3.1 g/dL — ABNORMAL LOW (ref 3.5–5.0)
Alkaline Phosphatase: 72 U/L (ref 38–126)
Anion gap: 10 (ref 5–15)
BUN: 7 mg/dL — ABNORMAL LOW (ref 8–23)
CO2: 25 mmol/L (ref 22–32)
Calcium: 9.2 mg/dL (ref 8.9–10.3)
Chloride: 106 mmol/L (ref 98–111)
Creatinine: 0.96 mg/dL (ref 0.61–1.24)
GFR, Est AFR Am: >60 mL/min (ref >60)
GFR, Estimated: >60 mL/min (ref >60)
Glucose, Bld: 114 mg/dL — ABNORMAL HIGH (ref 70–99)
Potassium: 3.7 mmol/L (ref 3.5–5.1)
Sodium: 141 mmol/L (ref 135–145)
Total Bilirubin: 0.3 mg/dL (ref 0.3–1.2)
Total Protein: 7.6 g/dL (ref 6.5–8.1)

## 2018-11-03 LAB — CBC WITH DIFFERENTIAL (CANCER CENTER ONLY)
Abs Immature Granulocytes: 0.03 10*3/uL (ref 0.00–0.07)
Basophils Absolute: 0.1 10*3/uL (ref 0.0–0.1)
Basophils Relative: 1 %
Eosinophils Absolute: 0 10*3/uL (ref 0.0–0.5)
Eosinophils Relative: 0 %
HCT: 34.4 % — ABNORMAL LOW (ref 39.0–52.0)
Hemoglobin: 11.4 g/dL — ABNORMAL LOW (ref 13.0–17.0)
Immature Granulocytes: 1 %
Lymphocytes Relative: 28 %
Lymphs Abs: 1.5 10*3/uL (ref 0.7–4.0)
MCH: 32.9 pg (ref 26.0–34.0)
MCHC: 33.1 g/dL (ref 30.0–36.0)
MCV: 99.4 fL (ref 80.0–100.0)
Monocytes Absolute: 0.9 10*3/uL (ref 0.1–1.0)
Monocytes Relative: 17 %
Neutro Abs: 2.8 10*3/uL (ref 1.7–7.7)
Neutrophils Relative %: 53 %
Platelet Count: 289 10*3/uL (ref 150–400)
RBC: 3.46 MIL/uL — ABNORMAL LOW (ref 4.22–5.81)
RDW: 13.9 % (ref 11.5–15.5)
WBC Count: 5.3 10*3/uL (ref 4.0–10.5)
nRBC: 0 % (ref 0.0–0.2)

## 2018-11-03 NOTE — Progress Notes (Signed)
[place] [phone]   Fax:([phone]  OFFICE PROGRESS NOTE  Derinda Late, MD [phone] [address] [facility] [place] [address]  DIAGNOSIS: Multiple myeloma, IgG subtype diagnosed in January 2020.  PRIOR THERAPY: None  CURRENT THERAPY:  1) Systemic chemotherapy with Velcade 1.3 mg/M2 on days 1, 8, 15 as well as Revlimid 25 mg p.o. daily for 21 days every 4 weeks as well as Decadron 40 mg weekly.  First dose expected March 22, 2018.  Status post 6 cycles. 2) autologous stem cell transplant at [facility] on 10/08/2018.  INTERVAL HISTORY: Matthew Yates 68 y.o. male returns to the clinic today for follow-up visit.  The patient recently underwent autologous stem cell transplant at [facility] on October 08, 2018.  He is recovering slowly from this procedure.  He continues to complain of increasing fatigue and weakness.  He lost around 35 pounds in the last few months.  He eats much better and started gaining weight again.  He denied having any current chest pain, shortness of breath, cough or hemoptysis.  He denied having any fever or chills.  He has no nausea, vomiting, diarrhea or constipation.  He has no headache or visual changes.  He is here today for evaluation and repeat blood work.   MEDICAL HISTORY: Past Medical History:  Diagnosis Date  . Acute medial meniscus tear of left knee   . Arthritis    lt knee  . GERD (gastroesophageal reflux disease)   . Hypertension     ALLERGIES:  is allergic to other.  MEDICATIONS:  Current Outpatient Medications  Medication Sig Dispense Refill  . acyclovir (ZOVIRAX) 200 MG capsule TAKE 1 CAPSULE BY MOUTH TWICE A DAY 60 capsule 2  . ALPRAZolam (XANAX) 0.25 MG tablet Take 1 tablet (0.25 mg total) by mouth at bedtime as needed for anxiety. 30 tablet 0  . amLODipine (NORVASC) 5 MG tablet Take by mouth.    . dexamethasone (DECADRON) 4 MG tablet 10 tab  po once weekly with chemo 80 tablet 2  . esomeprazole (NEXIUM) 20 MG capsule Take 1 capsule (20 mg total) by mouth daily before breakfast. 30 capsule 1  . gabapentin (NEURONTIN) 100 MG capsule Take 1 capsule (100 mg total) by mouth 3 (three) times daily. 90 capsule 1  . HYDROcodone-acetaminophen (NORCO) 7.5-325 MG tablet Take 1-2 tablets by mouth every 6 (six) hours as needed for moderate pain. 90 tablet 0  . ibuprofen (ADVIL,MOTRIN) 400 MG tablet Take 400 mg by mouth every 6 (six) hours as needed.    Marland Kitchen lenalidomide (REVLIMID) 25 MG capsule TAKE 1 CAPSULE BY MOUTH EVERY DAY FOR 21 DAYS , THEN OFF FOR 7 DAYS Adult male 08/10/2018 auth number [id number] 21 capsule 0  . loratadine-pseudoephedrine (CLARITIN-D 12-HOUR) 5-120 MG tablet Take 1 tablet by mouth 2 (two) times daily.    Marland Kitchen LORazepam (ATIVAN) 0.5 MG tablet Take by mouth.    . ondansetron (ZOFRAN) 4 MG tablet Take 1-2 tablets (4-8 mg total) by mouth every 8 (eight) hours as needed for nausea or vomiting. 40 tablet 0  . prochlorperazine (COMPAZINE) 10 MG tablet Take 1 tablet (10 mg total) by mouth every 6 (six) hours as needed for nausea or vomiting. 30 tablet 0  . senna-docusate (SENOKOT S) 8.6-50 MG tablet Take 1 tablet by mouth at bedtime as needed. 30 tablet 1  . warfarin (COUMADIN) 2 MG tablet TAKE 1  TABLET BY MOUTH EVERY DAY 90 tablet 1   No current facility-administered medications for this visit.     SURGICAL HISTORY:  Past Surgical History:  Procedure Laterality Date  . CHONDROPLASTY Left 07/09/2016   Procedure: CHONDROPLASTY with MICROFRACTURE;  Surgeon: Leandrew Koyanagi, MD;  Location: [place];  Service: Orthopedics;  Laterality: Left;  . COLONOSCOPY     x2  . HIP SURGERY     aspiration only, no anesthesia  . KNEE ARTHROSCOPY WITH MEDIAL MENISECTOMY Left 07/09/2016   Procedure: LEFT KNEE ARTHROSCOPY WITH PARTIAL MEDIAL MENISCECTOMY;  Surgeon: Leandrew Koyanagi, MD;  Location: [place];  Service: Orthopedics;   Laterality: Left;    REVIEW OF SYSTEMS:  A comprehensive review of systems was negative except for: Constitutional: positive for fatigue and weight loss   PHYSICAL EXAMINATION: General appearance: alert, cooperative, fatigued and no distress Head: Normocephalic, without obvious abnormality, atraumatic Neck: no adenopathy, no JVD, supple, symmetrical, trachea midline and thyroid not enlarged, symmetric, no tenderness/mass/nodules Lymph nodes: Cervical, supraclavicular, and axillary nodes normal. Resp: clear to auscultation bilaterally Back: symmetric, no curvature. ROM normal. No CVA tenderness. Cardio: regular rate and rhythm, S1, S2 normal, no murmur, click, rub or gallop GI: soft, non-tender; bowel sounds normal; no masses,  no organomegaly Extremities: extremities normal, atraumatic, no cyanosis or edema  ECOG PERFORMANCE STATUS: 1 - Symptomatic but completely ambulatory  Blood pressure 123/84, pulse (!) 105, temperature 97.8 F (36.6 C), temperature source Oral, resp. rate 18, height _0  (1.905 m), weight 226 lb (102.5 kg), SpO2 100 %.  LABORATORY DATA: Lab Results  Component Value Date   WBC 5.3 11/03/2018   HGB 11.4 (L) 11/03/2018   HCT 34.4 (L) 11/03/2018   MCV 99.4 11/03/2018   PLT 289 11/03/2018      Chemistry      Component Value Date/Time   NA 141 11/03/2018 1113   K 3.7 11/03/2018 1113   CL 106 11/03/2018 1113   CO2 25 11/03/2018 1113   BUN 7 (L) 11/03/2018 1113   CREATININE 0.96 11/03/2018 1113      Component Value Date/Time   CALCIUM 9.2 11/03/2018 1113   ALKPHOS 72 11/03/2018 1113   AST 14 (L) 11/03/2018 1113   ALT 23 11/03/2018 1113   BILITOT 0.3 11/03/2018 1113       RADIOGRAPHIC STUDIES: No results found.  ASSESSMENT AND PLAN: This is a very pleasant 68 years old African-American male recently diagnosed with multiple myeloma, IgG subtype.  He is currently undergoing treatment with Revlimid, Velcade and Decadron status post 6 cycles. The  patient continues to tolerate this treatment well with no concerning adverse effect except for mild peripheral neuropathy. The patient has very good response to this treatment. On October 08, 2018 he underwent autologous stem cell transplant at [facility] and tolerated the procedure well but recovering slowly from the side effects. He is feeling a little bit better today except for fatigue. The peripheral neuropathy he will continue his current treatment with gabapentin. For anxiety we will continue his current treatment with Xanax. I will see him back for follow-up visit in 2 months for evaluation with repeat blood work.  He will also continue his routine follow-up visit and evaluation by the stem cell transplant team at [facility]. The patient was advised to call immediately if he has any concerning symptoms in the interval. The patient voices understanding of current disease status and treatment options and is in agreement with  the current care plan.  All questions were answered. The patient knows to call the clinic with any problems, questions or concerns. We can certainly see the patient much sooner if necessary.  I spent 10 minutes counseling the patient face to face. The total time spent in the appointment was 15 minutes.  Disclaimer: This note was dictated with voice recognition software. Similar sounding words can inadvertently be transcribed and may not be corrected upon review.

## 2018-11-04 ENCOUNTER — Telehealth: Payer: Self-pay | Admitting: Internal Medicine

## 2018-11-04 NOTE — Telephone Encounter (Signed)
Scheduled appt per 9/30 los - pt aware of appt date and time

## 2018-11-16 ENCOUNTER — Other Ambulatory Visit: Payer: BC Managed Care – PPO

## 2018-11-16 ENCOUNTER — Ambulatory Visit: Payer: BC Managed Care – PPO | Admitting: Internal Medicine

## 2019-01-03 ENCOUNTER — Inpatient Hospital Stay: Payer: BC Managed Care – PPO | Attending: Internal Medicine | Admitting: Internal Medicine

## 2019-01-03 ENCOUNTER — Telehealth: Payer: Self-pay | Admitting: Internal Medicine

## 2019-01-03 ENCOUNTER — Encounter: Payer: Self-pay | Admitting: Internal Medicine

## 2019-01-03 ENCOUNTER — Inpatient Hospital Stay: Payer: BC Managed Care – PPO

## 2019-01-03 ENCOUNTER — Other Ambulatory Visit: Payer: Self-pay

## 2019-01-03 VITALS — BP 118/79 | HR 101 | Temp 98.0°F | Resp 20 | Ht 75.0 in | Wt 232.6 lb

## 2019-01-03 DIAGNOSIS — Z79899 Other long term (current) drug therapy: Secondary | ICD-10-CM | POA: Diagnosis not present

## 2019-01-03 DIAGNOSIS — D649 Anemia, unspecified: Secondary | ICD-10-CM | POA: Diagnosis not present

## 2019-01-03 DIAGNOSIS — C9001 Multiple myeloma in remission: Secondary | ICD-10-CM

## 2019-01-03 DIAGNOSIS — F419 Anxiety disorder, unspecified: Secondary | ICD-10-CM | POA: Diagnosis not present

## 2019-01-03 DIAGNOSIS — Z9484 Stem cells transplant status: Secondary | ICD-10-CM | POA: Diagnosis not present

## 2019-01-03 DIAGNOSIS — T451X5A Adverse effect of antineoplastic and immunosuppressive drugs, initial encounter: Secondary | ICD-10-CM | POA: Insufficient documentation

## 2019-01-03 DIAGNOSIS — C9000 Multiple myeloma not having achieved remission: Secondary | ICD-10-CM | POA: Diagnosis not present

## 2019-01-03 DIAGNOSIS — I10 Essential (primary) hypertension: Secondary | ICD-10-CM | POA: Diagnosis not present

## 2019-01-03 DIAGNOSIS — G620 Drug-induced polyneuropathy: Secondary | ICD-10-CM | POA: Insufficient documentation

## 2019-01-03 LAB — CBC WITH DIFFERENTIAL (CANCER CENTER ONLY)
Abs Immature Granulocytes: 0.02 10*3/uL (ref 0.00–0.07)
Basophils Absolute: 0 10*3/uL (ref 0.0–0.1)
Basophils Relative: 1 %
Eosinophils Absolute: 0.1 10*3/uL (ref 0.0–0.5)
Eosinophils Relative: 1 %
HCT: 37.1 % — ABNORMAL LOW (ref 39.0–52.0)
Hemoglobin: 12.1 g/dL — ABNORMAL LOW (ref 13.0–17.0)
Immature Granulocytes: 0 %
Lymphocytes Relative: 42 %
Lymphs Abs: 2.3 10*3/uL (ref 0.7–4.0)
MCH: 32.1 pg (ref 26.0–34.0)
MCHC: 32.6 g/dL (ref 30.0–36.0)
MCV: 98.4 fL (ref 80.0–100.0)
Monocytes Absolute: 0.5 10*3/uL (ref 0.1–1.0)
Monocytes Relative: 9 %
Neutro Abs: 2.6 10*3/uL (ref 1.7–7.7)
Neutrophils Relative %: 47 %
Platelet Count: 197 10*3/uL (ref 150–400)
RBC: 3.77 MIL/uL — ABNORMAL LOW (ref 4.22–5.81)
RDW: 14.1 % (ref 11.5–15.5)
WBC Count: 5.5 10*3/uL (ref 4.0–10.5)
nRBC: 0 % (ref 0.0–0.2)

## 2019-01-03 LAB — CMP (CANCER CENTER ONLY)
ALT: 12 U/L (ref 0–44)
AST: 14 U/L — ABNORMAL LOW (ref 15–41)
Albumin: 3.6 g/dL (ref 3.5–5.0)
Alkaline Phosphatase: 63 U/L (ref 38–126)
Anion gap: 12 (ref 5–15)
BUN: 11 mg/dL (ref 8–23)
CO2: 26 mmol/L (ref 22–32)
Calcium: 9.3 mg/dL (ref 8.9–10.3)
Chloride: 106 mmol/L (ref 98–111)
Creatinine: 1.02 mg/dL (ref 0.61–1.24)
GFR, Est AFR Am: >60 mL/min (ref >60)
GFR, Estimated: >60 mL/min (ref >60)
Glucose, Bld: 106 mg/dL — ABNORMAL HIGH (ref 70–99)
Potassium: 3.5 mmol/L (ref 3.5–5.1)
Sodium: 144 mmol/L (ref 135–145)
Total Bilirubin: 0.3 mg/dL (ref 0.3–1.2)
Total Protein: 7.3 g/dL (ref 6.5–8.1)

## 2019-01-03 LAB — LACTATE DEHYDROGENASE: LDH: 123 U/L (ref 98–192)

## 2019-01-03 MED ORDER — GABAPENTIN 100 MG PO CAPS: 200.0000 mg | ORAL_CAPSULE | Freq: Three times a day (TID) | ORAL | 1 refills | Status: DC

## 2019-01-03 NOTE — Telephone Encounter (Signed)
Scheduled per los. Called and spoke with patient.confirmed appts

## 2019-01-03 NOTE — Progress Notes (Signed)
[place] [phone]   [phone]) [phone]  OFFICE PROGRESS NOTE  Derinda Late, MD [phone] [address] [facility] [place] [address]  DIAGNOSIS: Multiple myeloma, IgG subtype diagnosed in January 2020.  PRIOR THERAPY:  1) Systemic chemotherapy with Velcade 1.3 mg/M2 on days 1, 8, 15 as well as Revlimid 25 mg p.o. daily for 21 days every 4 weeks as well as Decadron 40 mg weekly.  First dose expected March 22, 2018.  Status post 6 cycles. 2) autologous stem cell transplant at [facility] on 10/08/2018.  CURRENT THERAPY:  None.   INTERVAL HISTORY: Matthew Yates 68 y.o. male returns to the clinic today for follow-up visit.  The patient is feeling fine today with no concerning complaints except for the persistent peripheral neuropathy.  He was treated with Neurontin in the past but he has not take it for several months.  He denied having any current chest pain, shortness of breath, cough or hemoptysis.  He has no nausea, vomiting, diarrhea or constipation.  He is here today for evaluation and repeat blood work.   MEDICAL HISTORY: Past Medical History:  Diagnosis Date  . Acute medial meniscus tear of left knee   . Arthritis    lt knee  . GERD (gastroesophageal reflux disease)   . Hypertension     ALLERGIES:  is allergic to other.  MEDICATIONS:  Current Outpatient Medications  Medication Sig Dispense Refill  . acyclovir (ZOVIRAX) 200 MG capsule TAKE 1 CAPSULE BY MOUTH TWICE A DAY 60 capsule 2  . ALPRAZolam (XANAX) 0.25 MG tablet Take 1 tablet (0.25 mg total) by mouth at bedtime as needed for anxiety. 30 tablet 0  . amLODipine (NORVASC) 5 MG tablet Take by mouth.    . dexamethasone (DECADRON) 4 MG tablet 10 tab po once weekly with chemo (Patient not taking: Reported on 11/03/2018) 80 tablet 2  . esomeprazole (NEXIUM) 20 MG capsule Take 1 capsule (20 mg total) by mouth daily before breakfast. 30  capsule 1  . gabapentin (NEURONTIN) 100 MG capsule Take 1 capsule (100 mg total) by mouth 3 (three) times daily. (Patient not taking: Reported on 11/03/2018) 90 capsule 1  . HYDROcodone-acetaminophen (NORCO) 7.5-325 MG tablet Take 1-2 tablets by mouth every 6 (six) hours as needed for moderate pain. (Patient not taking: Reported on 11/03/2018) 90 tablet 0  . ibuprofen (ADVIL,MOTRIN) 400 MG tablet Take 400 mg by mouth every 6 (six) hours as needed.    Marland Kitchen lenalidomide (REVLIMID) 25 MG capsule TAKE 1 CAPSULE BY MOUTH EVERY DAY FOR 21 DAYS , THEN OFF FOR 7 DAYS Adult male 08/10/2018 auth number [id number] (Patient not taking: Reported on 11/03/2018) 21 capsule 0  . loratadine-pseudoephedrine (CLARITIN-D 12-HOUR) 5-120 MG tablet Take 1 tablet by mouth 2 (two) times daily.    Marland Kitchen LORazepam (ATIVAN) 0.5 MG tablet Take by mouth.    . ondansetron (ZOFRAN) 4 MG tablet Take 1-2 tablets (4-8 mg total) by mouth every 8 (eight) hours as needed for nausea or vomiting. 40 tablet 0  . prochlorperazine (COMPAZINE) 10 MG tablet Take 1 tablet (10 mg total) by mouth every 6 (six) hours as needed for nausea or vomiting. 30 tablet 0  . senna-docusate (SENOKOT S) 8.6-50 MG tablet Take 1 tablet by mouth at bedtime as needed. 30 tablet 1  . warfarin (COUMADIN) 2 MG tablet TAKE 1 TABLET BY MOUTH EVERY DAY (Patient not taking: Reported on 11/03/2018)  90 tablet 1   No current facility-administered medications for this visit.     SURGICAL HISTORY:  Past Surgical History:  Procedure Laterality Date  . CHONDROPLASTY Left 07/09/2016   Procedure: CHONDROPLASTY with MICROFRACTURE;  Surgeon: Leandrew Koyanagi, MD;  Location: [place];  Service: Orthopedics;  Laterality: Left;  . COLONOSCOPY     x2  . HIP SURGERY     aspiration only, no anesthesia  . KNEE ARTHROSCOPY WITH MEDIAL MENISECTOMY Left 07/09/2016   Procedure: LEFT KNEE ARTHROSCOPY WITH PARTIAL MEDIAL MENISCECTOMY;  Surgeon: Leandrew Koyanagi, MD;  Location: [place];  Service: Orthopedics;  Laterality: Left;    REVIEW OF SYSTEMS:  A comprehensive review of systems was negative except for: Constitutional: positive for fatigue Neurological: positive for paresthesia   PHYSICAL EXAMINATION: General appearance: alert, cooperative, fatigued and no distress Head: Normocephalic, without obvious abnormality, atraumatic Neck: no adenopathy, no JVD, supple, symmetrical, trachea midline and thyroid not enlarged, symmetric, no tenderness/mass/nodules Lymph nodes: Cervical, supraclavicular, and axillary nodes normal. Resp: clear to auscultation bilaterally Back: symmetric, no curvature. ROM normal. No CVA tenderness. Cardio: regular rate and rhythm, S1, S2 normal, no murmur, click, rub or gallop GI: soft, non-tender; bowel sounds normal; no masses,  no organomegaly Extremities: extremities normal, atraumatic, no cyanosis or edema  ECOG PERFORMANCE STATUS: 1 - Symptomatic but completely ambulatory  Blood pressure 118/79, pulse (!) 101, temperature 98 F (36.7 C), temperature source Oral, resp. rate 20, height '6\' 3"'$  (1.905 m), weight 232 lb 9.6 oz (105.5 kg), SpO2 100 %.  LABORATORY DATA: Lab Results  Component Value Date   WBC 5.5 01/03/2019   HGB 12.1 (L) 01/03/2019   HCT 37.1 (L) 01/03/2019   MCV 98.4 01/03/2019   PLT 197 01/03/2019      Chemistry      Component Value Date/Time   NA 141 11/03/2018 1113   K 3.7 11/03/2018 1113   CL 106 11/03/2018 1113   CO2 25 11/03/2018 1113   BUN 7 (L) 11/03/2018 1113   CREATININE 0.96 11/03/2018 1113      Component Value Date/Time   CALCIUM 9.2 11/03/2018 1113   ALKPHOS 72 11/03/2018 1113   AST 14 (L) 11/03/2018 1113   ALT 23 11/03/2018 1113   BILITOT 0.3 11/03/2018 1113       RADIOGRAPHIC STUDIES: No results found.  ASSESSMENT AND PLAN: This is a very pleasant 68 years old African-American male recently diagnosed with multiple myeloma, IgG subtype.  He is currently undergoing treatment  with Revlimid, Velcade and Decadron status post 6 cycles. The patient continues to tolerate this treatment well with no concerning adverse effect except for mild peripheral neuropathy. The patient has very good response to this treatment. On October 08, 2018 he underwent autologous stem cell transplant at [facility] and tolerated the procedure well but recovering slowly from the side effects. The patient is currently on observation and he is feeling fine. His lab work today is unremarkable except for mild anemia. I recommended for the patient to continue on observation.  I will see him back for follow-up visit in 3 months for evaluation with repeat myeloma panel. We may consider the patient for maintenance treatment with Revlimid after day 100 of his transplant. For the peripheral neuropathy, I he will resume his treatment with gabapentin and I will increase the dose to 200 mg p.o. 3 times daily. He was advised to call immediately if he has any concerning symptoms in the interval.  The patient voices understanding of current disease status and treatment options and is in agreement with the current care plan. All questions were answered. The patient knows to call the clinic with any problems, questions or concerns. We can certainly see the patient much sooner if necessary.  I spent 10 minutes counseling the patient face to face. The total time spent in the appointment was 15 minutes.  Disclaimer: This note was dictated with voice recognition software. Similar sounding words can inadvertently be transcribed and may not be corrected upon review.

## 2019-03-14 ENCOUNTER — Other Ambulatory Visit: Payer: Self-pay | Admitting: Internal Medicine

## 2019-03-24 ENCOUNTER — Ambulatory Visit: Payer: BC Managed Care – PPO

## 2019-03-28 ENCOUNTER — Ambulatory Visit: Payer: Self-pay | Attending: Family

## 2019-03-28 ENCOUNTER — Inpatient Hospital Stay: Payer: Self-pay | Attending: Internal Medicine

## 2019-03-28 ENCOUNTER — Other Ambulatory Visit: Payer: Self-pay

## 2019-03-28 DIAGNOSIS — C9001 Multiple myeloma in remission: Secondary | ICD-10-CM | POA: Insufficient documentation

## 2019-03-28 DIAGNOSIS — Z23 Encounter for immunization: Secondary | ICD-10-CM | POA: Insufficient documentation

## 2019-03-28 LAB — CBC WITH DIFFERENTIAL (CANCER CENTER ONLY)
Abs Immature Granulocytes: 0.01 10*3/uL (ref 0.00–0.07)
Basophils Absolute: 0 10*3/uL (ref 0.0–0.1)
Basophils Relative: 0 %
Eosinophils Absolute: 0.1 10*3/uL (ref 0.0–0.5)
Eosinophils Relative: 3 %
HCT: 41.4 % (ref 39.0–52.0)
Hemoglobin: 13.9 g/dL (ref 13.0–17.0)
Immature Granulocytes: 0 %
Lymphocytes Relative: 38 %
Lymphs Abs: 2 10*3/uL (ref 0.7–4.0)
MCH: 32.3 pg (ref 26.0–34.0)
MCHC: 33.6 g/dL (ref 30.0–36.0)
MCV: 96.3 fL (ref 80.0–100.0)
Monocytes Absolute: 0.5 10*3/uL (ref 0.1–1.0)
Monocytes Relative: 10 %
Neutro Abs: 2.5 10*3/uL (ref 1.7–7.7)
Neutrophils Relative %: 49 %
Platelet Count: 207 10*3/uL (ref 150–400)
RBC: 4.3 MIL/uL (ref 4.22–5.81)
RDW: 13 % (ref 11.5–15.5)
WBC Count: 5.2 10*3/uL (ref 4.0–10.5)
nRBC: 0 % (ref 0.0–0.2)

## 2019-03-28 LAB — CMP (CANCER CENTER ONLY)
ALT: 14 U/L (ref 0–44)
AST: 13 U/L — ABNORMAL LOW (ref 15–41)
Albumin: 3.9 g/dL (ref 3.5–5.0)
Alkaline Phosphatase: 90 U/L (ref 38–126)
Anion gap: 9 (ref 5–15)
BUN: 14 mg/dL (ref 8–23)
CO2: 27 mmol/L (ref 22–32)
Calcium: 9.4 mg/dL (ref 8.9–10.3)
Chloride: 106 mmol/L (ref 98–111)
Creatinine: 1.06 mg/dL (ref 0.61–1.24)
GFR, Est AFR Am: >60 mL/min (ref >60)
GFR, Estimated: >60 mL/min (ref >60)
Glucose, Bld: 101 mg/dL — ABNORMAL HIGH (ref 70–99)
Potassium: 3.9 mmol/L (ref 3.5–5.1)
Sodium: 142 mmol/L (ref 135–145)
Total Bilirubin: 0.4 mg/dL (ref 0.3–1.2)
Total Protein: 7.5 g/dL (ref 6.5–8.1)

## 2019-03-28 LAB — LACTATE DEHYDROGENASE: LDH: 140 U/L (ref 98–192)

## 2019-03-28 NOTE — Progress Notes (Signed)
   Covid-19 Vaccination Clinic  Name:  EVERTON FORSTNER    MRN: [id number] DOB: Jul 22, 1950  03/28/2019  Mr. Giammanco was observed post Covid-19 immunization for 15 minutes without incidence. He was provided with Vaccine Information Sheet and instruction to access the V-Safe system.   Mr. Kozub was instructed to call 911 with any severe reactions post vaccine: Marland Kitchen Difficulty breathing  . Swelling of your face and throat  . A fast heartbeat  . A bad rash all over your body  . Dizziness and weakness    Immunizations Administered    Name Date Dose VIS Date Route   Moderna COVID-19 Vaccine 03/28/2019 12:45 PM 0.5 mL 01/04/2019 Intramuscular   Manufacturer: Moderna   Lot: [id number]   [id number]

## 2019-03-29 LAB — KAPPA/LAMBDA LIGHT CHAINS
Kappa free light chain: 30.1 mg/L — ABNORMAL HIGH (ref 3.3–19.4)
Kappa, lambda light chain ratio: 2.53 — ABNORMAL HIGH (ref 0.26–1.65)
Lambda free light chains: 11.9 mg/L (ref 5.7–26.3)

## 2019-03-29 LAB — IGG, IGA, IGM
IgA: 69 mg/dL (ref 61–437)
IgG (Immunoglobin G), Serum: 1438 mg/dL (ref 603–1613)
IgM (Immunoglobulin M), Srm: 40 mg/dL (ref 20–172)

## 2019-03-29 LAB — BETA 2 MICROGLOBULIN, SERUM: Beta-2 Microglobulin: 2 mg/L (ref 0.6–2.4)

## 2019-04-04 ENCOUNTER — Inpatient Hospital Stay: Payer: Self-pay | Attending: Internal Medicine | Admitting: Internal Medicine

## 2019-04-04 ENCOUNTER — Other Ambulatory Visit: Payer: Self-pay | Admitting: Medical Oncology

## 2019-04-04 ENCOUNTER — Encounter: Payer: Self-pay | Admitting: Internal Medicine

## 2019-04-04 ENCOUNTER — Other Ambulatory Visit: Payer: Self-pay

## 2019-04-04 VITALS — BP 136/91 | HR 93 | Temp 98.7°F | Resp 18 | Ht 75.0 in | Wt 257.2 lb

## 2019-04-04 DIAGNOSIS — F419 Anxiety disorder, unspecified: Secondary | ICD-10-CM

## 2019-04-04 DIAGNOSIS — Z5111 Encounter for antineoplastic chemotherapy: Secondary | ICD-10-CM

## 2019-04-04 DIAGNOSIS — T451X5A Adverse effect of antineoplastic and immunosuppressive drugs, initial encounter: Secondary | ICD-10-CM | POA: Insufficient documentation

## 2019-04-04 DIAGNOSIS — C9000 Multiple myeloma not having achieved remission: Secondary | ICD-10-CM | POA: Insufficient documentation

## 2019-04-04 DIAGNOSIS — C9001 Multiple myeloma in remission: Secondary | ICD-10-CM

## 2019-04-04 DIAGNOSIS — Z79899 Other long term (current) drug therapy: Secondary | ICD-10-CM | POA: Insufficient documentation

## 2019-04-04 DIAGNOSIS — G620 Drug-induced polyneuropathy: Secondary | ICD-10-CM | POA: Insufficient documentation

## 2019-04-04 DIAGNOSIS — Z9484 Stem cells transplant status: Secondary | ICD-10-CM | POA: Insufficient documentation

## 2019-04-04 MED ORDER — LENALIDOMIDE 10 MG PO CAPS: 10.0000 mg | ORAL_CAPSULE | Freq: Every day | ORAL | 2 refills | Status: DC

## 2019-04-04 NOTE — Progress Notes (Signed)
[place] Telephone:(336) [phone]   Fax:(336) [phone]  OFFICE PROGRESS NOTE  Derinda Late, MD [phone] [address] [facility] [place] [address]  DIAGNOSIS: Multiple myeloma, IgG subtype diagnosed in January 2020.  PRIOR THERAPY:  1) Systemic chemotherapy with Velcade 1.3 mg/M2 on days 1, 8, 15 as well as Revlimid 25 mg p.o. daily for 21 days every 4 weeks as well as Decadron 40 mg weekly.  First dose expected March 22, 2018.  Status post 6 cycles. 2) autologous stem cell transplant at [facility] on 10/08/2018.  CURRENT THERAPY:  Maintenance Revlimid 10 mg p.o. daily.   INTERVAL HISTORY: Matthew Yates 69 y.o. male returns to the clinic today for follow-up visit.  The patient is feeling fine today with no concerning complaints.  He denied having any chest pain, shortness of breath, cough or hemoptysis.  He denied having any recent weight loss or night sweats.  He has no nausea, vomiting, diarrhea or constipation.  He denied having any headache or visual changes.  He received the first dose of his Covid vaccine last week.  The patient denied having any fever or chills.  He has been in observation the last few months.  He is here today for evaluation with repeat myeloma panel.   MEDICAL HISTORY: Past Medical History:  Diagnosis Date  . Acute medial meniscus tear of left knee   . Arthritis    lt knee  . GERD (gastroesophageal reflux disease)   . Hypertension     ALLERGIES:  is allergic to other.  MEDICATIONS:  Current Outpatient Medications  Medication Sig Dispense Refill  . acyclovir (ZOVIRAX) 200 MG capsule TAKE 1 CAPSULE BY MOUTH TWICE A DAY (Patient not taking: Reported on 01/03/2019) 60 capsule 2  . ALPRAZolam (XANAX) 0.25 MG tablet Take 1 tablet (0.25 mg total) by mouth at bedtime as needed for anxiety. 30 tablet 0  . amLODipine (NORVASC) 5 MG tablet Take by mouth.    . clonazePAM (KLONOPIN) 0.5 MG  tablet Take 1 tablet by mouth 2 (two) times daily as needed.    Marland Kitchen dexamethasone (DECADRON) 4 MG tablet 10 tab po once weekly with chemo (Patient not taking: Reported on 11/03/2018) 80 tablet 2  . esomeprazole (NEXIUM) 20 MG capsule Take 1 capsule (20 mg total) by mouth daily before breakfast. 30 capsule 1  . gabapentin (NEURONTIN) 100 MG capsule TAKE 2 CAPSULES (200 MG TOTAL) BY MOUTH 3 (THREE) TIMES DAILY. 180 capsule 1  . HYDROcodone-acetaminophen (NORCO) 7.5-325 MG tablet Take 1-2 tablets by mouth every 6 (six) hours as needed for moderate pain. (Patient not taking: Reported on 11/03/2018) 90 tablet 0  . ibuprofen (ADVIL,MOTRIN) 400 MG tablet Take 400 mg by mouth every 6 (six) hours as needed.    Marland Kitchen lenalidomide (REVLIMID) 25 MG capsule TAKE 1 CAPSULE BY MOUTH EVERY DAY FOR 21 DAYS , THEN OFF FOR 7 DAYS Adult male 08/10/2018 auth number [id number] (Patient not taking: Reported on 11/03/2018) 21 capsule 0  . loratadine-pseudoephedrine (CLARITIN-D 12-HOUR) 5-120 MG tablet Take 1 tablet by mouth 2 (two) times daily.    Marland Kitchen LORazepam (ATIVAN) 0.5 MG tablet Take by mouth.    . ondansetron (ZOFRAN) 4 MG tablet Take 1-2 tablets (4-8 mg total) by mouth every 8 (eight) hours as needed for nausea or vomiting. (Patient not taking: Reported on 01/03/2019) 40 tablet 0  . prochlorperazine (COMPAZINE) 10 MG tablet Take 1 tablet (10  mg total) by mouth every 6 (six) hours as needed for nausea or vomiting. (Patient not taking: Reported on 01/03/2019) 30 tablet 0  . senna-docusate (SENOKOT S) 8.6-50 MG tablet Take 1 tablet by mouth at bedtime as needed. (Patient not taking: Reported on 01/03/2019) 30 tablet 1  . traZODone (DESYREL) 50 MG tablet Take 1 tablet by mouth at bedtime as needed.    . vitamin B-12 (CYANOCOBALAMIN) 1000 MCG tablet Take by mouth.    . warfarin (COUMADIN) 2 MG tablet TAKE 1 TABLET BY MOUTH EVERY DAY 90 tablet 1   No current facility-administered medications for this visit.    SURGICAL HISTORY:  Past  Surgical History:  Procedure Laterality Date  . CHONDROPLASTY Left 07/09/2016   Procedure: CHONDROPLASTY with MICROFRACTURE;  Surgeon: Leandrew Koyanagi, MD;  Location: [place];  Service: Orthopedics;  Laterality: Left;  . COLONOSCOPY     x2  . HIP SURGERY     aspiration only, no anesthesia  . KNEE ARTHROSCOPY WITH MEDIAL MENISECTOMY Left 07/09/2016   Procedure: LEFT KNEE ARTHROSCOPY WITH PARTIAL MEDIAL MENISCECTOMY;  Surgeon: Leandrew Koyanagi, MD;  Location: [place];  Service: Orthopedics;  Laterality: Left;    REVIEW OF SYSTEMS:  Constitutional: negative Eyes: negative Ears, nose, mouth, throat, and face: negative Respiratory: negative Cardiovascular: negative Gastrointestinal: negative Genitourinary:negative Integument/breast: negative Hematologic/lymphatic: negative Musculoskeletal:negative Neurological: negative Behavioral/Psych: negative Endocrine: negative Allergic/Immunologic: negative   PHYSICAL EXAMINATION: General appearance: alert, cooperative, fatigued and no distress Head: Normocephalic, without obvious abnormality, atraumatic Neck: no adenopathy, no JVD, supple, symmetrical, trachea midline and thyroid not enlarged, symmetric, no tenderness/mass/nodules Lymph nodes: Cervical, supraclavicular, and axillary nodes normal. Resp: clear to auscultation bilaterally Back: symmetric, no curvature. ROM normal. No CVA tenderness. Cardio: regular rate and rhythm, S1, S2 normal, no murmur, click, rub or gallop GI: soft, non-tender; bowel sounds normal; no masses,  no organomegaly Extremities: extremities normal, atraumatic, no cyanosis or edema Neurologic: Alert and oriented X 3, normal strength and tone. Normal symmetric reflexes. Normal coordination and gait  ECOG PERFORMANCE STATUS: 1 - Symptomatic but completely ambulatory  Blood pressure (!) 136/91, pulse 93, temperature 98.7 F (37.1 C), temperature source Temporal, resp. rate 18, height 6' 3"   (1.905 m), weight 257 lb 3.2 oz (116.7 kg), SpO2 98 %.  LABORATORY DATA: Lab Results  Component Value Date   WBC 5.2 03/28/2019   HGB 13.9 03/28/2019   HCT 41.4 03/28/2019   MCV 96.3 03/28/2019   PLT 207 03/28/2019      Chemistry      Component Value Date/Time   NA 142 03/28/2019 0837   K 3.9 03/28/2019 0837   CL 106 03/28/2019 0837   CO2 27 03/28/2019 0837   BUN 14 03/28/2019 0837   CREATININE 1.06 03/28/2019 0837      Component Value Date/Time   CALCIUM 9.4 03/28/2019 0837   ALKPHOS 90 03/28/2019 0837   AST 13 (L) 03/28/2019 0837   ALT 14 03/28/2019 0837   BILITOT 0.4 03/28/2019 0837       RADIOGRAPHIC STUDIES: No results found.  ASSESSMENT AND PLAN: This is a very pleasant 69 years old African-American male recently diagnosed with multiple myeloma, IgG subtype.  He is currently undergoing treatment with Revlimid, Velcade and Decadron status post 6 cycles. The patient continues to tolerate this treatment well with no concerning adverse effect except for mild peripheral neuropathy. The patient has very good response to this treatment. On October 08, 2018 he underwent autologous stem cell  transplant at [facility] and tolerated the procedure well  The patient is doing fine today with no concerning complaints.  He has been in observation for the last 6 months. His myeloma panel showed no concerning findings for disease progression. I had a lengthy discussion with the patient today about his condition.  I recommended for him to start treatment with maintenance Revlimid 10 mg p.o. daily. The patient is interested in the treatment and he will sign the consent today. I will see him back for follow-up visit in 6 weeks for evaluation and repeat blood work. He will also continue to have routine follow-up visit with the stem cell transplant team at [facility]. For the peripheral neuropathy, he will continue treatment with gabapentin. The patient was advised to call  immediately if he has any concerning symptoms in the interval.  The patient voices understanding of current disease status and treatment options and is in agreement with the current care plan. All questions were answered. The patient knows to call the clinic with any problems, questions or concerns. We can certainly see the patient much sooner if necessary.    Disclaimer: This note was dictated with voice recognition software. Similar sounding words can inadvertently be transcribed and may not be corrected upon review.

## 2019-04-06 ENCOUNTER — Encounter: Payer: Self-pay | Admitting: Medical Oncology

## 2019-04-06 ENCOUNTER — Telehealth: Payer: Self-pay | Admitting: Medical Oncology

## 2019-04-06 NOTE — Telephone Encounter (Signed)
Per Truddie Crumble at [facility] they have  rx and it is in process and  will be sending medication it to pt.

## 2019-04-11 ENCOUNTER — Telehealth: Payer: Self-pay | Admitting: Medical Oncology

## 2019-04-11 NOTE — Telephone Encounter (Signed)
Pt has not heard from [facility] about revlimid delivery.I talked to [facility] and they will ask an insurance rep to call him with update.

## 2019-04-12 ENCOUNTER — Telehealth: Payer: Self-pay | Admitting: Medical Oncology

## 2019-04-12 NOTE — Telephone Encounter (Signed)
ILVM for pt to call [facility] re Revlimid. He has not heard from them re delivery.

## 2019-05-03 ENCOUNTER — Other Ambulatory Visit: Payer: Self-pay | Admitting: Medical Oncology

## 2019-05-03 ENCOUNTER — Ambulatory Visit: Payer: Self-pay | Attending: Family

## 2019-05-03 ENCOUNTER — Telehealth: Payer: Self-pay | Admitting: Medical Oncology

## 2019-05-03 DIAGNOSIS — Z23 Encounter for immunization: Secondary | ICD-10-CM

## 2019-05-03 DIAGNOSIS — C9001 Multiple myeloma in remission: Secondary | ICD-10-CM

## 2019-05-03 MED ORDER — LENALIDOMIDE 10 MG PO CAPS: 10.0000 mg | ORAL_CAPSULE | Freq: Every day | ORAL | 0 refills | Status: DC

## 2019-05-03 NOTE — Progress Notes (Signed)
   Covid-19 Vaccination Clinic  Name:  Matthew Yates    MRN: [id number] DOB: 20-Jan-1951  05/03/2019  Matthew Yates was observed post Covid-19 immunization for 15 minutes without incident. He was provided with Vaccine Information Sheet and instruction to access the V-Safe system.   Matthew Yates was instructed to call 911 with any severe reactions post vaccine: Marland Kitchen Difficulty breathing  . Swelling of face and throat  . A fast heartbeat  . A bad rash all over body  . Dizziness and weakness   Immunizations Administered    Name Date Dose VIS Date Route   Moderna COVID-19 Vaccine 05/03/2019 10:20 AM 0.5 mL 01/04/2019 Intramuscular   Manufacturer: Moderna   [id number]   [id number]

## 2019-05-03 NOTE — Addendum Note (Signed)
Addended by: Ardeen Garland on: 05/03/2019 11:24 AM   Modules accepted: Orders

## 2019-05-03 NOTE — Telephone Encounter (Signed)
I told pt that his Revlimid was sent to [facility] this am.

## 2019-05-17 ENCOUNTER — Telehealth: Payer: Self-pay | Admitting: *Deleted

## 2019-05-17 NOTE — Telephone Encounter (Signed)
Message left for Matthew Yates [id number]).  No disability letter or form received to date.  Provided fax number [phone] to resend.    Voicemail received requesting information. "A letter was sent 05-03-2019 to the office requesting information for my long term disability.  Information is needed by 06-16-2019 or disability will be suspended.  Matthew Yates [phone] ext. [phone] can assist.  Call me to let me know the status."

## 2019-05-19 NOTE — Telephone Encounter (Signed)
Awaiting forms for patient.    05/17/2019 Received return call from Harle Battiest.  Provided fax number for [employer] to fax disability request.

## 2019-05-20 ENCOUNTER — Telehealth: Payer: Self-pay | Admitting: *Deleted

## 2019-05-20 NOTE — Telephone Encounter (Signed)
[facility] calling with record request for Matthew Yates Nov 05, 1950 of Dr. Julien Nordmann.  We need records from January 2020 to present.  Need address and fax number for the office and confirmation correct office for this provider."

## 2019-05-30 ENCOUNTER — Other Ambulatory Visit: Payer: Self-pay | Admitting: Medical Oncology

## 2019-05-31 ENCOUNTER — Other Ambulatory Visit: Payer: Self-pay | Admitting: Medical Oncology

## 2019-05-31 DIAGNOSIS — C9001 Multiple myeloma in remission: Secondary | ICD-10-CM

## 2019-05-31 MED ORDER — LENALIDOMIDE 10 MG PO CAPS: 10.0000 mg | ORAL_CAPSULE | Freq: Every day | ORAL | 0 refills | Status: DC

## 2019-06-30 ENCOUNTER — Other Ambulatory Visit: Payer: Self-pay | Admitting: Internal Medicine

## 2019-06-30 DIAGNOSIS — C9001 Multiple myeloma in remission: Secondary | ICD-10-CM

## 2019-07-05 MED ORDER — LENALIDOMIDE 10 MG PO CAPS: ORAL_CAPSULE | ORAL | 0 refills | Status: DC

## 2019-07-05 NOTE — Telephone Encounter (Signed)
Called in [place] number for revlimid.

## 2019-07-05 NOTE — Addendum Note (Signed)
Addended by: Ardeen Garland on: 07/05/2019 12:04 PM   Modules accepted: Orders

## 2019-07-22 ENCOUNTER — Telehealth: Payer: Self-pay | Admitting: Medical Oncology

## 2019-07-22 NOTE — Telephone Encounter (Signed)
We need to see him back next week if we have any availability with repeat CBC, comprehensive metabolic panel and LDH. Thank you.

## 2019-07-22 NOTE — Telephone Encounter (Signed)
°  Loose stools x twice a day with cramping  for 3 weeks. Pepto bismol not effective .  I instructed pt to get OTC imodium AD 2 tabs after first stool of day and one tablet after each successive stool.  He states he is not gaining weight-still  240 lbs.  I instructed him to contact [facility] with above symptoms too. He goes back to [facility] in [place].

## 2019-07-26 ENCOUNTER — Telehealth: Payer: Self-pay | Admitting: Internal Medicine

## 2019-07-26 NOTE — Telephone Encounter (Signed)
Scheduled appt per 6/21 sch msg - pt is aware of appt date and time

## 2019-07-30 ENCOUNTER — Other Ambulatory Visit: Payer: Self-pay | Admitting: Internal Medicine

## 2019-07-30 DIAGNOSIS — C9001 Multiple myeloma in remission: Secondary | ICD-10-CM

## 2019-08-03 ENCOUNTER — Other Ambulatory Visit: Payer: Self-pay

## 2019-08-03 ENCOUNTER — Inpatient Hospital Stay: Payer: BC Managed Care – PPO | Attending: Internal Medicine | Admitting: Internal Medicine

## 2019-08-03 ENCOUNTER — Inpatient Hospital Stay: Payer: BC Managed Care – PPO

## 2019-08-03 ENCOUNTER — Encounter: Payer: Self-pay | Admitting: Internal Medicine

## 2019-08-03 VITALS — BP 125/78 | HR 85 | Temp 97.7°F | Resp 18 | Ht 75.0 in | Wt 244.7 lb

## 2019-08-03 DIAGNOSIS — C9000 Multiple myeloma not having achieved remission: Secondary | ICD-10-CM

## 2019-08-03 DIAGNOSIS — Z7901 Long term (current) use of anticoagulants: Secondary | ICD-10-CM | POA: Diagnosis not present

## 2019-08-03 DIAGNOSIS — I10 Essential (primary) hypertension: Secondary | ICD-10-CM | POA: Insufficient documentation

## 2019-08-03 DIAGNOSIS — K589 Irritable bowel syndrome without diarrhea: Secondary | ICD-10-CM | POA: Insufficient documentation

## 2019-08-03 DIAGNOSIS — Z791 Long term (current) use of non-steroidal anti-inflammatories (NSAID): Secondary | ICD-10-CM | POA: Insufficient documentation

## 2019-08-03 DIAGNOSIS — Z5111 Encounter for antineoplastic chemotherapy: Secondary | ICD-10-CM | POA: Diagnosis not present

## 2019-08-03 DIAGNOSIS — F419 Anxiety disorder, unspecified: Secondary | ICD-10-CM

## 2019-08-03 DIAGNOSIS — G629 Polyneuropathy, unspecified: Secondary | ICD-10-CM | POA: Diagnosis not present

## 2019-08-03 DIAGNOSIS — K219 Gastro-esophageal reflux disease without esophagitis: Secondary | ICD-10-CM | POA: Diagnosis not present

## 2019-08-03 DIAGNOSIS — Z79899 Other long term (current) drug therapy: Secondary | ICD-10-CM | POA: Diagnosis not present

## 2019-08-03 LAB — CBC WITH DIFFERENTIAL (CANCER CENTER ONLY)
Abs Immature Granulocytes: 0.01 10*3/uL (ref 0.00–0.07)
Basophils Absolute: 0.1 10*3/uL (ref 0.0–0.1)
Basophils Relative: 2 %
Eosinophils Absolute: 0.5 10*3/uL (ref 0.0–0.5)
Eosinophils Relative: 13 %
HCT: 41.6 % (ref 39.0–52.0)
Hemoglobin: 14 g/dL (ref 13.0–17.0)
Immature Granulocytes: 0 %
Lymphocytes Relative: 43 %
Lymphs Abs: 1.8 10*3/uL (ref 0.7–4.0)
MCH: 32.3 pg (ref 26.0–34.0)
MCHC: 33.7 g/dL (ref 30.0–36.0)
MCV: 95.9 fL (ref 80.0–100.0)
Monocytes Absolute: 0.5 10*3/uL (ref 0.1–1.0)
Monocytes Relative: 12 %
Neutro Abs: 1.3 10*3/uL — ABNORMAL LOW (ref 1.7–7.7)
Neutrophils Relative %: 30 %
Platelet Count: 194 10*3/uL (ref 150–400)
RBC: 4.34 MIL/uL (ref 4.22–5.81)
RDW: 14.5 % (ref 11.5–15.5)
WBC Count: 4.2 10*3/uL (ref 4.0–10.5)
nRBC: 0 % (ref 0.0–0.2)

## 2019-08-03 LAB — CMP (CANCER CENTER ONLY)
ALT: 20 U/L (ref 0–44)
AST: 19 U/L (ref 15–41)
Albumin: 3.6 g/dL (ref 3.5–5.0)
Alkaline Phosphatase: 116 U/L (ref 38–126)
Anion gap: 11 (ref 5–15)
BUN: 8 mg/dL (ref 8–23)
CO2: 25 mmol/L (ref 22–32)
Calcium: 9.3 mg/dL (ref 8.9–10.3)
Chloride: 107 mmol/L (ref 98–111)
Creatinine: 1.25 mg/dL — ABNORMAL HIGH (ref 0.61–1.24)
GFR, Est AFR Am: >60 mL/min (ref >60)
GFR, Estimated: 60 mL/min — ABNORMAL LOW (ref >60)
Glucose, Bld: 101 mg/dL — ABNORMAL HIGH (ref 70–99)
Potassium: 3.7 mmol/L (ref 3.5–5.1)
Sodium: 143 mmol/L (ref 135–145)
Total Bilirubin: 0.7 mg/dL (ref 0.3–1.2)
Total Protein: 7.7 g/dL (ref 6.5–8.1)

## 2019-08-03 LAB — LACTATE DEHYDROGENASE: LDH: 138 U/L (ref 98–192)

## 2019-08-03 MED ORDER — LENALIDOMIDE 10 MG PO CAPS: ORAL_CAPSULE | ORAL | 0 refills | Status: DC

## 2019-08-03 NOTE — Addendum Note (Signed)
Addended by: Ardeen Garland on: 08/03/2019 11:03 AM   Modules accepted: Orders

## 2019-08-03 NOTE — Progress Notes (Signed)
[place] [phone]   Fax:(336) [phone]  OFFICE PROGRESS NOTE  Derinda Late, MD [phone] [address] [facility] [place] [address]  DIAGNOSIS: Multiple myeloma, IgG subtype diagnosed in January 2020.  PRIOR THERAPY:  1) Systemic chemotherapy with Velcade 1.3 mg/M2 on days 1, 8, 15 as well as Revlimid 25 mg p.o. daily for 21 days every 4 weeks as well as Decadron 40 mg weekly.  First dose expected March 22, 2018.  Status post 6 cycles. 2) autologous stem cell transplant at [facility] on 10/08/2018.  CURRENT THERAPY:  Maintenance Revlimid 10 mg p.o. daily.   INTERVAL HISTORY: Matthew Yates 69 y.o. male returns to the clinic today for follow-up visit.  The patient is feeling fine today with no concerning complaints except for alternating diarrhea and constipation as well as gas formation.  He was given prescription for Carafate by [facility] which did help some.  He is also on Nexium.  He denied having any chest pain, shortness of breath, cough or hemoptysis.  He denied having any fever or chills.  He lost few pounds since his last visit.  The patient continues to tolerate his treatment with maintenance Revlimid fairly well.  He is here today for evaluation and repeat blood work.   MEDICAL HISTORY: Past Medical History:  Diagnosis Date  . Acute medial meniscus tear of left knee   . Arthritis    lt knee  . GERD (gastroesophageal reflux disease)   . Hypertension     ALLERGIES:  is allergic to other.  MEDICATIONS:  Current Outpatient Medications  Medication Sig Dispense Refill  . ALPRAZolam (XANAX) 0.25 MG tablet Take 1 tablet (0.25 mg total) by mouth at bedtime as needed for anxiety. 30 tablet 0  . amLODipine (NORVASC) 5 MG tablet Take by mouth.    . clonazePAM (KLONOPIN) 0.5 MG tablet Take 1 tablet by mouth 2 (two) times daily as needed.    Marland Kitchen esomeprazole (NEXIUM) 20 MG capsule Take 1  capsule (20 mg total) by mouth daily before breakfast. 30 capsule 1  . gabapentin (NEURONTIN) 100 MG capsule TAKE 2 CAPSULES (200 MG TOTAL) BY MOUTH 3 (THREE) TIMES DAILY. 180 capsule 1  . HYDROcodone-acetaminophen (NORCO) 7.5-325 MG tablet Take 1-2 tablets by mouth every 6 (six) hours as needed for moderate pain. (Patient not taking: Reported on 11/03/2018) 90 tablet 0  . ibuprofen (ADVIL,MOTRIN) 400 MG tablet Take 400 mg by mouth every 6 (six) hours as needed.    Marland Kitchen lenalidomide (REVLIMID) 10 MG capsule TAKE 1 CAPSULE DAILY, CONTINUOUSLY 28 capsule 0  . loratadine-pseudoephedrine (CLARITIN-D 12-HOUR) 5-120 MG tablet Take 1 tablet by mouth 2 (two) times daily.    Marland Kitchen LORazepam (ATIVAN) 0.5 MG tablet Take by mouth.    . ondansetron (ZOFRAN) 4 MG tablet Take 1-2 tablets (4-8 mg total) by mouth every 8 (eight) hours as needed for nausea or vomiting. (Patient not taking: Reported on 01/03/2019) 40 tablet 0  . prochlorperazine (COMPAZINE) 10 MG tablet Take 1 tablet (10 mg total) by mouth every 6 (six) hours as needed for nausea or vomiting. (Patient not taking: Reported on 01/03/2019) 30 tablet 0  . senna-docusate (SENOKOT S) 8.6-50 MG tablet Take 1 tablet by mouth at bedtime as needed. (Patient not taking: Reported on 01/03/2019) 30 tablet 1  . traZODone (DESYREL) 50 MG tablet Take 1 tablet by mouth at bedtime as needed.    Marland Kitchen  valACYclovir (VALTREX) 500 MG tablet Take 500 mg by mouth daily.    . vitamin B-12 (CYANOCOBALAMIN) 1000 MCG tablet Take by mouth.    . warfarin (COUMADIN) 2 MG tablet TAKE 1 TABLET BY MOUTH EVERY DAY (Patient not taking: Reported on 04/04/2019) 90 tablet 1   No current facility-administered medications for this visit.    SURGICAL HISTORY:  Past Surgical History:  Procedure Laterality Date  . CHONDROPLASTY Left 07/09/2016   Procedure: CHONDROPLASTY with MICROFRACTURE;  Surgeon: Leandrew Koyanagi, MD;  Location: [place];  Service: Orthopedics;  Laterality: Left;  .  COLONOSCOPY     x2  . HIP SURGERY     aspiration only, no anesthesia  . KNEE ARTHROSCOPY WITH MEDIAL MENISECTOMY Left 07/09/2016   Procedure: LEFT KNEE ARTHROSCOPY WITH PARTIAL MEDIAL MENISCECTOMY;  Surgeon: Leandrew Koyanagi, MD;  Location: [place];  Service: Orthopedics;  Laterality: Left;    REVIEW OF SYSTEMS:  A comprehensive review of systems was negative except for: Gastrointestinal: positive for constipation and diarrhea   PHYSICAL EXAMINATION: General appearance: alert, cooperative and no distress Head: Normocephalic, without obvious abnormality, atraumatic Neck: no adenopathy, no JVD, supple, symmetrical, trachea midline and thyroid not enlarged, symmetric, no tenderness/mass/nodules Lymph nodes: Cervical, supraclavicular, and axillary nodes normal. Resp: clear to auscultation bilaterally Back: symmetric, no curvature. ROM normal. No CVA tenderness. Cardio: regular rate and rhythm, S1, S2 normal, no murmur, click, rub or gallop GI: soft, non-tender; bowel sounds normal; no masses,  no organomegaly Extremities: extremities normal, atraumatic, no cyanosis or edema  ECOG PERFORMANCE STATUS: 1 - Symptomatic but completely ambulatory  Blood pressure 125/78, pulse 85, temperature 97.7 F (36.5 C), temperature source Temporal, resp. rate 18, height 6' 3"  (1.905 m), weight 244 lb 11.2 oz (111 kg), SpO2 100 %.  LABORATORY DATA: Lab Results  Component Value Date   WBC 4.2 08/03/2019   HGB 14.0 08/03/2019   HCT 41.6 08/03/2019   MCV 95.9 08/03/2019   PLT 194 08/03/2019      Chemistry      Component Value Date/Time   NA 142 03/28/2019 0837   K 3.9 03/28/2019 0837   CL 106 03/28/2019 0837   CO2 27 03/28/2019 0837   BUN 14 03/28/2019 0837   CREATININE 1.06 03/28/2019 0837      Component Value Date/Time   CALCIUM 9.4 03/28/2019 0837   ALKPHOS 90 03/28/2019 0837   AST 13 (L) 03/28/2019 0837   ALT 14 03/28/2019 0837   BILITOT 0.4 03/28/2019 0837        RADIOGRAPHIC STUDIES: No results found.  ASSESSMENT AND PLAN: This is a very pleasant 69 years old African-American male recently diagnosed with multiple myeloma, IgG subtype.  He is currently undergoing treatment with Revlimid, Velcade and Decadron status post 6 cycles. The patient continues to tolerate this treatment well with no concerning adverse effect except for mild peripheral neuropathy. The patient has very good response to this treatment. On October 08, 2018 he underwent autologous stem cell transplant at [facility] and tolerated the procedure well  The patient is currently on maintenance treatment with Revlimid 10 mg p.o. daily and has been tolerating this treatment well with no concerning adverse effects. For the irritable bowel syndrome, he was advised to use Gas-X on as-needed basis and avoid spicy and high-protein diet. For the peripheral neuropathy he will continue his current treatment with gabapentin. I will see the patient back for follow-up visit in 4 weeks for evaluation and  repeat blood work. He was advised to call immediately if he has any concerning symptoms in the interval. The patient voices understanding of current disease status and treatment options and is in agreement with the current care plan. All questions were answered. The patient knows to call the clinic with any problems, questions or concerns. We can certainly see the patient much sooner if necessary.    Disclaimer: This note was dictated with voice recognition software. Similar sounding words can inadvertently be transcribed and may not be corrected upon review.

## 2019-08-05 ENCOUNTER — Telehealth: Payer: Self-pay | Admitting: Internal Medicine

## 2019-08-05 NOTE — Telephone Encounter (Signed)
Scheduled per los. Called and spoke with patient. Confirmed appt 

## 2019-08-28 ENCOUNTER — Other Ambulatory Visit: Payer: Self-pay | Admitting: Internal Medicine

## 2019-08-28 DIAGNOSIS — C9001 Multiple myeloma in remission: Secondary | ICD-10-CM

## 2019-08-30 ENCOUNTER — Other Ambulatory Visit: Payer: Self-pay | Admitting: *Deleted

## 2019-08-30 DIAGNOSIS — C9001 Multiple myeloma in remission: Secondary | ICD-10-CM

## 2019-08-30 MED ORDER — LENALIDOMIDE 10 MG PO CAPS: ORAL_CAPSULE | ORAL | 0 refills | Status: DC

## 2019-08-30 NOTE — Progress Notes (Signed)
[employer] Authorization obtained and resubmitted.

## 2019-08-31 ENCOUNTER — Inpatient Hospital Stay: Payer: BC Managed Care – PPO | Attending: Internal Medicine | Admitting: Internal Medicine

## 2019-08-31 ENCOUNTER — Other Ambulatory Visit: Payer: Self-pay

## 2019-08-31 ENCOUNTER — Encounter: Payer: Self-pay | Admitting: Internal Medicine

## 2019-08-31 ENCOUNTER — Inpatient Hospital Stay: Payer: BC Managed Care – PPO

## 2019-08-31 VITALS — BP 129/73 | HR 83 | Temp 98.1°F | Resp 18 | Ht 75.0 in | Wt 246.9 lb

## 2019-08-31 DIAGNOSIS — C9001 Multiple myeloma in remission: Secondary | ICD-10-CM | POA: Diagnosis not present

## 2019-08-31 DIAGNOSIS — F419 Anxiety disorder, unspecified: Secondary | ICD-10-CM | POA: Diagnosis not present

## 2019-08-31 DIAGNOSIS — Z5111 Encounter for antineoplastic chemotherapy: Secondary | ICD-10-CM

## 2019-08-31 DIAGNOSIS — C9000 Multiple myeloma not having achieved remission: Secondary | ICD-10-CM | POA: Insufficient documentation

## 2019-08-31 DIAGNOSIS — G629 Polyneuropathy, unspecified: Secondary | ICD-10-CM | POA: Diagnosis not present

## 2019-08-31 DIAGNOSIS — Z9484 Stem cells transplant status: Secondary | ICD-10-CM | POA: Insufficient documentation

## 2019-08-31 DIAGNOSIS — Z79899 Other long term (current) drug therapy: Secondary | ICD-10-CM | POA: Diagnosis not present

## 2019-08-31 LAB — CBC WITH DIFFERENTIAL (CANCER CENTER ONLY)
Abs Immature Granulocytes: 0.01 10*3/uL (ref 0.00–0.07)
Basophils Absolute: 0.1 10*3/uL (ref 0.0–0.1)
Basophils Relative: 2 %
Eosinophils Absolute: 0.5 10*3/uL (ref 0.0–0.5)
Eosinophils Relative: 10 %
HCT: 40.4 % (ref 39.0–52.0)
Hemoglobin: 13.5 g/dL (ref 13.0–17.0)
Immature Granulocytes: 0 %
Lymphocytes Relative: 42 %
Lymphs Abs: 2 10*3/uL (ref 0.7–4.0)
MCH: 32.3 pg (ref 26.0–34.0)
MCHC: 33.4 g/dL (ref 30.0–36.0)
MCV: 96.7 fL (ref 80.0–100.0)
Monocytes Absolute: 0.5 10*3/uL (ref 0.1–1.0)
Monocytes Relative: 11 %
Neutro Abs: 1.7 10*3/uL (ref 1.7–7.7)
Neutrophils Relative %: 35 %
Platelet Count: 191 10*3/uL (ref 150–400)
RBC: 4.18 MIL/uL — ABNORMAL LOW (ref 4.22–5.81)
RDW: 15.3 % (ref 11.5–15.5)
WBC Count: 4.7 10*3/uL (ref 4.0–10.5)
nRBC: 0 % (ref 0.0–0.2)

## 2019-08-31 LAB — CMP (CANCER CENTER ONLY)
ALT: 16 U/L (ref 0–44)
AST: 15 U/L (ref 15–41)
Albumin: 3.7 g/dL (ref 3.5–5.0)
Alkaline Phosphatase: 101 U/L (ref 38–126)
Anion gap: 8 (ref 5–15)
BUN: 11 mg/dL (ref 8–23)
CO2: 27 mmol/L (ref 22–32)
Calcium: 9.9 mg/dL (ref 8.9–10.3)
Chloride: 104 mmol/L (ref 98–111)
Creatinine: 1.25 mg/dL — ABNORMAL HIGH (ref 0.61–1.24)
GFR, Est AFR Am: >60 mL/min (ref >60)
GFR, Estimated: 60 mL/min — ABNORMAL LOW (ref >60)
Glucose, Bld: 98 mg/dL (ref 70–99)
Potassium: 3.7 mmol/L (ref 3.5–5.1)
Sodium: 139 mmol/L (ref 135–145)
Total Bilirubin: 0.6 mg/dL (ref 0.3–1.2)
Total Protein: 7.8 g/dL (ref 6.5–8.1)

## 2019-08-31 LAB — LACTATE DEHYDROGENASE: LDH: 145 U/L (ref 98–192)

## 2019-08-31 MED ORDER — GABAPENTIN 100 MG PO CAPS: 200.0000 mg | ORAL_CAPSULE | Freq: Three times a day (TID) | ORAL | 1 refills | Status: DC

## 2019-08-31 NOTE — Progress Notes (Signed)
[place] Telephone:([phone]   Fax:(336) [phone]  OFFICE PROGRESS NOTE  Derinda Late, MD [phone] [facility] [place] [address]  DIAGNOSIS: Multiple myeloma, IgG subtype diagnosed in January 2020.  PRIOR THERAPY:  1) Systemic chemotherapy with Velcade 1.3 mg/M2 on days 1, 8, 15 as well as Revlimid 25 mg p.o. daily for 21 days every 4 weeks as well as Decadron 40 mg weekly.  First dose expected March 22, 2018.  Status post 6 cycles. 2) autologous stem cell transplant at [facility] on 10/08/2018.  CURRENT THERAPY:  Maintenance Revlimid 10 mg p.o. daily.  Status post 3 months of treatment.  INTERVAL HISTORY: Matthew Yates 69 y.o. male returns to the clinic today for follow-up visit.  The patient is feeling fine today with no concerning complaints except for the persistent peripheral neuropathy in the toes.  He has been on treatment with gabapentin but he did not renew his prescription and has been off treatment for the last months.  He denied having any current chest pain, shortness of breath, cough or hemoptysis.  He denied having any fever or chills.  He has no nausea, vomiting, but has occasional diarrhea and no constipation.  He has no headache or visual changes.  He continues to tolerate his treatment with Revlimid fairly well.  The patient is here today for evaluation and repeat blood work.    MEDICAL HISTORY: Past Medical History:  Diagnosis Date  . Acute medial meniscus tear of left knee   . Arthritis    lt knee  . GERD (gastroesophageal reflux disease)   . Hypertension     ALLERGIES:  is allergic to other.  MEDICATIONS:  Current Outpatient Medications  Medication Sig Dispense Refill  . ALPRAZolam (XANAX) 0.25 MG tablet Take 1 tablet (0.25 mg total) by mouth at bedtime as needed for anxiety. 30 tablet 0  . clonazePAM (KLONOPIN) 0.5 MG tablet Take 1 tablet by mouth 2 (two) times daily  as needed.    . gabapentin (NEURONTIN) 100 MG capsule TAKE 2 CAPSULES (200 MG TOTAL) BY MOUTH 3 (THREE) TIMES DAILY. 180 capsule 1  . HYDROcodone-acetaminophen (NORCO) 7.5-325 MG tablet Take 1-2 tablets by mouth every 6 (six) hours as needed for moderate pain. 90 tablet 0  . ibuprofen (ADVIL,MOTRIN) 400 MG tablet Take 400 mg by mouth every 6 (six) hours as needed.    Marland Kitchen lenalidomide (REVLIMID) 10 MG capsule TAKE 1 CAPSULE DAILY, CONTINUOUSLY 28 capsule 0  . loratadine-pseudoephedrine (CLARITIN-D 12-HOUR) 5-120 MG tablet Take 1 tablet by mouth 2 (two) times daily.    Marland Kitchen LORazepam (ATIVAN) 0.5 MG tablet Take by mouth.    . ondansetron (ZOFRAN) 4 MG tablet Take 1-2 tablets (4-8 mg total) by mouth every 8 (eight) hours as needed for nausea or vomiting. 40 tablet 0  . prochlorperazine (COMPAZINE) 10 MG tablet Take 1 tablet (10 mg total) by mouth every 6 (six) hours as needed for nausea or vomiting. 30 tablet 0  . senna-docusate (SENOKOT S) 8.6-50 MG tablet Take 1 tablet by mouth at bedtime as needed. 30 tablet 1  . traZODone (DESYREL) 50 MG tablet Take 1 tablet by mouth at bedtime as needed.    . valACYclovir (VALTREX) 500 MG tablet Take 500 mg by mouth daily.    . vitamin B-12 (CYANOCOBALAMIN) 1000 MCG tablet Take by mouth.    . warfarin (COUMADIN) 2 MG tablet TAKE 1 TABLET  BY MOUTH EVERY DAY 90 tablet 1   No current facility-administered medications for this visit.    SURGICAL HISTORY:  Past Surgical History:  Procedure Laterality Date  . CHONDROPLASTY Left 07/09/2016   Procedure: CHONDROPLASTY with MICROFRACTURE;  Surgeon: Leandrew Koyanagi, MD;  Location: [place];  Service: Orthopedics;  Laterality: Left;  . COLONOSCOPY     x2  . HIP SURGERY     aspiration only, no anesthesia  . KNEE ARTHROSCOPY WITH MEDIAL MENISECTOMY Left 07/09/2016   Procedure: LEFT KNEE ARTHROSCOPY WITH PARTIAL MEDIAL MENISCECTOMY;  Surgeon: Leandrew Koyanagi, MD;  Location: [place];  Service:  Orthopedics;  Laterality: Left;    REVIEW OF SYSTEMS:  A comprehensive review of systems was negative except for: Gastrointestinal: positive for diarrhea Neurological: positive for paresthesia   PHYSICAL EXAMINATION: General appearance: alert, cooperative and no distress Head: Normocephalic, without obvious abnormality, atraumatic Neck: no adenopathy, no JVD, supple, symmetrical, trachea midline and thyroid not enlarged, symmetric, no tenderness/mass/nodules Lymph nodes: Cervical, supraclavicular, and axillary nodes normal. Resp: clear to auscultation bilaterally Back: symmetric, no curvature. ROM normal. No CVA tenderness. Cardio: regular rate and rhythm, S1, S2 normal, no murmur, click, rub or gallop GI: soft, non-tender; bowel sounds normal; no masses,  no organomegaly Extremities: extremities normal, atraumatic, no cyanosis or edema  ECOG PERFORMANCE STATUS: 1 - Symptomatic but completely ambulatory  Blood pressure (!) 129/73, pulse 83, temperature 98.1 F (36.7 C), temperature source Temporal, resp. rate 18, height '6\' 3"'$  (1.905 m), weight (!) 246 lb 14.4 oz (112 kg), SpO2 98 %.  LABORATORY DATA: Lab Results  Component Value Date   WBC 4.7 08/31/2019   HGB 13.5 08/31/2019   HCT 40.4 08/31/2019   MCV 96.7 08/31/2019   PLT 191 08/31/2019      Chemistry      Component Value Date/Time   NA 139 08/31/2019 0918   K 3.7 08/31/2019 0918   CL 104 08/31/2019 0918   CO2 27 08/31/2019 0918   BUN 11 08/31/2019 0918   CREATININE 1.25 (H) 08/31/2019 0918      Component Value Date/Time   CALCIUM 9.9 08/31/2019 0918   ALKPHOS 101 08/31/2019 0918   AST 15 08/31/2019 0918   ALT 16 08/31/2019 0918   BILITOT 0.6 08/31/2019 0918       RADIOGRAPHIC STUDIES: No results found.  ASSESSMENT AND PLAN: This is a very pleasant 69 years old African-American male recently diagnosed with multiple myeloma, IgG subtype.  He is currently undergoing treatment with Revlimid, Velcade and Decadron  status post 6 cycles. The patient continues to tolerate this treatment well with no concerning adverse effect except for mild peripheral neuropathy. The patient has very good response to this treatment. On October 08, 2018 he underwent autologous stem cell transplant at [facility] and tolerated the procedure well  The patient is currently on maintenance treatment with Revlimid 10 mg p.o. daily status post 3 months of treatment. The patient continues to tolerate this treatment well with no concerning adverse effect except for occasional diarrhea. I recommended for him to continue his current treatment with Revlimid 10 mg p.o. daily. For the diarrhea, the patient will continue was Imodium on as-needed basis. For the peripheral neuropathy I will give him refill of gabapentin. I will see him back for follow-up visit in 1 months for evaluation with repeat myeloma panel. The patient was advised to call immediately if he has any concerning symptoms in the interval. The patient voices  understanding of current disease status and treatment options and is in agreement with the current care plan. All questions were answered. The patient knows to call the clinic with any problems, questions or concerns. We can certainly see the patient much sooner if necessary.    Disclaimer: This note was dictated with voice recognition software. Similar sounding words can inadvertently be transcribed and may not be corrected upon review.

## 2019-09-01 ENCOUNTER — Telehealth: Payer: Self-pay | Admitting: Internal Medicine

## 2019-09-01 NOTE — Telephone Encounter (Signed)
Scheduled per los. Called and spoke with patient. Confirmed appt 

## 2019-09-21 ENCOUNTER — Other Ambulatory Visit: Payer: Self-pay

## 2019-09-21 ENCOUNTER — Inpatient Hospital Stay: Payer: BC Managed Care – PPO | Attending: Internal Medicine

## 2019-09-21 DIAGNOSIS — G629 Polyneuropathy, unspecified: Secondary | ICD-10-CM | POA: Insufficient documentation

## 2019-09-21 DIAGNOSIS — R197 Diarrhea, unspecified: Secondary | ICD-10-CM | POA: Diagnosis not present

## 2019-09-21 DIAGNOSIS — Z9484 Stem cells transplant status: Secondary | ICD-10-CM | POA: Insufficient documentation

## 2019-09-21 DIAGNOSIS — C9001 Multiple myeloma in remission: Secondary | ICD-10-CM

## 2019-09-21 DIAGNOSIS — C9000 Multiple myeloma not having achieved remission: Secondary | ICD-10-CM | POA: Insufficient documentation

## 2019-09-21 DIAGNOSIS — Z79899 Other long term (current) drug therapy: Secondary | ICD-10-CM | POA: Diagnosis not present

## 2019-09-21 LAB — CMP (CANCER CENTER ONLY)
ALT: 18 U/L (ref 0–44)
AST: 16 U/L (ref 15–41)
Albumin: 3.4 g/dL — ABNORMAL LOW (ref 3.5–5.0)
Alkaline Phosphatase: 105 U/L (ref 38–126)
Anion gap: 6 (ref 5–15)
BUN: 11 mg/dL (ref 8–23)
CO2: 28 mmol/L (ref 22–32)
Calcium: 9.6 mg/dL (ref 8.9–10.3)
Chloride: 106 mmol/L (ref 98–111)
Creatinine: 1.25 mg/dL — ABNORMAL HIGH (ref 0.61–1.24)
GFR, Est AFR Am: >60 mL/min (ref >60)
GFR, Estimated: 60 mL/min — ABNORMAL LOW (ref >60)
Glucose, Bld: 96 mg/dL (ref 70–99)
Potassium: 3.6 mmol/L (ref 3.5–5.1)
Sodium: 140 mmol/L (ref 135–145)
Total Bilirubin: 0.6 mg/dL (ref 0.3–1.2)
Total Protein: 7.3 g/dL (ref 6.5–8.1)

## 2019-09-21 LAB — CBC WITH DIFFERENTIAL (CANCER CENTER ONLY)
Abs Immature Granulocytes: 0 10*3/uL (ref 0.00–0.07)
Basophils Absolute: 0.1 10*3/uL (ref 0.0–0.1)
Basophils Relative: 3 %
Eosinophils Absolute: 0.5 10*3/uL (ref 0.0–0.5)
Eosinophils Relative: 13 %
HCT: 40.3 % (ref 39.0–52.0)
Hemoglobin: 13.4 g/dL (ref 13.0–17.0)
Immature Granulocytes: 0 %
Lymphocytes Relative: 44 %
Lymphs Abs: 1.7 10*3/uL (ref 0.7–4.0)
MCH: 32.4 pg (ref 26.0–34.0)
MCHC: 33.3 g/dL (ref 30.0–36.0)
MCV: 97.6 fL (ref 80.0–100.0)
Monocytes Absolute: 0.4 10*3/uL (ref 0.1–1.0)
Monocytes Relative: 12 %
Neutro Abs: 1.1 10*3/uL — ABNORMAL LOW (ref 1.7–7.7)
Neutrophils Relative %: 28 %
Platelet Count: 187 10*3/uL (ref 150–400)
RBC: 4.13 MIL/uL — ABNORMAL LOW (ref 4.22–5.81)
RDW: 15.1 % (ref 11.5–15.5)
WBC Count: 3.8 10*3/uL — ABNORMAL LOW (ref 4.0–10.5)
nRBC: 0 % (ref 0.0–0.2)

## 2019-09-21 LAB — LACTATE DEHYDROGENASE: LDH: 123 U/L (ref 98–192)

## 2019-09-22 LAB — KAPPA/LAMBDA LIGHT CHAINS
Kappa free light chain: 102.2 mg/L — ABNORMAL HIGH (ref 3.3–19.4)
Kappa, lambda light chain ratio: 3.6 — ABNORMAL HIGH (ref 0.26–1.65)
Lambda free light chains: 28.4 mg/L — ABNORMAL HIGH (ref 5.7–26.3)

## 2019-09-22 LAB — IGG, IGA, IGM
IgA: 209 mg/dL (ref 61–437)
IgG (Immunoglobin G), Serum: 1748 mg/dL — ABNORMAL HIGH (ref 603–1613)
IgM (Immunoglobulin M), Srm: 27 mg/dL (ref 20–172)

## 2019-09-22 LAB — BETA 2 MICROGLOBULIN, SERUM: Beta-2 Microglobulin: 1.9 mg/L (ref 0.6–2.4)

## 2019-09-23 ENCOUNTER — Other Ambulatory Visit: Payer: Self-pay | Admitting: Internal Medicine

## 2019-09-23 DIAGNOSIS — C9001 Multiple myeloma in remission: Secondary | ICD-10-CM

## 2019-09-26 ENCOUNTER — Other Ambulatory Visit: Payer: Self-pay | Admitting: Medical Oncology

## 2019-09-26 DIAGNOSIS — C9001 Multiple myeloma in remission: Secondary | ICD-10-CM

## 2019-09-26 MED ORDER — LENALIDOMIDE 10 MG PO CAPS: ORAL_CAPSULE | ORAL | 0 refills | Status: DC

## 2019-09-28 ENCOUNTER — Other Ambulatory Visit: Payer: Self-pay | Admitting: Medical Oncology

## 2019-09-28 ENCOUNTER — Encounter: Payer: Self-pay | Admitting: Internal Medicine

## 2019-09-28 ENCOUNTER — Other Ambulatory Visit: Payer: Self-pay

## 2019-09-28 ENCOUNTER — Inpatient Hospital Stay (HOSPITAL_BASED_OUTPATIENT_CLINIC_OR_DEPARTMENT_OTHER): Payer: BC Managed Care – PPO | Admitting: Internal Medicine

## 2019-09-28 ENCOUNTER — Other Ambulatory Visit: Payer: Self-pay | Admitting: Internal Medicine

## 2019-09-28 VITALS — BP 129/71 | HR 83 | Temp 98.7°F | Resp 18 | Ht 75.0 in | Wt 250.0 lb

## 2019-09-28 DIAGNOSIS — C9001 Multiple myeloma in remission: Secondary | ICD-10-CM

## 2019-09-28 DIAGNOSIS — Z5111 Encounter for antineoplastic chemotherapy: Secondary | ICD-10-CM

## 2019-09-28 DIAGNOSIS — C9000 Multiple myeloma not having achieved remission: Secondary | ICD-10-CM

## 2019-09-28 DIAGNOSIS — F419 Anxiety disorder, unspecified: Secondary | ICD-10-CM

## 2019-09-28 DIAGNOSIS — G620 Drug-induced polyneuropathy: Secondary | ICD-10-CM

## 2019-09-28 MED ORDER — LENALIDOMIDE 10 MG PO CAPS: ORAL_CAPSULE | ORAL | 0 refills | Status: DC

## 2019-09-28 MED ORDER — GABAPENTIN 300 MG PO CAPS: 300.0000 mg | ORAL_CAPSULE | Freq: Three times a day (TID) | ORAL | 2 refills | Status: DC

## 2019-09-28 NOTE — Progress Notes (Signed)
[place] [phone]   [phone]  OFFICE PROGRESS NOTE  Derinda Late, MD [phone] [address] [facility] [place] [address]  DIAGNOSIS: Multiple myeloma, IgG subtype diagnosed in January 2020.  PRIOR THERAPY:  1) Systemic chemotherapy with Velcade 1.3 mg/M2 on days 1, 8, 15 as well as Revlimid 25 mg p.o. daily for 21 days every 4 weeks as well as Decadron 40 mg weekly.  First dose expected March 22, 2018.  Status post 6 cycles. 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  CURRENT THERAPY:  Maintenance Revlimid 10 mg p.o. daily.  Status post 4 months of treatment.  INTERVAL HISTORY: Matthew Yates 69 y.o. male returns to the clinic today for follow-up visit.  The patient is feeling well today with no concerning complaints except for the peripheral neuropathy in the lower extremities.  He is currently on gabapentin 200 mg p.o. 3 times daily but continues to have symptoms from the peripheral neuropathy.  The patient also very anxious about the results of his myeloma panel.  He denied having any current chest pain, shortness of breath, cough or hemoptysis.  He denied having any fever or chills.  He has no nausea, vomiting, diarrhea or constipation.  He has no headache or visual changes.  He had repeat myeloma panel performed recently and is here for evaluation and discussion of his lab results.  He continues to tolerate his treatment with Revlimid fairly well.   MEDICAL HISTORY: Past Medical History:  Diagnosis Date  . Acute medial meniscus tear of left knee   . Arthritis    lt knee  . GERD (gastroesophageal reflux disease)   . Hypertension     ALLERGIES:  is allergic to other.  MEDICATIONS:  Current Outpatient Medications  Medication Sig Dispense Refill  . ALPRAZolam (XANAX) 0.25 MG tablet Take 1 tablet (0.25 mg total) by mouth at bedtime as needed for anxiety. 30 tablet  0  . clonazePAM (KLONOPIN) 0.5 MG tablet Take 1 tablet by mouth 2 (two) times daily as needed.    . gabapentin (NEURONTIN) 100 MG capsule Take 2 capsules (200 mg total) by mouth 3 (three) times daily. 180 capsule 1  . HYDROcodone-acetaminophen (NORCO) 7.5-325 MG tablet Take 1-2 tablets by mouth every 6 (six) hours as needed for moderate pain. 90 tablet 0  . ibuprofen (ADVIL,MOTRIN) 400 MG tablet Take 400 mg by mouth every 6 (six) hours as needed.    Marland Kitchen lenalidomide (REVLIMID) 10 MG capsule TAKE 1 CAPSULE DAILY, CONTINUOUSLY 09/26/19 adult male auth [id number] 28 capsule 0  . loratadine-pseudoephedrine (CLARITIN-D 12-HOUR) 5-120 MG tablet Take 1 tablet by mouth 2 (two) times daily.    Marland Kitchen LORazepam (ATIVAN) 0.5 MG tablet Take by mouth.    . ondansetron (ZOFRAN) 4 MG tablet Take 1-2 tablets (4-8 mg total) by mouth every 8 (eight) hours as needed for nausea or vomiting. 40 tablet 0  . prochlorperazine (COMPAZINE) 10 MG tablet Take 1 tablet (10 mg total) by mouth every 6 (six) hours as needed for nausea or vomiting. 30 tablet 0  . senna-docusate (SENOKOT S) 8.6-50 MG tablet Take 1 tablet by mouth at bedtime as needed. 30 tablet 1  . traZODone (DESYREL) 50 MG tablet Take 1 tablet by mouth at bedtime as needed.    . valACYclovir (VALTREX) 500 MG tablet Take 500 mg by mouth daily.    . vitamin B-12 (CYANOCOBALAMIN) 1000  MCG tablet Take by mouth.    . warfarin (COUMADIN) 2 MG tablet TAKE 1 TABLET BY MOUTH EVERY DAY 90 tablet 1   No current facility-administered medications for this visit.    SURGICAL HISTORY:  Past Surgical History:  Procedure Laterality Date  . CHONDROPLASTY Left 07/09/2016   Procedure: CHONDROPLASTY with MICROFRACTURE;  Surgeon: Leandrew Koyanagi, MD;  Location: [facility];  Service: Orthopedics;  Laterality: Left;  . COLONOSCOPY     x2  . HIP SURGERY     aspiration only, no anesthesia  . KNEE ARTHROSCOPY WITH MEDIAL MENISECTOMY Left 07/09/2016   Procedure: LEFT KNEE  ARTHROSCOPY WITH PARTIAL MEDIAL MENISCECTOMY;  Surgeon: Leandrew Koyanagi, MD;  Location: [facility];  Service: Orthopedics;  Laterality: Left;    REVIEW OF SYSTEMS:  Constitutional: positive for fatigue Eyes: negative Ears, nose, mouth, throat, and face: negative Respiratory: negative Cardiovascular: negative Gastrointestinal: negative Genitourinary:negative Integument/breast: negative Hematologic/lymphatic: negative Musculoskeletal:negative Neurological: negative Behavioral/Psych: positive for anxiety Endocrine: negative Allergic/Immunologic: negative   PHYSICAL EXAMINATION: General appearance: alert, cooperative, fatigued and no distress Head: Normocephalic, without obvious abnormality, atraumatic Neck: no adenopathy, no JVD, supple, symmetrical, trachea midline and thyroid not enlarged, symmetric, no tenderness/mass/nodules Lymph nodes: Cervical, supraclavicular, and axillary nodes normal. Resp: clear to auscultation bilaterally Back: symmetric, no curvature. ROM normal. No CVA tenderness. Cardio: regular rate and rhythm, S1, S2 normal, no murmur, click, rub or gallop GI: soft, non-tender; bowel sounds normal; no masses,  no organomegaly Extremities: extremities normal, atraumatic, no cyanosis or edema Neurologic: Alert and oriented X 3, normal strength and tone. Normal symmetric reflexes. Normal coordination and gait  ECOG PERFORMANCE STATUS: 1 - Symptomatic but completely ambulatory  Blood pressure 129/71, pulse 83, temperature 98.7 F (37.1 C), temperature source Tympanic, resp. rate 18, height 6' 3"  (1.905 m), weight 250 lb (113.4 kg), SpO2 100 %.  LABORATORY DATA: Lab Results  Component Value Date   WBC 3.8 (L) 09/21/2019   HGB 13.4 09/21/2019   HCT 40.3 09/21/2019   MCV 97.6 09/21/2019   PLT 187 09/21/2019      Chemistry      Component Value Date/Time   NA 140 09/21/2019 0900   K 3.6 09/21/2019 0900   CL 106 09/21/2019 0900   CO2 28 09/21/2019  0900   BUN 11 09/21/2019 0900   CREATININE 1.25 (H) 09/21/2019 0900      Component Value Date/Time   CALCIUM 9.6 09/21/2019 0900   ALKPHOS 105 09/21/2019 0900   AST 16 09/21/2019 0900   ALT 18 09/21/2019 0900   BILITOT 0.6 09/21/2019 0900       RADIOGRAPHIC STUDIES: No results found.  ASSESSMENT AND PLAN: This is a very pleasant 69 years old African-American male recently diagnosed with multiple myeloma, IgG subtype.  He is currently undergoing treatment with Revlimid, Velcade and Decadron status post 6 cycles. The patient continues to tolerate this treatment well with no concerning adverse effect except for mild peripheral neuropathy. The patient has very good response to this treatment. On October 08, 2018 he underwent autologous stem cell transplant at [facility] and tolerated the procedure well  The patient is currently on maintenance treatment with Revlimid 10 mg p.o. daily status post 4 months of treatment. He continues to tolerate his treatment well except for intermittent diarrhea as well as the peripheral neuropathy. He had repeat myeloma panel performed recently.  I discussed the lab results with the patient today.  His myeloma panel showed significant increase  in the free kappa light chain as well as the IgG. I recommended for the patient to have a bone marrow biopsy and aspirate for further evaluation of his condition and to rule out any disease relapse. I recommended for him to continue his current treatment with Revlimid for now until we have the bone marrow biopsy results. For the diarrhea he will continue on Imodium on as-needed basis. For the peripheral neuropathy, I will increase his dose of gabapentin to 300 mg p.o. 3 times daily. The patient will come back for follow-up visit in 2 weeks for evaluation before the next cycle of his treatment. He was advised to call immediately if he has any concerning symptoms in the interval. The patient voices understanding of  current disease status and treatment options and is in agreement with the current care plan. All questions were answered. The patient knows to call the clinic with any problems, questions or concerns. We can certainly see the patient much sooner if necessary.    Disclaimer: This note was dictated with voice recognition software. Similar sounding words can inadvertently be transcribed and may not be corrected upon review.

## 2019-09-30 ENCOUNTER — Telehealth: Payer: Self-pay | Admitting: Internal Medicine

## 2019-09-30 NOTE — Telephone Encounter (Signed)
Scheduled per los. Called and spoke with patient. Confirmed appt 

## 2019-10-04 ENCOUNTER — Other Ambulatory Visit: Payer: Self-pay | Admitting: Radiology

## 2019-10-06 ENCOUNTER — Other Ambulatory Visit: Payer: Self-pay

## 2019-10-06 ENCOUNTER — Ambulatory Visit (HOSPITAL_COMMUNITY)
Admission: RE | Admit: 2019-10-06 | Discharge: 2019-10-06 | Disposition: A | Discharge status: Home / Self Care | Payer: BC Managed Care – PPO | Source: Ambulatory Visit | Attending: Internal Medicine | Admitting: Internal Medicine

## 2019-10-06 ENCOUNTER — Encounter (HOSPITAL_COMMUNITY): Payer: Self-pay

## 2019-10-06 DIAGNOSIS — M1990 Unspecified osteoarthritis, unspecified site: Secondary | ICD-10-CM | POA: Diagnosis not present

## 2019-10-06 DIAGNOSIS — I10 Essential (primary) hypertension: Secondary | ICD-10-CM | POA: Insufficient documentation

## 2019-10-06 DIAGNOSIS — Z9481 Bone marrow transplant status: Secondary | ICD-10-CM | POA: Insufficient documentation

## 2019-10-06 DIAGNOSIS — C9000 Multiple myeloma not having achieved remission: Secondary | ICD-10-CM | POA: Diagnosis not present

## 2019-10-06 DIAGNOSIS — Z79899 Other long term (current) drug therapy: Secondary | ICD-10-CM | POA: Insufficient documentation

## 2019-10-06 DIAGNOSIS — Z791 Long term (current) use of non-steroidal anti-inflammatories (NSAID): Secondary | ICD-10-CM | POA: Insufficient documentation

## 2019-10-06 DIAGNOSIS — K219 Gastro-esophageal reflux disease without esophagitis: Secondary | ICD-10-CM | POA: Diagnosis not present

## 2019-10-06 DIAGNOSIS — R519 Headache, unspecified: Secondary | ICD-10-CM | POA: Insufficient documentation

## 2019-10-06 LAB — CBC WITH DIFFERENTIAL/PLATELET
Abs Immature Granulocytes: 0.01 10*3/uL (ref 0.00–0.07)
Basophils Absolute: 0.1 10*3/uL (ref 0.0–0.1)
Basophils Relative: 2 %
Eosinophils Absolute: 0.4 10*3/uL (ref 0.0–0.5)
Eosinophils Relative: 10 %
HCT: 40.1 % (ref 39.0–52.0)
Hemoglobin: 13.4 g/dL (ref 13.0–17.0)
Immature Granulocytes: 0 %
Lymphocytes Relative: 43 %
Lymphs Abs: 1.8 10*3/uL (ref 0.7–4.0)
MCH: 33.1 pg (ref 26.0–34.0)
MCHC: 33.4 g/dL (ref 30.0–36.0)
MCV: 99 fL (ref 80.0–100.0)
Monocytes Absolute: 0.6 10*3/uL (ref 0.1–1.0)
Monocytes Relative: 14 %
Neutro Abs: 1.3 10*3/uL — ABNORMAL LOW (ref 1.7–7.7)
Neutrophils Relative %: 31 %
Platelets: 185 10*3/uL (ref 150–400)
RBC: 4.05 MIL/uL — ABNORMAL LOW (ref 4.22–5.81)
RDW: 15.1 % (ref 11.5–15.5)
WBC: 4.2 10*3/uL (ref 4.0–10.5)
nRBC: 0 % (ref 0.0–0.2)

## 2019-10-06 LAB — PROTIME-INR
INR: 1.1 (ref 0.8–1.2)
Prothrombin Time: 13.8 seconds (ref 11.4–15.2)

## 2019-10-06 LAB — GLUCOSE, CAPILLARY: Glucose-Capillary: 105 mg/dL — ABNORMAL HIGH (ref 70–99)

## 2019-10-06 MED ORDER — HYDROMORPHONE HCL 1 MG/ML IJ SOLN
1.0000 mg | Freq: Once | INTRAMUSCULAR | Status: AC
  Administered 2019-10-06: 1 mg via INTRAVENOUS
  Filled 2019-10-06: qty 1

## 2019-10-06 MED ORDER — PROMETHAZINE HCL 25 MG/ML IJ SOLN
12.5000 mg | Freq: Once | INTRAMUSCULAR | Status: AC
  Administered 2019-10-06: 12.5 mg via INTRAVENOUS
  Filled 2019-10-06: qty 1

## 2019-10-06 MED ORDER — MIDAZOLAM HCL 2 MG/2ML IJ SOLN
INTRAMUSCULAR | Status: DC | PRN
  Administered 2019-10-06 (×3): 1 mg via INTRAVENOUS

## 2019-10-06 MED ORDER — ACETAMINOPHEN 325 MG PO TABS
ORAL_TABLET | ORAL | Status: AC
  Administered 2019-10-06: 650 mg
  Filled 2019-10-06: qty 2

## 2019-10-06 MED ORDER — SODIUM CHLORIDE 0.9 % IV SOLN: INTRAVENOUS | Status: DC

## 2019-10-06 MED ORDER — FENTANYL CITRATE (PF) 100 MCG/2ML IJ SOLN
INTRAMUSCULAR | Status: AC
  Filled 2019-10-06: qty 2

## 2019-10-06 MED ORDER — ACETAMINOPHEN 325 MG PO TABS: 650.0000 mg | ORAL_TABLET | Freq: Once | ORAL | Status: DC

## 2019-10-06 MED ORDER — FENTANYL CITRATE (PF) 100 MCG/2ML IJ SOLN
INTRAMUSCULAR | Status: DC | PRN
  Administered 2019-10-06 (×2): 50 ug via INTRAVENOUS

## 2019-10-06 MED ORDER — ONDANSETRON HCL 4 MG/2ML IJ SOLN: 4.0000 mg | Freq: Once | INTRAMUSCULAR | Status: DC

## 2019-10-06 MED ORDER — MIDAZOLAM HCL 2 MG/2ML IJ SOLN
INTRAMUSCULAR | Status: AC
  Filled 2019-10-06: qty 2

## 2019-10-06 MED ORDER — ONDANSETRON HCL 4 MG/2ML IJ SOLN
INTRAMUSCULAR | Status: AC
  Administered 2019-10-06: 4 mg
  Filled 2019-10-06: qty 2

## 2019-10-06 NOTE — H&P (Signed)
Chief Complaint: Patient was seen in consultation today for multiple myeloma/bone marrow biopsy and aspiration.  Referring Physician(s): Mohamed,Mohamed  Supervising Physician: Aletta Edouard  Patient Status: [facility] - Out-pt  History of Present Illness: Matthew Yates is a 69 y.o. male with a past medical history of hypertension, GERD, multiple myeloma, and arthritis. He was unfortunately diagnosed with multiple myeloma in 2020. His cancer is managed by Dr. Julien Nordmann. He underwent a bone marrow transplant at [facility] 10/08/2018. He has completed systemic chemotherapy at this time. Most recent labs revealed significant increase in free kappa light chain and IgG, concerning for relapse of multiple myeloma.  IR requested by Dr. Julien Nordmann for possible image-guided bone marrow biopsy/aspiration. Patient awake and alert sitting in bed watching TV with no complaints at this time. Denies fever, chills, chest pain, dyspnea, abdominal pain, or headache.   Past Medical History:  Diagnosis Date   Acute medial meniscus tear of left knee    Arthritis    lt knee   GERD (gastroesophageal reflux disease)    Hypertension     Past Surgical History:  Procedure Laterality Date   CHONDROPLASTY Left 07/09/2016   Procedure: CHONDROPLASTY with MICROFRACTURE;  Surgeon: Leandrew Koyanagi, MD;  Location: [facility];  Service: Orthopedics;  Laterality: Left;   COLONOSCOPY     x2   HIP SURGERY     aspiration only, no anesthesia   KNEE ARTHROSCOPY WITH MEDIAL MENISECTOMY Left 07/09/2016   Procedure: LEFT KNEE ARTHROSCOPY WITH PARTIAL MEDIAL MENISCECTOMY;  Surgeon: Leandrew Koyanagi, MD;  Location: [facility];  Service: Orthopedics;  Laterality: Left;    Allergies: Other  Medications: Prior to Admission medications   Medication Sig Start Date End Date Taking? Authorizing Provider  ALPRAZolam (XANAX) 0.25 MG tablet Take 1 tablet (0.25 mg total) by mouth at bedtime as needed for  anxiety. 05/20/18   Heilingoetter, Cassandra L, PA-C  clonazePAM (KLONOPIN) 0.5 MG tablet Take 1 tablet by mouth 2 (two) times daily as needed. 10/25/18   [provider]  gabapentin (NEURONTIN) 300 MG capsule Take 1 capsule (300 mg total) by mouth 3 (three) times daily. 09/28/19   Curt Bears, MD  HYDROcodone-acetaminophen [facility]) 7.5-325 MG tablet Take 1-2 tablets by mouth every 6 (six) hours as needed for moderate pain. 07/09/16   Leandrew Koyanagi, MD  ibuprofen (ADVIL,MOTRIN) 400 MG tablet Take 400 mg by mouth every 6 (six) hours as needed.    [provider]  lenalidomide (REVLIMID) 10 MG capsule TAKE 1 CAPSULE DAILY, CONTINUOUSLY 09/26/19 adult male Josem Kaufmann [phone] 09/28/19   Curt Bears, MD  loratadine-pseudoephedrine (CLARITIN-D 12-HOUR) 5-120 MG tablet Take 1 tablet by mouth 2 (two) times daily.    [provider]  LORazepam (ATIVAN) 0.5 MG tablet Take by mouth.    [provider]  ondansetron (ZOFRAN) 4 MG tablet Take 1-2 tablets (4-8 mg total) by mouth every 8 (eight) hours as needed for nausea or vomiting. 07/09/16   Leandrew Koyanagi, MD  prochlorperazine (COMPAZINE) 10 MG tablet Take 1 tablet (10 mg total) by mouth every 6 (six) hours as needed for nausea or vomiting. 03/22/18   Curt Bears, MD  senna-docusate (SENOKOT S) 8.6-50 MG tablet Take 1 tablet by mouth at bedtime as needed. 07/09/16   Leandrew Koyanagi, MD  traZODone (DESYREL) 50 MG tablet Take 1 tablet by mouth at bedtime as needed. 12/28/18   [provider]  valACYclovir (VALTREX) 500 MG tablet Take 500 mg by mouth  daily. 03/23/19   [provider]  vitamin B-12 (CYANOCOBALAMIN) 1000 MCG tablet Take by mouth.    [provider]  warfarin (COUMADIN) 2 MG tablet TAKE 1 TABLET BY MOUTH EVERY DAY 03/14/19   Curt Bears, MD     Family History  Problem Relation Age of Onset   Cancer Mother    Cancer Sister        ovarian    Social History   Socioeconomic  History   Marital status: Married    Spouse name: Not on file   Number of children: Not on file   Years of education: Not on file   Highest education level: Not on file  Occupational History   Not on file  Tobacco Use   Smoking status: Never Smoker   Smokeless tobacco: Never Used  Vaping Use   Vaping Use: Never used  Substance and Sexual Activity   Alcohol use: No   Drug use: No   Sexual activity: Not Currently  Other Topics Concern   Not on file  Social History Narrative   Not on file   Social Determinants of Health   Financial Resource Strain:    Difficulty of Paying Living Expenses: Not on file  Food Insecurity:    Worried About [place] in the Last Year: Not on file   Ran Out of Food in the Last Year: Not on file  Transportation Needs:    Lack of Transportation (Medical): Not on file   Lack of Transportation (Non-Medical): Not on file  Physical Activity:    Days of Exercise per Week: Not on file   Minutes of Exercise per Session: Not on file  Stress:    Feeling of Stress : Not on file  Social Connections:    Frequency of Communication with Friends and Family: Not on file   Frequency of Social Gatherings with Friends and Family: Not on file   Attends Religious Services: Not on file   Active Member of Clubs or Organizations: Not on file   Attends Archivist Meetings: Not on file   Marital Status: Not on file     Review of Systems: A 12 point ROS discussed and pertinent positives are indicated in the HPI above.  All other systems are negative.  Review of Systems  Constitutional: Negative for chills and fever.  Respiratory: Negative for shortness of breath and wheezing.   Cardiovascular: Negative for chest pain and palpitations.  Gastrointestinal: Negative for abdominal pain.  Neurological: Negative for headaches.  Psychiatric/Behavioral: Negative for behavioral problems and confusion.    Vital Signs: BP (!)  160/77    Pulse 73    Temp 98.4 F (36.9 C) (Oral)    Resp 16    SpO2 99%   Physical Exam Vitals and nursing note reviewed.  Constitutional:      General: He is not in acute distress.    Appearance: Normal appearance.  Cardiovascular:     Rate and Rhythm: Normal rate and regular rhythm.     Heart sounds: Normal heart sounds. No murmur heard.   Pulmonary:     Effort: Pulmonary effort is normal. No respiratory distress.     Breath sounds: Normal breath sounds. No wheezing.  Skin:    General: Skin is warm and dry.  Neurological:     Mental Status: He is alert and oriented to person, place, and time.      MD Evaluation Airway: WNL Heart: WNL Abdomen: WNL Chest/ Lungs: WNL  ASA  Classification: 3 Mallampati/Airway Score: Two   Imaging: No results found.  Labs:  CBC: Recent Labs    03/28/19 0837 08/03/19 0923 08/31/19 0918 09/21/19 0900  WBC 5.2 4.2 4.7 3.8*  HGB 13.9 14.0 13.5 13.4  HCT 41.4 41.6 40.4 40.3  PLT 207 194 191 187    COAGS: No results for input(s): INR, APTT in the last 8760 hours.  BMP: Recent Labs    03/28/19 0837 08/03/19 0923 08/31/19 0918 09/21/19 0900  NA 142 143 139 140  K 3.9 3.7 3.7 3.6  CL 106 107 104 106  CO2 27 25 27 28   GLUCOSE 101* 101* 98 96  BUN 14 8 11 11   CALCIUM 9.4 9.3 9.9 9.6  CREATININE 1.06 1.25* 1.25* 1.25*  GFRNONAA >60 60* 60* 60*  GFRAA >60 >60 >60 >60    LIVER FUNCTION TESTS: Recent Labs    03/28/19 0837 08/03/19 0923 08/31/19 0918 09/21/19 0900  BILITOT 0.4 0.7 0.6 0.6  AST 13* 19 15 16   ALT 14 20 16 18   ALKPHOS 90 116 101 105  PROT 7.5 7.7 7.8 7.3  ALBUMIN 3.9 3.6 3.7 3.4*     Assessment and Plan:  Multiple myeloma s/p bone marrow transplant and systemic chemotherapy, with repeat labs revealing significant increase in free kappa light chain and IgG- concerning for relapse. Plan for image-guided bone marrow biopsy/aspiration today in IR. Patient is NPO. Afebrile.  Risks and benefits  discussed with the patient including, but not limited to bleeding, infection, damage to adjacent structures or low yield requiring additional tests. All of the patient's questions were answered, patient is agreeable to proceed. Consent signed and in chart.   Thank you for this interesting consult.  I greatly enjoyed meeting TAYSHUN GAPPA and look forward to participating in their care.  A copy of this report was sent to the requesting provider on this date.  Electronically Signed: Earley Abide, PA-C 10/06/2019, 9:45 AM   I spent a total of 25 Minutes in face to face in clinical consultation, greater than 50% of which was counseling/coordinating care for multiple myeloma/bone marrow biopsy and aspiration.

## 2019-10-06 NOTE — Progress Notes (Addendum)
Patient sitting on edge of bed and actively throwing up ; Pt does state " this feels like a Sinus headaches I get all the time and I get nauseated when I get them"  Allie, PA paged and will be coming to bedside to assess patient .

## 2019-10-06 NOTE — Progress Notes (Signed)
Pt no longer diaphoretic or nauseated but now c/o headache , allie PA aware and gave me a verbal order for tylenol and to keep patient a little longer

## 2019-10-06 NOTE — Progress Notes (Signed)
Upon patients arrival back from procedure, pt was diaphoretic and c/o nausea ; pt denied any chest pain or sob; Allie, PA was notified and gave me a verbal order for 4mg  of of zofran

## 2019-10-06 NOTE — Progress Notes (Addendum)
IR.  Patient underwent an image-guided bone marrow biopsy/aspiration today in IR.  Following procedure, patient was nauseated. Zofran 4 mg PO given with improvement of nausea, however he then developed an 8/10 headache. Tylenol 650 PO was given and patient ate 1 cracker, however 30 minutes following this patient stood up and vomited x1. Went to evaluate patient bedside following this. He states nausea and headache had improved- currently rated 6/10. States this is "my normal sinus headache" and that he occasionally vomites from headache. Discussed with Dr. Kathlene Cote who recommended IV Phenergren and IV Dilaudid at this time. Discussed with patient who deferred and asked to leave without. However, when he stood up again, he vomited x1. IV Phenergren 12.5 mg and IV Dilaudid 1 mg given to patient. Will reassess following to see if patient stable for discharge.  IR to follow.   ADDENDUM: Went to see patient following IV medications. Patient states that he is feeling better- denies N/V at this time, states headache is 4/10. States this is his normal sinus headache and he "can't wait to go home and lay down". Discussed with Dr. Kathlene Cote- patient stable for discharge.  Please call IR with questions/concerns.    Bea Graff Jermaine Neuharth, PA-C 10/06/2019, 2:57 PM

## 2019-10-06 NOTE — Procedures (Signed)
Interventional Radiology Procedure Note  Procedure: CT guided bone marrow aspiration and biopsy  Complications: None  EBL: < 10 mL  Findings: Aspirate and core biopsy performed of bone marrow in right iliac bone.  Plan: Bedrest supine x 1 hrs  Calyb Mcquarrie T. Jarris Kortz, M.D Pager:  [phone]   

## 2019-10-06 NOTE — Progress Notes (Addendum)
Pt denies any further nausea  And reports a decrease in headache, pt states " I am ready to go home"  [place], [place] came to revaluate patient , per [place], [place]  ok to discharge home .

## 2019-10-06 NOTE — Discharge Instructions (Addendum)
DO NOT SHOWER X 24 HRS  FOR ANY QUESTIONS OR CONCERNS CALL [phone]  Bone Marrow Aspiration and Bone Marrow Biopsy, Adult, Care After This sheet gives you information about how to care for yourself after your procedure. Your health care provider may also give you more specific instructions. If you have problems or questions, contact your health care provider. What can I expect after the procedure? After the procedure, it is common to have:  Mild pain and tenderness.  Swelling.  Bruising. Follow these instructions at home: Puncture site care   Follow instructions from your health care provider about how to take care of the puncture site. Make sure you: ? Wash your hands with soap and water before and after you change your bandage (dressing). If soap and water are not available, use hand sanitizer. ? Change your dressing as told by your health care provider.  Check your puncture site every day for signs of infection. Check for: ? More redness, swelling, or pain. ? Fluid or blood. ? Warmth. ? Pus or a bad smell. Activity  Return to your normal activities as told by your health care provider. Ask your health care provider what activities are safe for you.  Do not lift anything that is heavier than 10 lb (4.5 kg), or the limit that you are told, until your health care provider says that it is safe.  Do not drive for 24 hours if you were given a sedative during your procedure. General instructions   Take over-the-counter and prescription medicines only as told by your health care provider.  Do not take baths, swim, or use a hot tub until your health care provider approves. Ask your health care provider if you may take showers. You may only be allowed to take sponge baths.  If directed, put ice on the affected area. To do this: ? Put ice in a plastic bag. ? Place a towel between your skin and the bag. ? Leave the ice on for 20 minutes, 2-3 times a day.  Keep all follow-up  visits as told by your health care provider. This is important. Contact a health care provider if:  Your pain is not controlled with medicine.  You have a fever.  You have more redness, swelling, or pain around the puncture site.  You have fluid or blood coming from the puncture site.  Your puncture site feels warm to the touch.  You have pus or a bad smell coming from the puncture site. Summary  After the procedure, it is common to have mild pain, tenderness, swelling, and bruising.  Follow instructions from your health care provider about how to take care of the puncture site and what activities are safe for you.  Take over-the-counter and prescription medicines only as told by your health care provider.  Contact a health care provider if you have any signs of infection, such as fluid or blood coming from the puncture site. This information is not intended to replace advice given to you by your health care provider. Make sure you discuss any questions you have with your health care provider. Document Revised: 06/08/2018 Document Reviewed: 06/08/2018 Elsevier Patient Education  2020 [employer] Moderate Conscious Sedation, Adult, Care After These instructions provide you with information about caring for yourself after your procedure. Your health care provider may also give you more specific instructions. Your treatment has been planned according to current medical practices, but problems sometimes occur. Call your health care provider if you have any problems   or questions after your procedure. What can I expect after the procedure? After your procedure, it is common:  To feel sleepy for several hours.  To feel clumsy and have poor balance for several hours.  To have poor judgment for several hours.  To vomit if you eat too soon. Follow these instructions at home: For at least 24 hours after the procedure:   Do not: ? Participate in activities where you could fall or  become injured. ? Drive. ? Use heavy machinery. ? Drink alcohol. ? Take sleeping pills or medicines that cause drowsiness. ? Make important decisions or sign legal documents. ? Take care of children on your own.  Rest. Eating and drinking  Follow the diet recommended by your health care provider.  If you vomit: ? Drink water, juice, or soup when you can drink without vomiting. ? Make sure you have little or no nausea before eating solid foods. General instructions  Have a responsible adult stay with you until you are awake and alert.  Take over-the-counter and prescription medicines only as told by your health care provider.  If you smoke, do not smoke without supervision.  Keep all follow-up visits as told by your health care provider. This is important. Contact a health care provider if:  You keep feeling nauseous or you keep vomiting.  You feel light-headed.  You develop a rash.  You have a fever. Get help right away if:  You have trouble breathing. This information is not intended to replace advice given to you by your health care provider. Make sure you discuss any questions you have with your health care provider. Document Revised: 01/02/2017 Document Reviewed: 05/12/2015 Elsevier Patient Education  2020 Elsevier Inc.  

## 2019-10-17 ENCOUNTER — Encounter (HOSPITAL_COMMUNITY): Payer: Self-pay | Admitting: Internal Medicine

## 2019-10-18 ENCOUNTER — Inpatient Hospital Stay: Payer: BC Managed Care – PPO | Attending: Internal Medicine | Admitting: Internal Medicine

## 2019-10-18 ENCOUNTER — Other Ambulatory Visit: Payer: Self-pay

## 2019-10-18 ENCOUNTER — Other Ambulatory Visit: Payer: Self-pay | Admitting: Medical Oncology

## 2019-10-18 ENCOUNTER — Encounter: Payer: Self-pay | Admitting: Internal Medicine

## 2019-10-18 VITALS — BP 135/75 | HR 73 | Temp 97.5°F | Resp 18 | Ht 75.0 in | Wt 252.2 lb

## 2019-10-18 DIAGNOSIS — C9001 Multiple myeloma in remission: Secondary | ICD-10-CM

## 2019-10-18 DIAGNOSIS — Z79899 Other long term (current) drug therapy: Secondary | ICD-10-CM | POA: Insufficient documentation

## 2019-10-18 DIAGNOSIS — Z9484 Stem cells transplant status: Secondary | ICD-10-CM | POA: Diagnosis not present

## 2019-10-18 DIAGNOSIS — Z5111 Encounter for antineoplastic chemotherapy: Secondary | ICD-10-CM

## 2019-10-18 DIAGNOSIS — G629 Polyneuropathy, unspecified: Secondary | ICD-10-CM | POA: Diagnosis not present

## 2019-10-18 DIAGNOSIS — F419 Anxiety disorder, unspecified: Secondary | ICD-10-CM

## 2019-10-18 DIAGNOSIS — C9000 Multiple myeloma not having achieved remission: Secondary | ICD-10-CM | POA: Diagnosis present

## 2019-10-18 LAB — SURGICAL PATHOLOGY

## 2019-10-18 NOTE — Progress Notes (Signed)
[facility] [phone]   Fax:([phone]) [phone]  OFFICE PROGRESS NOTE  Matthew Late, MD [phone] [facility] [place] [address]  DIAGNOSIS: Multiple myeloma, IgG subtype diagnosed in January 2020.  PRIOR THERAPY:  1) Systemic chemotherapy with Velcade 1.3 mg/M2 on days 1, 8, 15 as well as Revlimid 25 mg p.o. daily for 21 days every 4 weeks as well as Decadron 40 mg weekly.  First dose expected March 22, 2018.  Status post 6 cycles. 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  CURRENT THERAPY:  Maintenance Revlimid 10 mg p.o. daily.  Status post 5 months of treatment.  INTERVAL HISTORY: Matthew Yates 69 y.o. male returns to the clinic today for follow-up visit.  The patient is feeling fine today with no concerning complaints except for anxiety about his bone marrow results.  He denied having any current chest pain, shortness of breath, cough or hemoptysis.  He denied having any fever or chills.  He has no nausea, vomiting, diarrhea or constipation.  He has no headache or visual changes.  His peripheral neuropathy has improved.  He has an appointment at [facility] in the next few weeks.  He had a bone marrow biopsy and aspirate performed recently and is here for evaluation and discussion of his biopsy results and recommendation regarding his condition.   MEDICAL HISTORY: Past Medical History:  Diagnosis Date  . Acute medial meniscus tear of left knee   . Arthritis    lt knee  . GERD (gastroesophageal reflux disease)   . Hypertension     ALLERGIES:  is allergic to other.  MEDICATIONS:  Current Outpatient Medications  Medication Sig Dispense Refill  . ALPRAZolam (XANAX) 0.25 MG tablet Take 1 tablet (0.25 mg total) by mouth at bedtime as needed for anxiety. 30 tablet 0  . clonazePAM (KLONOPIN) 0.5 MG tablet Take 1 tablet by mouth 2 (two) times daily as needed.    .  gabapentin (NEURONTIN) 300 MG capsule Take 1 capsule (300 mg total) by mouth 3 (three) times daily. 90 capsule 2  . HYDROcodone-acetaminophen (NORCO) 7.5-325 MG tablet Take 1-2 tablets by mouth every 6 (six) hours as needed for moderate pain. 90 tablet 0  . ibuprofen (ADVIL,MOTRIN) 400 MG tablet Take 400 mg by mouth every 6 (six) hours as needed.    Marland Kitchen lenalidomide (REVLIMID) 10 MG capsule TAKE 1 CAPSULE DAILY, CONTINUOUSLY 09/26/19 adult male auth [phone] 28 capsule 0  . loratadine-pseudoephedrine (CLARITIN-D 12-HOUR) 5-120 MG tablet Take 1 tablet by mouth 2 (two) times daily.    Marland Kitchen LORazepam (ATIVAN) 0.5 MG tablet Take by mouth.    . ondansetron (ZOFRAN) 4 MG tablet Take 1-2 tablets (4-8 mg total) by mouth every 8 (eight) hours as needed for nausea or vomiting. 40 tablet 0  . prochlorperazine (COMPAZINE) 10 MG tablet Take 1 tablet (10 mg total) by mouth every 6 (six) hours as needed for nausea or vomiting. 30 tablet 0  . senna-docusate (SENOKOT S) 8.6-50 MG tablet Take 1 tablet by mouth at bedtime as needed. 30 tablet 1  . traZODone (DESYREL) 50 MG tablet Take 1 tablet by mouth at bedtime as needed.    . valACYclovir (VALTREX) 500 MG tablet Take 500 mg by mouth daily.    . vitamin B-12 (CYANOCOBALAMIN) 1000 MCG tablet Take by mouth.    . warfarin (COUMADIN) 2 MG tablet TAKE 1 TABLET BY  MOUTH EVERY DAY 90 tablet 1   No current facility-administered medications for this visit.    SURGICAL HISTORY:  Past Surgical History:  Procedure Laterality Date  . CHONDROPLASTY Left 07/09/2016   Procedure: CHONDROPLASTY with MICROFRACTURE;  Surgeon: Leandrew Koyanagi, MD;  Location: [facility];  Service: Orthopedics;  Laterality: Left;  . COLONOSCOPY     x2  . HIP SURGERY     aspiration only, no anesthesia  . KNEE ARTHROSCOPY WITH MEDIAL MENISECTOMY Left 07/09/2016   Procedure: LEFT KNEE ARTHROSCOPY WITH PARTIAL MEDIAL MENISCECTOMY;  Surgeon: Leandrew Koyanagi, MD;  Location: [facility];  Service: Orthopedics;  Laterality: Left;    REVIEW OF SYSTEMS:  Constitutional: positive for fatigue Eyes: negative Ears, nose, mouth, throat, and face: negative Respiratory: negative Cardiovascular: negative Gastrointestinal: negative Genitourinary:negative Integument/breast: negative Hematologic/lymphatic: negative Musculoskeletal:negative Neurological: negative Behavioral/Psych: positive for anxiety Endocrine: negative Allergic/Immunologic: negative   PHYSICAL EXAMINATION: General appearance: alert, cooperative, fatigued and no distress Head: Normocephalic, without obvious abnormality, atraumatic Neck: no adenopathy, no JVD, supple, symmetrical, trachea midline and thyroid not enlarged, symmetric, no tenderness/mass/nodules Lymph nodes: Cervical, supraclavicular, and axillary nodes normal. Resp: clear to auscultation bilaterally Back: symmetric, no curvature. ROM normal. No CVA tenderness. Cardio: regular rate and rhythm, S1, S2 normal, no murmur, click, rub or gallop GI: soft, non-tender; bowel sounds normal; no masses,  no organomegaly Extremities: extremities normal, atraumatic, no cyanosis or edema Neurologic: Alert and oriented X 3, normal strength and tone. Normal symmetric reflexes. Normal coordination and gait  ECOG PERFORMANCE STATUS: 1 - Symptomatic but completely ambulatory  Blood pressure 135/75, pulse 73, temperature (!) 97.5 F (36.4 C), temperature source Tympanic, resp. rate 18, height 6' 3"  (1.905 m), weight 252 lb 3.2 oz (114.4 kg), SpO2 98 %.  LABORATORY DATA: Lab Results  Component Value Date   WBC 4.2 October 25, 2019   HGB 13.4 10/25/19   HCT 40.1 10/25/2019   MCV 99.0 25-Oct-2019   PLT 185 25-Oct-2019      Chemistry      Component Value Date/Time   NA 140 09/21/2019 0900   K 3.6 09/21/2019 0900   CL 106 09/21/2019 0900   CO2 28 09/21/2019 0900   BUN 11 09/21/2019 0900   CREATININE 1.25 (H) 09/21/2019 0900      Component Value  Date/Time   CALCIUM 9.6 09/21/2019 0900   ALKPHOS 105 09/21/2019 0900   AST 16 09/21/2019 0900   ALT 18 09/21/2019 0900   BILITOT 0.6 09/21/2019 0900       RADIOGRAPHIC STUDIES: CT BIOPSY  Result Date: 25-Oct-2019 CLINICAL DATA:  History of multiple myeloma and prior bone marrow transplant. Recent kappa and lambda protein spikes. Need for bone marrow biopsy. EXAM: CT GUIDED BONE MARROW ASPIRATION AND BIOPSY ANESTHESIA/SEDATION: Versed 2.0 mg IV, Fentanyl 100 mcg IV Total Moderate Sedation Time:   15 minutes. The patient's level of consciousness and physiologic status were continuously monitored during the procedure by Radiology nursing. PROCEDURE: The procedure risks, benefits, and alternatives were explained to the patient. Questions regarding the procedure were encouraged and answered. The patient understands and consents to the procedure. A time out was performed prior to initiating the procedure. The right gluteal region was prepped with chlorhexidine. Sterile gown and sterile gloves were used for the procedure. Local anesthesia was provided with 1% Lidocaine. Under CT guidance, an 11 gauge On Control bone cutting needle was advanced from a posterior approach into the right iliac bone. Needle positioning was confirmed with CT.  Initial non heparinized and heparinized aspirate samples were obtained of bone marrow. Core biopsy was performed via the On Control drill needle. COMPLICATIONS: None FINDINGS: Inspection of initial aspirate did reveal visible particles. Intact core biopsy sample was obtained. IMPRESSION: CT guided bone marrow biopsy of right posterior iliac bone with both aspirate and core samples obtained. Electronically Signed   By: Aletta Edouard M.D.   On: 10/06/2019 12:25   CT BONE MARROW BIOPSY & ASPIRATION  Result Date: 10/06/2019 CLINICAL DATA:  History of multiple myeloma and prior bone marrow transplant. Recent kappa and lambda protein spikes. Need for bone marrow biopsy. EXAM: CT  GUIDED BONE MARROW ASPIRATION AND BIOPSY ANESTHESIA/SEDATION: Versed 2.0 mg IV, Fentanyl 100 mcg IV Total Moderate Sedation Time:   15 minutes. The patient's level of consciousness and physiologic status were continuously monitored during the procedure by Radiology nursing. PROCEDURE: The procedure risks, benefits, and alternatives were explained to the patient. Questions regarding the procedure were encouraged and answered. The patient understands and consents to the procedure. A time out was performed prior to initiating the procedure. The right gluteal region was prepped with chlorhexidine. Sterile gown and sterile gloves were used for the procedure. Local anesthesia was provided with 1% Lidocaine. Under CT guidance, an 11 gauge On Control bone cutting needle was advanced from a posterior approach into the right iliac bone. Needle positioning was confirmed with CT. Initial non heparinized and heparinized aspirate samples were obtained of bone marrow. Core biopsy was performed via the On Control drill needle. COMPLICATIONS: None FINDINGS: Inspection of initial aspirate did reveal visible particles. Intact core biopsy sample was obtained. IMPRESSION: CT guided bone marrow biopsy of right posterior iliac bone with both aspirate and core samples obtained. Electronically Signed   By: Aletta Edouard M.D.   On: 10/06/2019 12:25    ASSESSMENT AND PLAN: This is a very pleasant 69 years old African-American male recently diagnosed with multiple myeloma, IgG subtype.  He is currently undergoing treatment with Revlimid, Velcade and Decadron status post 6 cycles. The patient continues to tolerate this treatment well with no concerning adverse effect except for mild peripheral neuropathy. The patient has very good response to this treatment. On October 08, 2018 he underwent autologous stem cell transplant at [facility] and tolerated the procedure well  The patient is currently on maintenance treatment with  Revlimid 10 mg p.o. daily status post 4 months of treatment. He continues to tolerate his treatment well except for intermittent diarrhea as well as the peripheral neuropathy. He had repeat myeloma panel performed recently. His myeloma panel showed significant increase in the free kappa light chain as well as the IgG. The patient had a bone marrow biopsy and aspirate performed recently.  The biopsy showed no concerning findings for myeloma relapse. I recommended for the patient to continue his current maintenance treatment with Revlimid 10 mg p.o. daily.  I may consider increasing the dose to 15 mg in the future if he continues to have further increase and the myeloma protein. For the peripheral neuropathy, I will increase his dose of gabapentin to 300 mg p.o. 3 times daily. He will come back for follow-up visit in 2 months for evaluation with repeat myeloma panel. The patient was advised to call immediately if he has any concerning symptoms in the interval. The patient voices understanding of current disease status and treatment options and is in agreement with the current care plan. All questions were answered. The patient knows to call the clinic  with any problems, questions or concerns. We can certainly see the patient much sooner if necessary.    Disclaimer: This note was dictated with voice recognition software. Similar sounding words can inadvertently be transcribed and may not be corrected upon review.

## 2019-10-19 ENCOUNTER — Telehealth: Payer: Self-pay | Admitting: Internal Medicine

## 2019-10-19 NOTE — Telephone Encounter (Signed)
Scheduled per los. Called and spoke with patient. Confirmed appt 

## 2019-10-26 IMAGING — CT CT BONE MARROW BIOPSY AND ASPIRATION
1 of 2 series · 15 of 28 positions shown, 19 images · non-contrast
Comparison: none

CLINICAL DATA: History of multiple myeloma not having achieved
remission. Bone marrow biopsy required to establish status of
disease.

[Series 2: i-spiral 5.0 b40f · axial · 0.93mm/px · z∈[+1219,+1348]mm · 15 of 41 slices shown, 19 images]
[im 2/41  mediastinal]
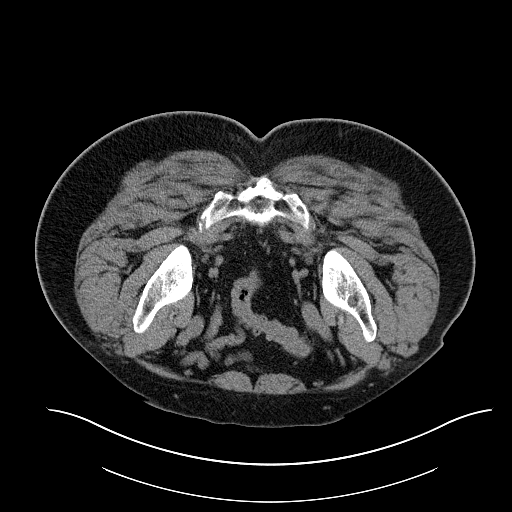
[im 2/41  lung]
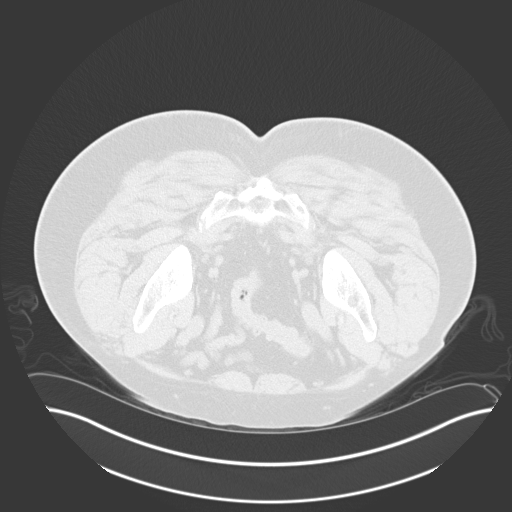
[im 6/41  lung]
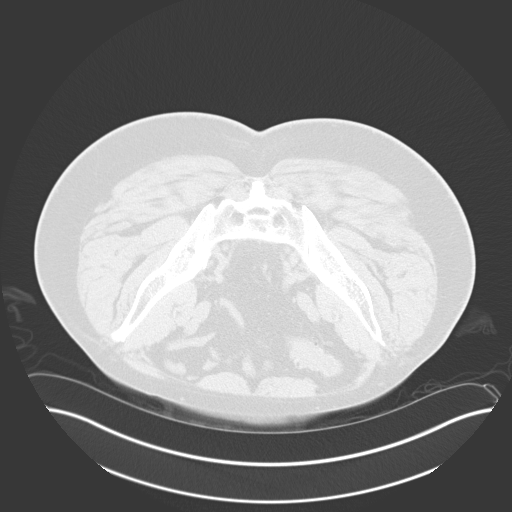
[im 8/41  lung]
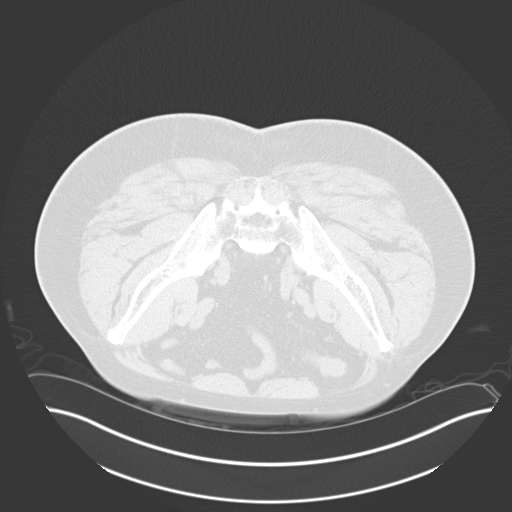
[im 10/41  lung]
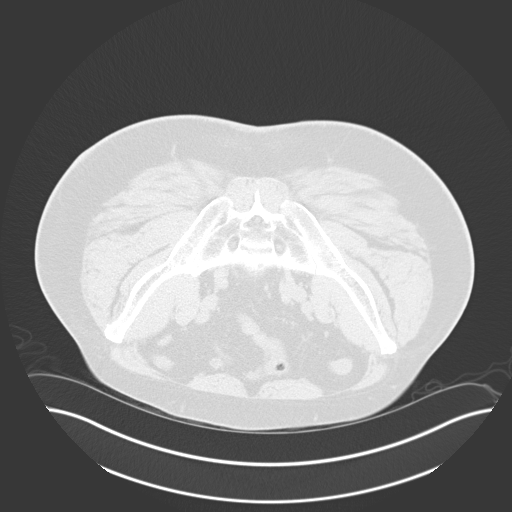
[im 13/41  mediastinal]
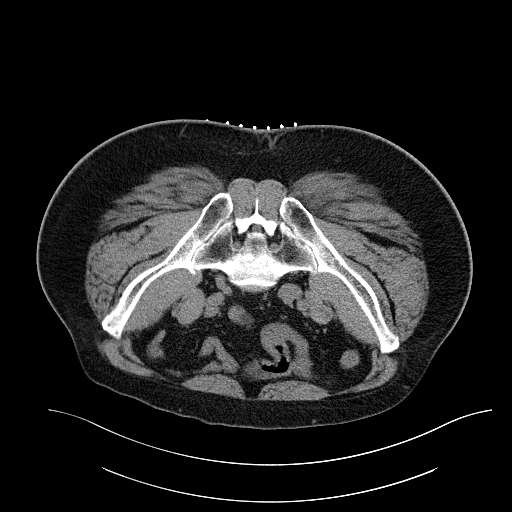
[im 13/41  lung]
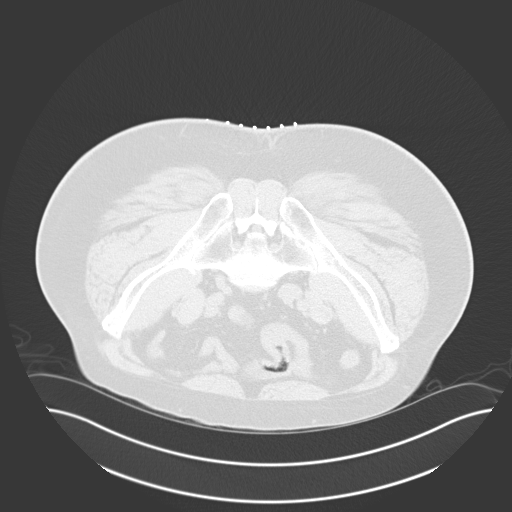
[im 15/41  lung]
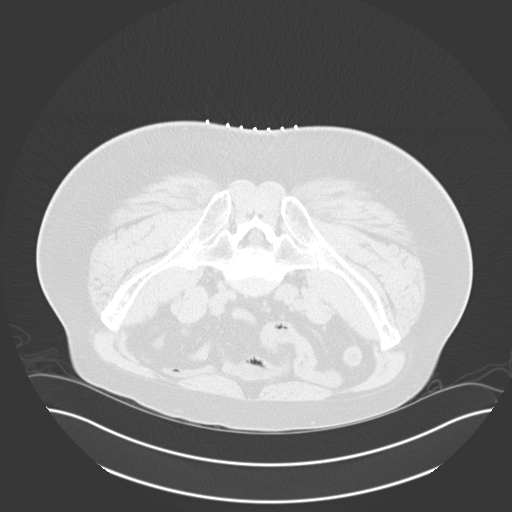
[im 19/41  lung]
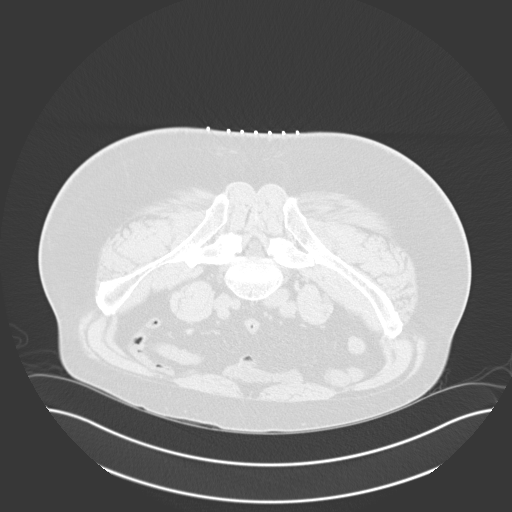
[im 21/41  lung]
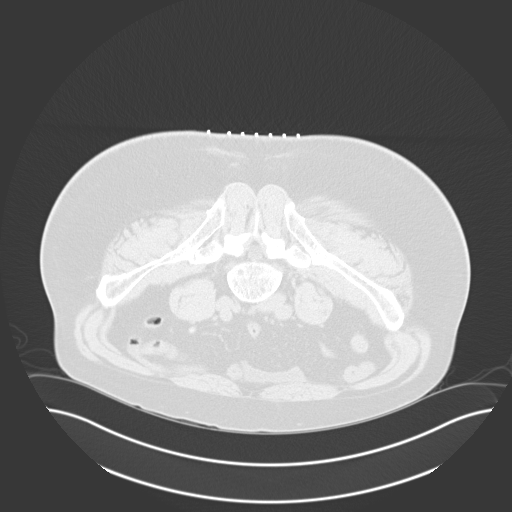
[im 22/41  mediastinal]
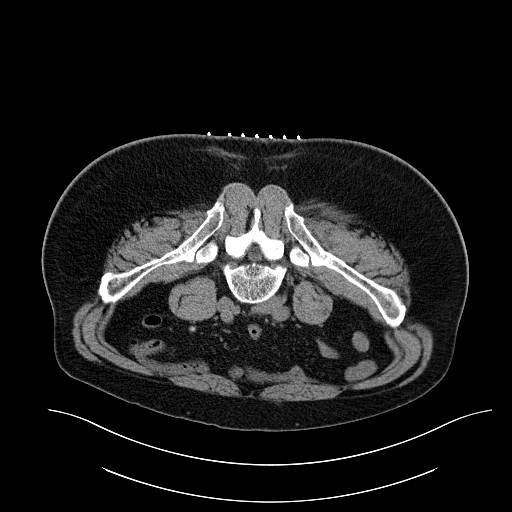
[im 22/41  lung]
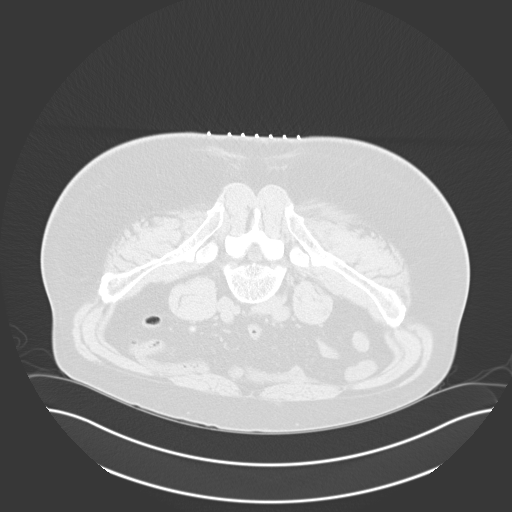
[im 26/41  lung]
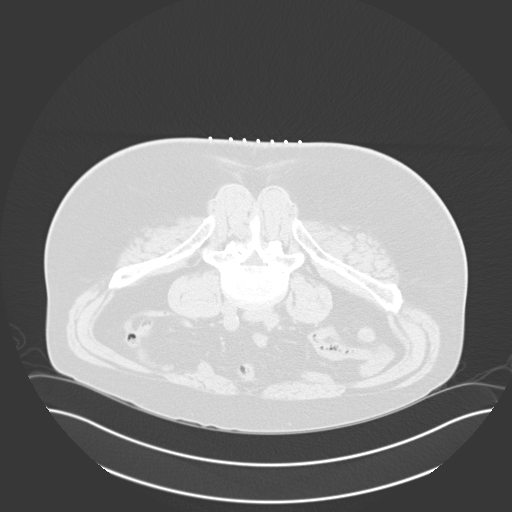
[im 28/41  lung]
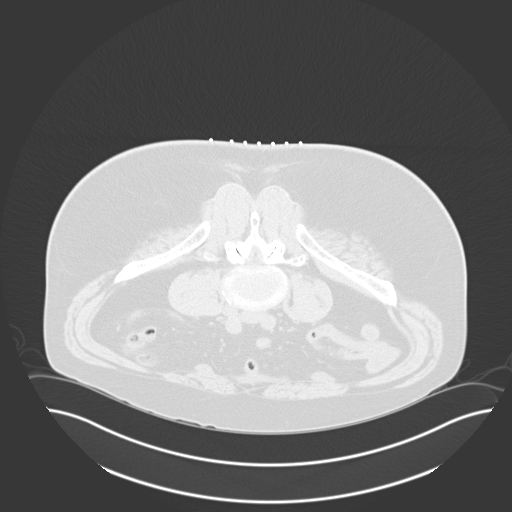
[im 31/41  lung]
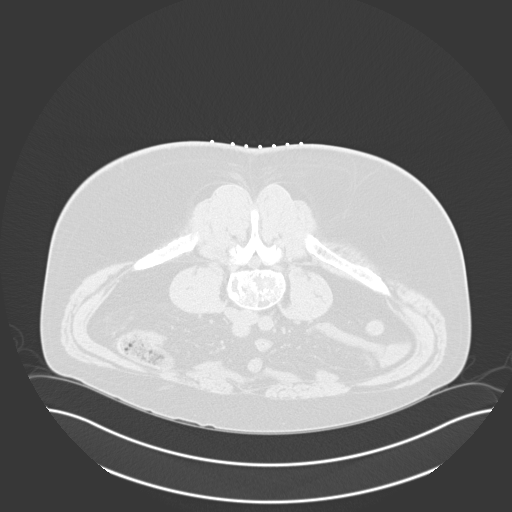
[im 33/41  mediastinal]
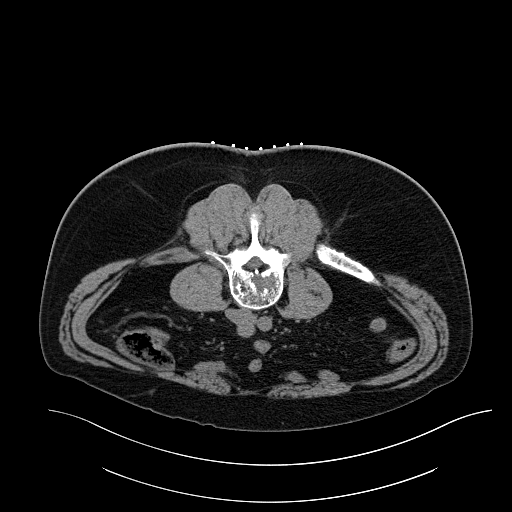
[im 33/41  lung]
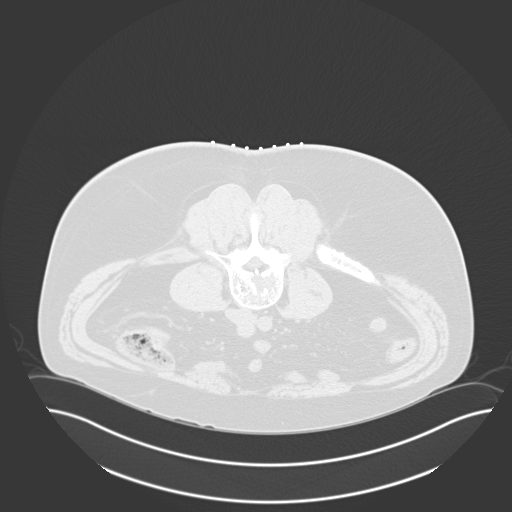
[im 35/41  lung]
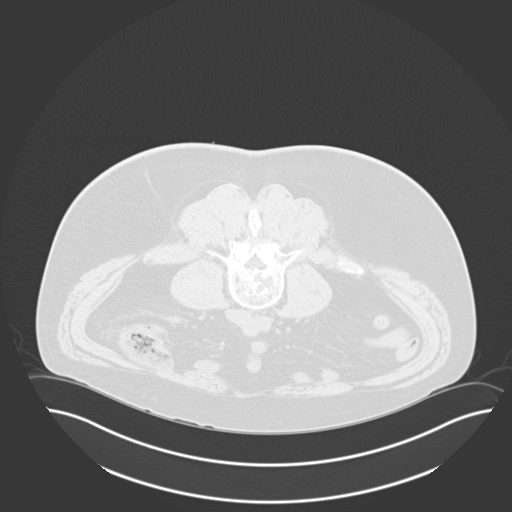
[im 39/41  lung]
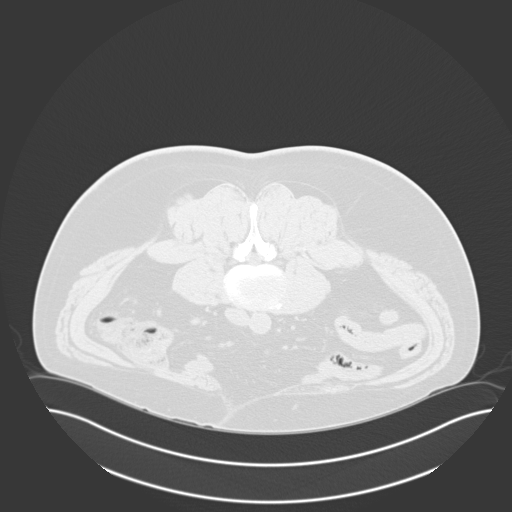

[15 of 28 positions shown; findings below may reference images not displayed]

EXAM:
CT GUIDED BONE MARROW ASPIRATION AND BIOPSY

ANESTHESIA/SEDATION:
Versed 2.0 mg IV, Fentanyl 100 mcg IV

Total Moderate Sedation Time:   13 minutes.

The patient's level of consciousness and physiologic status were
continuously monitored during the procedure by Radiology nursing.

PROCEDURE:
The procedure risks, benefits, and alternatives were explained to
the patient. Questions regarding the procedure were encouraged and
answered. The patient understands and consents to the procedure. A
time out was performed prior to initiating the procedure.

The right gluteal region was prepped with chlorhexidine. Sterile
gown and sterile gloves were used for the procedure. Local
anesthesia was provided with 1% Lidocaine.

Under CT guidance, an 11 gauge On Control bone cutting needle was
advanced from a posterior approach into the right iliac bone. Needle
positioning was confirmed with CT. Initial non heparinized and
heparinized aspirate samples were obtained of bone marrow. Core
biopsy was performed via the On Control drill needle.

COMPLICATIONS:
None
FINDINGS: Inspection of initial aspirate did reveal visible particles. Intact
core biopsy sample was obtained.
IMPRESSION: CT guided bone marrow biopsy of right posterior iliac bone with both
aspirate and core samples obtained.

## 2019-10-27 ENCOUNTER — Other Ambulatory Visit: Payer: Self-pay | Admitting: *Deleted

## 2019-10-27 DIAGNOSIS — C9001 Multiple myeloma in remission: Secondary | ICD-10-CM

## 2019-10-27 MED ORDER — LENALIDOMIDE 10 MG PO CAPS: ORAL_CAPSULE | ORAL | 0 refills | Status: DC

## 2019-11-19 ENCOUNTER — Other Ambulatory Visit: Payer: Self-pay | Admitting: Internal Medicine

## 2019-11-19 DIAGNOSIS — C9001 Multiple myeloma in remission: Secondary | ICD-10-CM

## 2019-11-28 ENCOUNTER — Other Ambulatory Visit: Payer: Self-pay | Admitting: Medical Oncology

## 2019-11-28 DIAGNOSIS — C9001 Multiple myeloma in remission: Secondary | ICD-10-CM

## 2019-11-28 MED ORDER — LENALIDOMIDE 10 MG PO CAPS: ORAL_CAPSULE | ORAL | 0 refills | Status: DC

## 2019-12-13 ENCOUNTER — Other Ambulatory Visit: Payer: Self-pay

## 2019-12-13 ENCOUNTER — Inpatient Hospital Stay: Payer: BC Managed Care – PPO | Attending: Internal Medicine

## 2019-12-13 DIAGNOSIS — Z79899 Other long term (current) drug therapy: Secondary | ICD-10-CM | POA: Diagnosis not present

## 2019-12-13 DIAGNOSIS — C9000 Multiple myeloma not having achieved remission: Secondary | ICD-10-CM | POA: Insufficient documentation

## 2019-12-13 DIAGNOSIS — Z9484 Stem cells transplant status: Secondary | ICD-10-CM | POA: Diagnosis not present

## 2019-12-13 LAB — CMP (CANCER CENTER ONLY)
ALT: 25 U/L (ref 0–44)
AST: 16 U/L (ref 15–41)
Albumin: 3.6 g/dL (ref 3.5–5.0)
Alkaline Phosphatase: 101 U/L (ref 38–126)
Anion gap: 7 (ref 5–15)
BUN: 9 mg/dL (ref 8–23)
CO2: 29 mmol/L (ref 22–32)
Calcium: 9 mg/dL (ref 8.9–10.3)
Chloride: 105 mmol/L (ref 98–111)
Creatinine: 1.25 mg/dL — ABNORMAL HIGH (ref 0.61–1.24)
GFR, Estimated: >60 mL/min (ref >60)
Glucose, Bld: 97 mg/dL (ref 70–99)
Potassium: 3.3 mmol/L — ABNORMAL LOW (ref 3.5–5.1)
Sodium: 141 mmol/L (ref 135–145)
Total Bilirubin: 0.8 mg/dL (ref 0.3–1.2)
Total Protein: 7.2 g/dL (ref 6.5–8.1)

## 2019-12-13 LAB — CBC WITH DIFFERENTIAL (CANCER CENTER ONLY)
Abs Immature Granulocytes: 0 10*3/uL (ref 0.00–0.07)
Basophils Absolute: 0.1 10*3/uL (ref 0.0–0.1)
Basophils Relative: 1 %
Eosinophils Absolute: 0.5 10*3/uL (ref 0.0–0.5)
Eosinophils Relative: 12 %
HCT: 39.5 % (ref 39.0–52.0)
Hemoglobin: 13.5 g/dL (ref 13.0–17.0)
Immature Granulocytes: 0 %
Lymphocytes Relative: 48 %
Lymphs Abs: 1.7 10*3/uL (ref 0.7–4.0)
MCH: 34.2 pg — ABNORMAL HIGH (ref 26.0–34.0)
MCHC: 34.2 g/dL (ref 30.0–36.0)
MCV: 100 fL (ref 80.0–100.0)
Monocytes Absolute: 0.4 10*3/uL (ref 0.1–1.0)
Monocytes Relative: 12 %
Neutro Abs: 1 10*3/uL — ABNORMAL LOW (ref 1.7–7.7)
Neutrophils Relative %: 27 %
Platelet Count: 177 10*3/uL (ref 150–400)
RBC: 3.95 MIL/uL — ABNORMAL LOW (ref 4.22–5.81)
RDW: 13.8 % (ref 11.5–15.5)
WBC Count: 3.6 10*3/uL — ABNORMAL LOW (ref 4.0–10.5)
nRBC: 0 % (ref 0.0–0.2)

## 2019-12-13 LAB — LACTATE DEHYDROGENASE: LDH: 127 U/L (ref 98–192)

## 2019-12-14 LAB — KAPPA/LAMBDA LIGHT CHAINS
Kappa free light chain: 91 mg/L — ABNORMAL HIGH (ref 3.3–19.4)
Kappa, lambda light chain ratio: 3.84 — ABNORMAL HIGH (ref 0.26–1.65)
Lambda free light chains: 23.7 mg/L (ref 5.7–26.3)

## 2019-12-14 LAB — IGG, IGA, IGM
IgA: 209 mg/dL (ref 61–437)
IgG (Immunoglobin G), Serum: 1657 mg/dL — ABNORMAL HIGH (ref 603–1613)
IgM (Immunoglobulin M), Srm: 40 mg/dL (ref 20–172)

## 2019-12-14 LAB — BETA 2 MICROGLOBULIN, SERUM: Beta-2 Microglobulin: 1.8 mg/L (ref 0.6–2.4)

## 2019-12-20 ENCOUNTER — Other Ambulatory Visit: Payer: Self-pay

## 2019-12-20 ENCOUNTER — Inpatient Hospital Stay (HOSPITAL_BASED_OUTPATIENT_CLINIC_OR_DEPARTMENT_OTHER): Payer: BC Managed Care – PPO | Admitting: Internal Medicine

## 2019-12-20 ENCOUNTER — Encounter: Payer: Self-pay | Admitting: Internal Medicine

## 2019-12-20 VITALS — BP 131/69 | HR 80 | Temp 98.3°F | Resp 18 | Ht 75.0 in | Wt 251.1 lb

## 2019-12-20 DIAGNOSIS — Z5111 Encounter for antineoplastic chemotherapy: Secondary | ICD-10-CM | POA: Diagnosis not present

## 2019-12-20 DIAGNOSIS — C9001 Multiple myeloma in remission: Secondary | ICD-10-CM | POA: Diagnosis not present

## 2019-12-20 DIAGNOSIS — C9000 Multiple myeloma not having achieved remission: Secondary | ICD-10-CM | POA: Diagnosis not present

## 2019-12-20 MED ORDER — LENALIDOMIDE 10 MG PO CAPS: ORAL_CAPSULE | ORAL | 0 refills | Status: DC

## 2019-12-20 NOTE — Progress Notes (Signed)
[place] Telephone:([phone]) [phone]   Fax:([phone]) [phone]  OFFICE PROGRESS NOTE  Derinda Late, MD [phone] [address] [facility] [place] [address]  DIAGNOSIS: Multiple myeloma, IgG subtype diagnosed in January 2020.  PRIOR THERAPY:  1) Systemic chemotherapy with Velcade 1.3 mg/M2 on days 1, 8, 15 as well as Revlimid 25 mg p.o. daily for 21 days every 4 weeks as well as Decadron 40 mg weekly.  First dose expected March 22, 2018.  Status post 6 cycles. 2) autologous stem cell transplant at [facility] on 10/08/2018.  CURRENT THERAPY:  Maintenance Revlimid 10 mg p.o. daily.  Status post 6 months of treatment.  INTERVAL HISTORY: Matthew Yates 69 y.o. male returns to the clinic today for follow-up visit. The patient is feeling fine today with no concerning complaints except for the anxiety. He denied having any current chest pain, shortness of breath, cough or hemoptysis. He denied having any fever or chills. He has no nausea, vomiting, diarrhea or constipation. He has no headache or visual changes. He has occasional weakness and neuropathy in the lower extremities. He had repeat myeloma panel performed recently and he is here for evaluation and discussion of his lab results. The patient was seen at [facility] by Dr. Leida Lauth who recommended for the patient to continue his current treatment with Revlimid for now and monitor him for any disease progression.   MEDICAL HISTORY: Past Medical History:  Diagnosis Date  . Acute medial meniscus tear of left knee   . Arthritis    lt knee  . GERD (gastroesophageal reflux disease)   . Hypertension     ALLERGIES:  is allergic to other.  MEDICATIONS:  Current Outpatient Medications  Medication Sig Dispense Refill  . ALPRAZolam (XANAX) 0.25 MG tablet Take 1 tablet (0.25 mg total) by mouth at bedtime as needed for anxiety. 30 tablet 0  . gabapentin (NEURONTIN) 300 MG  capsule Take 1 capsule (300 mg total) by mouth 3 (three) times daily. 90 capsule 2  . ibuprofen (ADVIL,MOTRIN) 400 MG tablet Take 400 mg by mouth every 6 (six) hours as needed.    Marland Kitchen lenalidomide (REVLIMID) 10 MG capsule TAKE 1 CAPSULE DAILY CONTINUOUSLY 28 capsule 0  . valACYclovir (VALTREX) 500 MG tablet Take 500 mg by mouth daily.    . vitamin B-12 (CYANOCOBALAMIN) 1000 MCG tablet Take by mouth.    . clonazePAM (KLONOPIN) 0.5 MG tablet Take 1 tablet by mouth 2 (two) times daily as needed. (Patient not taking: Reported on 12/20/2019)    . HYDROcodone-acetaminophen (NORCO) 7.5-325 MG tablet Take 1-2 tablets by mouth every 6 (six) hours as needed for moderate pain. (Patient not taking: Reported on 12/20/2019) 90 tablet 0  . loratadine-pseudoephedrine (CLARITIN-D 12-HOUR) 5-120 MG tablet Take 1 tablet by mouth 2 (two) times daily. (Patient not taking: Reported on 12/20/2019)    . LORazepam (ATIVAN) 0.5 MG tablet Take by mouth. (Patient not taking: Reported on 12/20/2019)    . ondansetron (ZOFRAN) 4 MG tablet Take 1-2 tablets (4-8 mg total) by mouth every 8 (eight) hours as needed for nausea or vomiting. (Patient not taking: Reported on 12/20/2019) 40 tablet 0  . prochlorperazine (COMPAZINE) 10 MG tablet Take 1 tablet (10 mg total) by mouth every 6 (six) hours as needed for nausea or vomiting. (Patient not taking: Reported on 12/20/2019) 30 tablet 0  . senna-docusate (SENOKOT S) 8.6-50 MG tablet Take 1 tablet by mouth  at bedtime as needed. (Patient not taking: Reported on 12/20/2019) 30 tablet 1  . traZODone (DESYREL) 50 MG tablet Take 1 tablet by mouth at bedtime as needed. (Patient not taking: Reported on 12/20/2019)    . warfarin (COUMADIN) 2 MG tablet TAKE 1 TABLET BY MOUTH EVERY DAY (Patient not taking: Reported on 12/20/2019) 90 tablet 1   No current facility-administered medications for this visit.    SURGICAL HISTORY:  Past Surgical History:  Procedure Laterality Date  . CHONDROPLASTY Left  07/09/2016   Procedure: CHONDROPLASTY with MICROFRACTURE;  Surgeon: Leandrew Koyanagi, MD;  Location: [place];  Service: Orthopedics;  Laterality: Left;  . COLONOSCOPY     x2  . HIP SURGERY     aspiration only, no anesthesia  . KNEE ARTHROSCOPY WITH MEDIAL MENISECTOMY Left 07/09/2016   Procedure: LEFT KNEE ARTHROSCOPY WITH PARTIAL MEDIAL MENISCECTOMY;  Surgeon: Leandrew Koyanagi, MD;  Location: [place];  Service: Orthopedics;  Laterality: Left;    REVIEW OF SYSTEMS:  Constitutional: negative Eyes: negative Ears, nose, mouth, throat, and face: negative Respiratory: negative Cardiovascular: negative Gastrointestinal: negative Genitourinary:negative Integument/breast: negative Hematologic/lymphatic: negative Musculoskeletal:positive for muscle weakness Neurological: positive for paresthesia Behavioral/Psych: positive for anxiety Endocrine: negative Allergic/Immunologic: negative   PHYSICAL EXAMINATION: General appearance: alert, cooperative, fatigued and no distress Head: Normocephalic, without obvious abnormality, atraumatic Neck: no adenopathy, no JVD, supple, symmetrical, trachea midline and thyroid not enlarged, symmetric, no tenderness/mass/nodules Lymph nodes: Cervical, supraclavicular, and axillary nodes normal. Resp: clear to auscultation bilaterally Back: symmetric, no curvature. ROM normal. No CVA tenderness. Cardio: regular rate and rhythm, S1, S2 normal, no murmur, click, rub or gallop GI: soft, non-tender; bowel sounds normal; no masses,  no organomegaly Extremities: extremities normal, atraumatic, no cyanosis or edema Neurologic: Alert and oriented X 3, normal strength and tone. Normal symmetric reflexes. Normal coordination and gait  ECOG PERFORMANCE STATUS: 1 - Symptomatic but completely ambulatory  Blood pressure 131/69, pulse 80, temperature 98.3 F (36.8 C), temperature source Tympanic, resp. rate 18, height 6' 3"  (1.905 m), weight 251 lb  1.6 oz (113.9 kg), SpO2 98 %.  LABORATORY DATA: Lab Results  Component Value Date   WBC 3.6 (L) 12/13/2019   HGB 13.5 12/13/2019   HCT 39.5 12/13/2019   MCV 100.0 12/13/2019   PLT 177 12/13/2019      Chemistry      Component Value Date/Time   NA 141 12/13/2019 0858   K 3.3 (L) 12/13/2019 0858   CL 105 12/13/2019 0858   CO2 29 12/13/2019 0858   BUN 9 12/13/2019 0858   CREATININE 1.25 (H) 12/13/2019 0858      Component Value Date/Time   CALCIUM 9.0 12/13/2019 0858   ALKPHOS 101 12/13/2019 0858   AST 16 12/13/2019 0858   ALT 25 12/13/2019 0858   BILITOT 0.8 12/13/2019 0858       RADIOGRAPHIC STUDIES: No results found.  ASSESSMENT AND PLAN: This is a very pleasant 69 years old African-American male recently diagnosed with multiple myeloma, IgG subtype.  He is currently undergoing treatment with Revlimid, Velcade and Decadron status post 6 cycles. The patient continues to tolerate this treatment well with no concerning adverse effect except for mild peripheral neuropathy. The patient has very good response to this treatment. On October 08, 2018 he underwent autologous stem cell transplant at [facility] and tolerated the procedure well  The patient is currently on maintenance treatment with Revlimid 10 mg p.o. daily status post 6 months of  treatment. The patient continues to tolerate his treatment with Revlimid fairly well. He had repeat myeloma panel performed recently. I discussed the lab results with the patient. He has no evidence for further progression of his disease and there was actually improvement and the IgG as well as the free kappa light chain. I recommended for the patient to continue his current treatment with Revlimid with the same dose for now. I will see him back for follow-up visit in 6 weeks for evaluation and repeat blood work. For the peripheral neuropathy, he will continue his current treatment with gabapentin. The patient was advised to call  immediately if he has any concerning symptoms in the interval.  The patient voices understanding of current disease status and treatment options and is in agreement with the current care plan. All questions were answered. The patient knows to call the clinic with any problems, questions or concerns. We can certainly see the patient much sooner if necessary.    Disclaimer: This note was dictated with voice recognition software. Similar sounding words can inadvertently be transcribed and may not be corrected upon review.

## 2019-12-21 ENCOUNTER — Other Ambulatory Visit: Payer: Self-pay | Admitting: Medical Oncology

## 2019-12-21 DIAGNOSIS — C9001 Multiple myeloma in remission: Secondary | ICD-10-CM

## 2019-12-21 MED ORDER — LENALIDOMIDE 10 MG PO CAPS: ORAL_CAPSULE | ORAL | 0 refills | Status: DC

## 2020-01-20 ENCOUNTER — Other Ambulatory Visit: Payer: Self-pay | Admitting: Internal Medicine

## 2020-01-20 DIAGNOSIS — C9001 Multiple myeloma in remission: Secondary | ICD-10-CM

## 2020-01-23 ENCOUNTER — Other Ambulatory Visit: Payer: Self-pay | Admitting: *Deleted

## 2020-01-23 DIAGNOSIS — C9001 Multiple myeloma in remission: Secondary | ICD-10-CM

## 2020-01-23 MED ORDER — LENALIDOMIDE 10 MG PO CAPS: ORAL_CAPSULE | ORAL | 0 refills | Status: DC

## 2020-02-08 ENCOUNTER — Telehealth: Payer: Self-pay | Admitting: Physician Assistant

## 2020-02-08 ENCOUNTER — Other Ambulatory Visit: Payer: Self-pay | Admitting: Physician Assistant

## 2020-02-08 ENCOUNTER — Other Ambulatory Visit: Payer: Self-pay | Admitting: Internal Medicine

## 2020-02-08 DIAGNOSIS — C9000 Multiple myeloma not having achieved remission: Secondary | ICD-10-CM

## 2020-02-08 DIAGNOSIS — C9001 Multiple myeloma in remission: Secondary | ICD-10-CM

## 2020-02-08 DIAGNOSIS — F419 Anxiety disorder, unspecified: Secondary | ICD-10-CM

## 2020-02-08 MED ORDER — ALPRAZOLAM 0.25 MG PO TABS: 0.2500 mg | ORAL_TABLET | Freq: Every evening | ORAL | 0 refills | Status: AC | PRN

## 2020-02-08 NOTE — Progress Notes (Signed)
[place] OFFICE PROGRESS NOTE  Derinda Late, MD [phone] [address] [facility] [place] [address]  DIAGNOSIS: Multiple myeloma, IgG subtype diagnosed in January 2020.  PRIOR THERAPY: 1) Systemic chemotherapy with Velcade 1.3 mg/M2 on days 1, 8, 15 as well as Revlimid 25 mg p.o. daily for 21 days every 4 weeks as well as Decadron 40 mg weekly. First dose on March 22, 2018.  Status post 6 cycles. 2) autologous stem cell transplant at [facility] on 10/08/2018.  CURRENT THERAPY: Maintenance Revlimid 10 mg p.o. daily.  Status post 8 months of treatment.  INTERVAL HISTORY: Matthew Yates 70 y.o. male returns to the clinic today for follow-up visit.  The patient is feeling well today without any concerning complaints except for anxiety regarding the COVID-19 pandemic and his immunosuppression. I did refill his low dose xanax which he has not needed to take yet. This was last prescribed last year and he only uses it sparingly. The patient is also followed at [facility] by Dr. Leida Lauth regarding his multiple myeloma and has a follow-up appointment with him next week on 02/14/2020.  Overall the patient is feeling well today and he is tolerating his Revlimid well except for occasional periodic diarrhea.  When he experiences diarrhea, he takes an over-the-counter antidiarrheal medication similar to Imodium that works for him.  He had some diarrhea earlier this week.  He also endorses some fatigue.  Otherwise he denies any fevers, chills, night sweats, or unexplained weight loss.  He denies any current signs and symptoms of infection including sore throat, nasal congestion, cough, shortness of breath, skin infections, or dysuria.  He denies any pain.  He was taking gabapentin for peripheral neuropathy but ran out of gabapentin and he is wondering if he should continue this medication.  Denies any abnormal bleeding or bruising.  The patient is  here today for evaluation and repeat blood work.   MEDICAL HISTORY: Past Medical History:  Diagnosis Date  . Acute medial meniscus tear of left knee   . Arthritis    lt knee  . GERD (gastroesophageal reflux disease)   . Hypertension     ALLERGIES:  is allergic to other.  MEDICATIONS:  Current Outpatient Medications  Medication Sig Dispense Refill  . ALPRAZolam (XANAX) 0.25 MG tablet Take 1 tablet (0.25 mg total) by mouth at bedtime as needed for anxiety. 30 tablet 0  . clonazePAM (KLONOPIN) 0.5 MG tablet Take 1 tablet by mouth 2 (two) times daily as needed.    Marland Kitchen ibuprofen (ADVIL,MOTRIN) 400 MG tablet Take 400 mg by mouth every 6 (six) hours as needed.    Marland Kitchen lenalidomide (REVLIMID) 10 MG capsule TAKE 1 CAPSULE DAILY CONTINUOUSLY 28 capsule 0  . vitamin B-12 (CYANOCOBALAMIN) 1000 MCG tablet Take by mouth.    . gabapentin (NEURONTIN) 300 MG capsule Take 1 capsule (300 mg total) by mouth 3 (three) times daily. 90 capsule 2  . HYDROcodone-acetaminophen (NORCO) 7.5-325 MG tablet Take 1-2 tablets by mouth every 6 (six) hours as needed for moderate pain. (Patient not taking: Reported on 02/09/2020) 90 tablet 0  . loratadine-pseudoephedrine (CLARITIN-D 12-HOUR) 5-120 MG tablet Take 1 tablet by mouth 2 (two) times daily. (Patient not taking: Reported on 02/09/2020)    . LORazepam (ATIVAN) 0.5 MG tablet Take by mouth. (Patient not taking: No sig reported)    . ondansetron (ZOFRAN) 4 MG tablet Take 1-2 tablets (4-8 mg total) by mouth every  8 (eight) hours as needed for nausea or vomiting. (Patient not taking: No sig reported) 40 tablet 0  . prochlorperazine (COMPAZINE) 10 MG tablet Take 1 tablet (10 mg total) by mouth every 6 (six) hours as needed for nausea or vomiting. (Patient not taking: No sig reported) 30 tablet 0  . senna-docusate (SENOKOT S) 8.6-50 MG tablet Take 1 tablet by mouth at bedtime as needed. (Patient not taking: No sig reported) 30 tablet 1  . traZODone (DESYREL) 50 MG tablet Take 1  tablet by mouth at bedtime as needed. (Patient not taking: No sig reported)    . valACYclovir (VALTREX) 500 MG tablet Take 500 mg by mouth daily. (Patient not taking: Reported on 02/09/2020)    . warfarin (COUMADIN) 2 MG tablet TAKE 1 TABLET BY MOUTH EVERY DAY (Patient not taking: No sig reported) 90 tablet 1   No current facility-administered medications for this visit.    SURGICAL HISTORY:  Past Surgical History:  Procedure Laterality Date  . CHONDROPLASTY Left 07/09/2016   Procedure: CHONDROPLASTY with MICROFRACTURE;  Surgeon: Leandrew Koyanagi, MD;  Location: [place];  Service: Orthopedics;  Laterality: Left;  . COLONOSCOPY     x2  . HIP SURGERY     aspiration only, no anesthesia  . KNEE ARTHROSCOPY WITH MEDIAL MENISECTOMY Left 07/09/2016   Procedure: LEFT KNEE ARTHROSCOPY WITH PARTIAL MEDIAL MENISCECTOMY;  Surgeon: Leandrew Koyanagi, MD;  Location: [place];  Service: Orthopedics;  Laterality: Left;    REVIEW OF SYSTEMS:   Review of Systems  Constitutional: Positive for fatigue and decreased appetite. Negative for chills, fever and unexpected weight change.  HENT: Negative for mouth sores, nosebleeds, sore throat and trouble swallowing.   Eyes: Negative for eye problems and icterus.  Respiratory: Negative for cough, hemoptysis, shortness of breath and wheezing.   Cardiovascular: Negative for chest pain and leg swelling.  Gastrointestinal: Positive for occasional diarrhea. Negative for abdominal pain, constipation, nausea and vomiting.  Genitourinary: Negative for bladder incontinence, difficulty urinating, dysuria, frequency and hematuria.   Musculoskeletal: Negative for back pain, gait problem, neck pain and neck stiffness.  Skin: Negative for itching and rash.  Neurological: Negative for dizziness, extremity weakness, gait problem, headaches, light-headedness and seizures.  Hematological: Negative for adenopathy. Does not bruise/bleed easily.   Psychiatric/Behavioral: Positive for anxiety. Negative for confusion, depression and sleep disturbance.     PHYSICAL EXAMINATION:  Blood pressure (!) 160/74, pulse 82, temperature 97.9 F (36.6 C), temperature source Tympanic, resp. rate 17, height _0  (1.905 m), weight 252 lb 14.4 oz (114.7 kg), SpO2 100 %.  ECOG PERFORMANCE STATUS: 1 - Symptomatic but completely ambulatory  Physical Exam  Constitutional: Oriented to person, place, and time and well-developed, well-nourished, and in no distress.  HENT:  Head: Normocephalic and atraumatic.  Mouth/Throat: Oropharynx is clear and moist. No oropharyngeal exudate.  Eyes: Conjunctivae are normal. Right eye exhibits no discharge. Left eye exhibits no discharge. No scleral icterus.  Neck: Normal range of motion. Neck supple.  Cardiovascular: Normal rate, regular rhythm, normal heart sounds and intact distal pulses.   Pulmonary/Chest: Effort normal and breath sounds normal. No respiratory distress. No wheezes. No rales.  Abdominal: Soft. Bowel sounds are normal. Exhibits no distension and no mass. There is no tenderness.  Musculoskeletal: Normal range of motion. Exhibits no edema.  Lymphadenopathy:    No cervical adenopathy.  Neurological: Alert and oriented to person, place, and time. Exhibits normal muscle tone. Gait normal. Coordination normal.  Skin: Skin  is warm and dry. No rash noted. Not diaphoretic. No erythema. No pallor.  Psychiatric: Mood, memory and judgment normal.  Vitals reviewed.  LABORATORY DATA: Lab Results  Component Value Date   WBC 3.5 (L) 02/09/2020   HGB 13.8 02/09/2020   HCT 40.3 02/09/2020   MCV 101.3 (H) 02/09/2020   PLT 161 02/09/2020      Chemistry      Component Value Date/Time   NA 141 02/09/2020 1008   K 3.4 (L) 02/09/2020 1008   CL 106 02/09/2020 1008   CO2 25 02/09/2020 1008   BUN 11 02/09/2020 1008   CREATININE 1.35 (H) 02/09/2020 1008      Component Value Date/Time   CALCIUM 9.1  02/09/2020 1008   ALKPHOS 116 02/09/2020 1008   AST 21 02/09/2020 1008   ALT 26 02/09/2020 1008   BILITOT 0.7 02/09/2020 1008       RADIOGRAPHIC STUDIES:  No results found.   ASSESSMENT/PLAN:  This is a very pleasant 70 year old African-American male diagnosed with multiple myeloma, IgG subtype. He tolerated this well except he developed peripheral neuropathy. He was diagnosed in January 2020. He completed treatment with Revlimid, Velcade and Decadron status post 6 cycles.   On October 08, 2018 he underwent autologous stem cell transplant at [facility] and tolerated the procedure well.   He is currently on maintenance treatment with Revlimid 10 mg p.o. daily status post 8 months of treatment. He tolerates this well except for manageable diarrhea and fatigue.   Labs were reviewed which shows WBC at 3.5, ANC 1.1.Neutropenic and pandemic precautions reviewed. Hgb WNL at 13.8. Platelets WNL at 161k.  Potassium slightly low at 3.4. Creatinine slightly elevated at 1.35. The patient believes he may be a little dehydrated because he had diarrhea earlier this week. Patient given an handout on potassium rich food and he was encouraged to stay hydrated. CMP otherwise unremarkable.  His SPEP from today is still pending. The patient will continue on Revlimid at the same dose for now.   The patient has a follow up with Dr. Leida Lauth next week at [facility]. He knows to keep this appointment as scheduled.   We will see the patient back for a follow up appointment in about 6 weeks for evaluation and for a repeat CBC, CMP, LDH, beta 2 microglobulin, SPEP and IFE, quantitative immunoglobulins, and kappa/lambda light chains.   I refilled the patient's gabapentin for his peripheral neuropathy.   The patient will continue taking xanax for anxiety. He knows to only take this sparingly.   A total time of 30-40 minutes was spent with the patient.   The patient was advised to call immediately if he has any  concerning symptoms in the interval. The patient voices understanding of current disease status and treatment options and is in agreement with the current care plan. All questions were answered. The patient knows to call the clinic with any problems, questions or concerns. We can certainly see the patient much sooner if necessary     Orders Placed This Encounter  Procedures  . CBC with Differential (Cancer Center Only)    Standing Status:   Future    Standing Expiration Date:   02/08/2021  . CMP ([place] only)    Standing Status:   Future    Standing Expiration Date:   02/08/2021  . Lactate dehydrogenase (LDH)    Standing Status:   Future    Standing Expiration Date:   02/08/2021  . SPEP  with reflex to IFE    Standing Status:   Future    Standing Expiration Date:   02/08/2021  . Beta 2 microglobulin    Standing Status:   Future    Standing Expiration Date:   02/08/2021  . QIG  (Quant. immunoglobulins  - IgG, IgA, IgM)    Standing Status:   Future    Standing Expiration Date:   02/08/2021  . Kappa/lambda light chains    Standing Status:   Future    Standing Expiration Date:   02/08/2021     Luverta Korte L Berklie Dethlefs, PA-C 02/09/20

## 2020-02-08 NOTE — Telephone Encounter (Signed)
Scheduled apt per 1/6 schmsg - pt is aware of apt.

## 2020-02-09 ENCOUNTER — Inpatient Hospital Stay: Payer: 59

## 2020-02-09 ENCOUNTER — Encounter: Payer: Self-pay | Admitting: Physician Assistant

## 2020-02-09 ENCOUNTER — Inpatient Hospital Stay: Payer: 59 | Attending: Internal Medicine | Admitting: Physician Assistant

## 2020-02-09 ENCOUNTER — Other Ambulatory Visit: Payer: Self-pay

## 2020-02-09 VITALS — BP 160/74 | HR 82 | Temp 97.9°F | Resp 17 | Ht 75.0 in | Wt 252.9 lb

## 2020-02-09 DIAGNOSIS — G620 Drug-induced polyneuropathy: Secondary | ICD-10-CM | POA: Insufficient documentation

## 2020-02-09 DIAGNOSIS — Z79899 Other long term (current) drug therapy: Secondary | ICD-10-CM | POA: Insufficient documentation

## 2020-02-09 DIAGNOSIS — Z9484 Stem cells transplant status: Secondary | ICD-10-CM | POA: Insufficient documentation

## 2020-02-09 DIAGNOSIS — F419 Anxiety disorder, unspecified: Secondary | ICD-10-CM | POA: Insufficient documentation

## 2020-02-09 DIAGNOSIS — C9001 Multiple myeloma in remission: Secondary | ICD-10-CM

## 2020-02-09 DIAGNOSIS — C9000 Multiple myeloma not having achieved remission: Secondary | ICD-10-CM

## 2020-02-09 DIAGNOSIS — T451X5A Adverse effect of antineoplastic and immunosuppressive drugs, initial encounter: Secondary | ICD-10-CM | POA: Insufficient documentation

## 2020-02-09 LAB — CMP (CANCER CENTER ONLY)
ALT: 26 U/L (ref 0–44)
AST: 21 U/L (ref 15–41)
Albumin: 3.7 g/dL (ref 3.5–5.0)
Alkaline Phosphatase: 116 U/L (ref 38–126)
Anion gap: 10 (ref 5–15)
BUN: 11 mg/dL (ref 8–23)
CO2: 25 mmol/L (ref 22–32)
Calcium: 9.1 mg/dL (ref 8.9–10.3)
Chloride: 106 mmol/L (ref 98–111)
Creatinine: 1.35 mg/dL — ABNORMAL HIGH (ref 0.61–1.24)
GFR, Estimated: 58 mL/min — ABNORMAL LOW (ref >60)
Glucose, Bld: 103 mg/dL — ABNORMAL HIGH (ref 70–99)
Potassium: 3.4 mmol/L — ABNORMAL LOW (ref 3.5–5.1)
Sodium: 141 mmol/L (ref 135–145)
Total Bilirubin: 0.7 mg/dL (ref 0.3–1.2)
Total Protein: 7.7 g/dL (ref 6.5–8.1)

## 2020-02-09 LAB — CBC WITH DIFFERENTIAL (CANCER CENTER ONLY)
Abs Immature Granulocytes: 0 10*3/uL (ref 0.00–0.07)
Basophils Absolute: 0.1 10*3/uL (ref 0.0–0.1)
Basophils Relative: 3 %
Eosinophils Absolute: 0.4 10*3/uL (ref 0.0–0.5)
Eosinophils Relative: 11 %
HCT: 40.3 % (ref 39.0–52.0)
Hemoglobin: 13.8 g/dL (ref 13.0–17.0)
Immature Granulocytes: 0 %
Lymphocytes Relative: 42 %
Lymphs Abs: 1.5 10*3/uL (ref 0.7–4.0)
MCH: 34.7 pg — ABNORMAL HIGH (ref 26.0–34.0)
MCHC: 34.2 g/dL (ref 30.0–36.0)
MCV: 101.3 fL — ABNORMAL HIGH (ref 80.0–100.0)
Monocytes Absolute: 0.5 10*3/uL (ref 0.1–1.0)
Monocytes Relative: 14 %
Neutro Abs: 1.1 10*3/uL — ABNORMAL LOW (ref 1.7–7.7)
Neutrophils Relative %: 30 %
Platelet Count: 161 10*3/uL (ref 150–400)
RBC: 3.98 MIL/uL — ABNORMAL LOW (ref 4.22–5.81)
RDW: 14 % (ref 11.5–15.5)
WBC Count: 3.5 10*3/uL — ABNORMAL LOW (ref 4.0–10.5)
nRBC: 0 % (ref 0.0–0.2)

## 2020-02-09 LAB — LACTATE DEHYDROGENASE: LDH: 126 U/L (ref 98–192)

## 2020-02-09 MED ORDER — GABAPENTIN 300 MG PO CAPS: 300.0000 mg | ORAL_CAPSULE | Freq: Three times a day (TID) | ORAL | 2 refills | Status: DC

## 2020-02-10 LAB — PROTEIN ELECTROPHORESIS, SERUM, WITH REFLEX
A/G Ratio: 1 (ref 0.7–1.7)
Albumin ELP: 3.5 g/dL (ref 2.9–4.4)
Alpha-1-Globulin: 0.2 g/dL (ref 0.0–0.4)
Alpha-2-Globulin: 0.6 g/dL (ref 0.4–1.0)
Beta Globulin: 1 g/dL (ref 0.7–1.3)
Gamma Globulin: 1.7 g/dL (ref 0.4–1.8)
Globulin, Total: 3.5 g/dL (ref 2.2–3.9)
Total Protein ELP: 7 g/dL (ref 6.0–8.5)

## 2020-02-13 ENCOUNTER — Telehealth: Payer: Self-pay | Admitting: Physician Assistant

## 2020-02-13 NOTE — Telephone Encounter (Signed)
Scheduled appts per 1/6 los. Pt confirmed appt dates and times.  

## 2020-02-18 ENCOUNTER — Other Ambulatory Visit: Payer: Self-pay | Admitting: Internal Medicine

## 2020-02-18 DIAGNOSIS — C9001 Multiple myeloma in remission: Secondary | ICD-10-CM

## 2020-02-22 MED ORDER — LENALIDOMIDE 10 MG PO CAPS: ORAL_CAPSULE | ORAL | 0 refills | Status: DC

## 2020-02-22 NOTE — Addendum Note (Signed)
Addended by: Ardeen Garland on: 02/22/2020 12:59 PM   Modules accepted: Orders

## 2020-02-28 ENCOUNTER — Telehealth: Payer: Self-pay | Admitting: Medical Oncology

## 2020-02-28 ENCOUNTER — Telehealth: Payer: Self-pay

## 2020-02-28 NOTE — Telephone Encounter (Signed)
[name] Encounter  Received notification from [employer] that prior authorization for Revlimid is required.  PA submitted on CoverMyMeds Key [id number] Status is pending  Oral Oncology Clinic will continue to follow.   [place] Patient [place] Phone [phone] Fax [phone] 02/28/2020 1:16 PM

## 2020-02-28 NOTE — Telephone Encounter (Signed)
New insurance with [employer] now.  [facility] will expedite his revlimid ordered 02/22/20 as soon as they have his new insurance.

## 2020-02-28 NOTE — Telephone Encounter (Signed)
[name] Encounter  Prior Authorization for Revlimid has been approved.    PA# [id number] Effective dates: 02/04/20 through 02/27/23  Patient must fill at [facility] will continue to follow.   [place] Patient [place] Phone [phone] Fax [phone] 02/28/2020 1:26 PM

## 2020-03-01 ENCOUNTER — Other Ambulatory Visit: Payer: Self-pay | Admitting: Medical Oncology

## 2020-03-01 DIAGNOSIS — C9001 Multiple myeloma in remission: Secondary | ICD-10-CM

## 2020-03-01 MED ORDER — LENALIDOMIDE 10 MG PO CAPS: ORAL_CAPSULE | ORAL | 0 refills | Status: DC

## 2020-03-15 ENCOUNTER — Inpatient Hospital Stay: Payer: 59 | Attending: Internal Medicine

## 2020-03-15 ENCOUNTER — Other Ambulatory Visit: Payer: Self-pay

## 2020-03-15 DIAGNOSIS — Z9221 Personal history of antineoplastic chemotherapy: Secondary | ICD-10-CM | POA: Insufficient documentation

## 2020-03-15 DIAGNOSIS — G620 Drug-induced polyneuropathy: Secondary | ICD-10-CM | POA: Insufficient documentation

## 2020-03-15 DIAGNOSIS — Z79899 Other long term (current) drug therapy: Secondary | ICD-10-CM | POA: Insufficient documentation

## 2020-03-15 DIAGNOSIS — Z9484 Stem cells transplant status: Secondary | ICD-10-CM | POA: Diagnosis not present

## 2020-03-15 DIAGNOSIS — Z7982 Long term (current) use of aspirin: Secondary | ICD-10-CM | POA: Insufficient documentation

## 2020-03-15 DIAGNOSIS — T451X5A Adverse effect of antineoplastic and immunosuppressive drugs, initial encounter: Secondary | ICD-10-CM | POA: Diagnosis not present

## 2020-03-15 DIAGNOSIS — C9000 Multiple myeloma not having achieved remission: Secondary | ICD-10-CM | POA: Diagnosis not present

## 2020-03-15 DIAGNOSIS — D701 Agranulocytosis secondary to cancer chemotherapy: Secondary | ICD-10-CM | POA: Insufficient documentation

## 2020-03-15 LAB — CBC WITH DIFFERENTIAL (CANCER CENTER ONLY)
Abs Immature Granulocytes: 0.01 10*3/uL (ref 0.00–0.07)
Basophils Absolute: 0.1 10*3/uL (ref 0.0–0.1)
Basophils Relative: 1 %
Eosinophils Absolute: 0.4 10*3/uL (ref 0.0–0.5)
Eosinophils Relative: 8 %
HCT: 41.4 % (ref 39.0–52.0)
Hemoglobin: 14.2 g/dL (ref 13.0–17.0)
Immature Granulocytes: 0 %
Lymphocytes Relative: 46 %
Lymphs Abs: 2.2 10*3/uL (ref 0.7–4.0)
MCH: 34.6 pg — ABNORMAL HIGH (ref 26.0–34.0)
MCHC: 34.3 g/dL (ref 30.0–36.0)
MCV: 101 fL — ABNORMAL HIGH (ref 80.0–100.0)
Monocytes Absolute: 0.6 10*3/uL (ref 0.1–1.0)
Monocytes Relative: 12 %
Neutro Abs: 1.6 10*3/uL — ABNORMAL LOW (ref 1.7–7.7)
Neutrophils Relative %: 33 %
Platelet Count: 149 10*3/uL — ABNORMAL LOW (ref 150–400)
RBC: 4.1 MIL/uL — ABNORMAL LOW (ref 4.22–5.81)
RDW: 13.4 % (ref 11.5–15.5)
WBC Count: 4.9 10*3/uL (ref 4.0–10.5)
nRBC: 0 % (ref 0.0–0.2)

## 2020-03-15 LAB — CMP (CANCER CENTER ONLY)
ALT: 26 U/L (ref 0–44)
AST: 19 U/L (ref 15–41)
Albumin: 3.9 g/dL (ref 3.5–5.0)
Alkaline Phosphatase: 104 U/L (ref 38–126)
Anion gap: 6 (ref 5–15)
BUN: 12 mg/dL (ref 8–23)
CO2: 29 mmol/L (ref 22–32)
Calcium: 9.2 mg/dL (ref 8.9–10.3)
Chloride: 104 mmol/L (ref 98–111)
Creatinine: 1.27 mg/dL — ABNORMAL HIGH (ref 0.61–1.24)
GFR, Estimated: >60 mL/min (ref >60)
Glucose, Bld: 101 mg/dL — ABNORMAL HIGH (ref 70–99)
Potassium: 3.9 mmol/L (ref 3.5–5.1)
Sodium: 139 mmol/L (ref 135–145)
Total Bilirubin: 0.6 mg/dL (ref 0.3–1.2)
Total Protein: 7.6 g/dL (ref 6.5–8.1)

## 2020-03-15 LAB — LACTATE DEHYDROGENASE: LDH: 121 U/L (ref 98–192)

## 2020-03-16 LAB — IGG, IGA, IGM
IgA: 231 mg/dL (ref 61–437)
IgG (Immunoglobin G), Serum: 1677 mg/dL — ABNORMAL HIGH (ref 603–1613)
IgM (Immunoglobulin M), Srm: 22 mg/dL (ref 20–172)

## 2020-03-16 LAB — KAPPA/LAMBDA LIGHT CHAINS
Kappa free light chain: 60.6 mg/L — ABNORMAL HIGH (ref 3.3–19.4)
Kappa, lambda light chain ratio: 2.27 — ABNORMAL HIGH (ref 0.26–1.65)
Lambda free light chains: 26.7 mg/L — ABNORMAL HIGH (ref 5.7–26.3)

## 2020-03-16 LAB — BETA 2 MICROGLOBULIN, SERUM: Beta-2 Microglobulin: 1.8 mg/L (ref 0.6–2.4)

## 2020-03-16 NOTE — Progress Notes (Signed)
[place] OFFICE PROGRESS NOTE  Derinda Late, MD [phone] [address] [facility] [place] [address]  DIAGNOSIS: Multiple myeloma, IgG subtype diagnosed in January 2020.  PRIOR THERAPY: 1) Systemic chemotherapy with Velcade 1.3 mg/M2 on days 1, 8, 15 as well as Revlimid 25 mg p.o. daily for 21 days every 4 weeks as well as Decadron 40 mg weekly. First dose on March 22, 2018. Status post 6 cycles. 2) autologous stem cell transplant at [facility] on 10/08/2018.  CURRENT THERAPY: Maintenance Revlimid 10 mg p.o. daily. Status post 31month of treatment.   INTERVAL HISTORY: Matthew MERRY70y.o. male returns to the clinic today for a follow-up visit.  The patient is feeling well today without any concerning complaints.  In the interval since his last appointment, the [facility].  The summary from this appointment was that the patient should continue on 81 milligrams daily of aspirin which the patient is compliant with.  Dr. LLeida Lauthalso recommends considering monthly Zometa injections once the patient obtains clearance from his dentist. They also recommended continuing on his current dose of  Revlimid to 10 mg per metered squared for now given his borderline low ANC but would perhaps recommend increasing to 15 mg in the future.  Dr. LEdson Snowballnote indicated that if the patient develops disease progression in the future that they would recommend daratumumab-based therapy (combined with either carfilzomib or pomalidomide versus consideration of a clinical trial. [facility]   In the interval since his last appointment, the patient denies any fever, chills, night sweats, or unexplained weight loss.  He denies any signs and symptoms of infection including sore throat, nasal congestion, cough, skin infections, or dysuria.  The  patient will recieve fourth COVID-19 booster vaccine next month [facility]  The patient continues to have peripheral neuropathy.  He did not have significant improvement with gabapentin and has weaned off of this.  He denies any abnormal bleeding or bruising.  The patient recently had a repeat myeloma panel performed.  The patient is here today for evaluation and to review his results.  MEDICAL HISTORY: Past Medical History:  Diagnosis Date  . Acute medial meniscus tear of left knee   . Arthritis    lt knee  . GERD (gastroesophageal reflux disease)   . Hypertension     ALLERGIES:  is allergic to other.  MEDICATIONS:  Current Outpatient Medications  Medication Sig Dispense Refill  . ALPRAZolam (XANAX) 0.25 MG tablet Take 1 tablet (0.25 mg total) by mouth at bedtime as needed for anxiety. 30 tablet 0  . amLODipine (NORVASC) 5 MG tablet Take 5 mg by mouth daily.    . clonazePAM (KLONOPIN) 0.5 MG tablet Take 1 tablet by mouth 2 (two) times daily as needed.    .Marland Kitchenesomeprazole (NEXIUM) 20 MG capsule Take by mouth.    . gabapentin (NEURONTIN) 300 MG capsule Take 1 capsule (300 mg total) by mouth 3 (three) times daily. 90 capsule 2  . HYDROcodone-acetaminophen (NORCO) 7.5-325 MG tablet Take 1-2 tablets by mouth every 6 (six) hours as needed for moderate pain. 90 tablet 0  . ibuprofen (ADVIL,MOTRIN) 400 MG tablet Take 400 mg by mouth every 6 (six) hours as needed.    .Marland Kitchenlenalidomide (REVLIMID) 10 MG capsule TAKE 1 CAPSULE DAILY, CONTINUOUSLY  02/22/2020 authorization [id number]  male 28 capsule 0  . loratadine-pseudoephedrine (CLARITIN-D 12-HOUR) 5-120 MG tablet Take 1 tablet by mouth 2 (two) times daily.    Marland Kitchen LORazepam (ATIVAN) 0.5 MG tablet Take by mouth.    . ondansetron (ZOFRAN) 4 MG tablet Take 1-2 tablets (4-8 mg total) by mouth every 8 (eight) hours as needed for nausea or vomiting. 40 tablet 0  . prochlorperazine (COMPAZINE) 10 MG tablet Take 1 tablet (10 mg  total) by mouth every 6 (six) hours as needed for nausea or vomiting. 30 tablet 0  . senna-docusate (SENOKOT S) 8.6-50 MG tablet Take 1 tablet by mouth at bedtime as needed. 30 tablet 1  . traZODone (DESYREL) 50 MG tablet Take 1 tablet by mouth at bedtime as needed.    . valACYclovir (VALTREX) 500 MG tablet Take 500 mg by mouth daily.    . vitamin B-12 (CYANOCOBALAMIN) 1000 MCG tablet Take by mouth.    . warfarin (COUMADIN) 2 MG tablet TAKE 1 TABLET BY MOUTH EVERY DAY 90 tablet 1   No current facility-administered medications for this visit.    SURGICAL HISTORY:  Past Surgical History:  Procedure Laterality Date  . CHONDROPLASTY Left 07/09/2016   Procedure: CHONDROPLASTY with MICROFRACTURE;  Surgeon: Leandrew Koyanagi, MD;  Location: [place];  Service: Orthopedics;  Laterality: Left;  . COLONOSCOPY     x2  . HIP SURGERY     aspiration only, no anesthesia  . KNEE ARTHROSCOPY WITH MEDIAL MENISECTOMY Left 07/09/2016   Procedure: LEFT KNEE ARTHROSCOPY WITH PARTIAL MEDIAL MENISCECTOMY;  Surgeon: Leandrew Koyanagi, MD;  Location: [place];  Service: Orthopedics;  Laterality: Left;    REVIEW OF SYSTEMS:   Review of Systems  Constitutional: Positive for fatigue and decreased appetite. Negative for chills, fever and unexpected weight change.  HENT: Negative for mouth sores, nosebleeds, sore throat and trouble swallowing.   Eyes: Negative for eye problems and icterus.  Respiratory: Negative for cough, hemoptysis, shortness of breath and wheezing.   Cardiovascular: Negative for chest pain and leg swelling.  Gastrointestinal: Positive for occasional diarrhea 1x per week. Negative for abdominal pain, constipation, nausea and vomiting.  Genitourinary: Negative for bladder incontinence, difficulty urinating, dysuria, frequency and hematuria.   Musculoskeletal: Negative for back pain, gait problem, neck pain and neck stiffness.  Skin: Negative for itching and rash.   Neurological: Negative for dizziness, extremity weakness, gait problem, headaches, light-headedness and seizures.  Hematological: Negative for adenopathy. Does not bruise/bleed easily.  Psychiatric/Behavioral: Positive for anxiety. Negative for confusion, depression and sleep disturbance.   PHYSICAL EXAMINATION:  Blood pressure (!) 162/85, pulse 88, temperature 98.5 F (36.9 C), temperature source Tympanic, resp. rate 18, height _0  (1.905 m), weight 253 lb 3.2 oz (114.9 kg), SpO2 100 %.  ECOG PERFORMANCE STATUS: 1 - Symptomatic but completely ambulatory  Physical Exam  Constitutional: Oriented to person, place, and time and well-developed, well-nourished, and in no distress.  HENT:  Head: Normocephalic and atraumatic.  Mouth/Throat: Oropharynx is clear and moist. No oropharyngeal exudate.  Eyes: Conjunctivae are normal. Right eye exhibits no discharge. Left eye exhibits no discharge. No scleral icterus.  Neck: Normal range of motion. Neck supple.  Cardiovascular: Normal rate, regular rhythm, normal heart sounds and intact distal pulses.   Pulmonary/Chest: Effort normal and breath sounds normal. No respiratory distress. No wheezes. No rales.  Abdominal: Soft. Bowel sounds are normal. Exhibits no distension and no mass. There is no tenderness.  Musculoskeletal: Normal range of motion. Exhibits no edema.  Lymphadenopathy:    No cervical adenopathy.  Neurological: Alert and oriented to person, place, and time. Exhibits normal muscle tone. Gait normal. Coordination normal.  Skin: Skin is warm and dry. No rash noted. Not diaphoretic. No erythema. No pallor.  Psychiatric: Mood, memory and judgment normal.  Vitals reviewed.  LABORATORY DATA: Lab Results  Component Value Date   WBC 4.9 03/15/2020   HGB 14.2 03/15/2020   HCT 41.4 03/15/2020   MCV 101.0 (H) 03/15/2020   PLT 149 (L) 03/15/2020      Chemistry      Component Value Date/Time   NA 139 03/15/2020 1002   K 3.9  03/15/2020 1002   CL 104 03/15/2020 1002   CO2 29 03/15/2020 1002   BUN 12 03/15/2020 1002   CREATININE 1.27 (H) 03/15/2020 1002      Component Value Date/Time   CALCIUM 9.2 03/15/2020 1002   ALKPHOS 104 03/15/2020 1002   AST 19 03/15/2020 1002   ALT 26 03/15/2020 1002   BILITOT 0.6 03/15/2020 1002       RADIOGRAPHIC STUDIES:  No results found.   ASSESSMENT/PLAN:  This is a very pleasant 70 year old African-American male diagnosed with multiple myeloma, IgG subtype. He tolerated this well except he developed peripheral neuropathy.He was diagnosed in January 2020. He completed treatment with Revlimid, Velcade and Decadron status post 6 cycles.   On October 08, 2018 he underwent autologous stem cell transplant at [facility] and tolerated the procedure well.   He is currently on maintenance treatment with Revlimid 10 mg p.o. daily status post 9 months of treatment. He tolerates this well except for manageable diarrhea and fatigue.  Dr. Leida Lauth recommended keeping dose to 10 mg p.o. for now due to borderline neutropenia but would maybe recommend increasing to 15 mg in the future.   The patient recently had a repeat CBC, CMP, LDH, beta-2 microglobulin, quantitative immunoglobulins, SPEP with immunofixation, and kappa/lambda light chains.  Dr. Earlie Server personally and independently reviewed the results and discussed the results with the patient today.  The results showed some improvement in his labs.  Dr. Julien Nordmann recommends continuing on the same treatment at the same dose.   We will see the patient back for follow-up visit in 4 weeks for evaluation and repeat blood work.  We will arrange for Zometa infusions every 4 weeks for a total of 2 years once the patient obtains dental clearance. We will plan on starting this next month. He was instructed to take calcium and vitamin D.   Per Dr. Edson Snowball recommendations, the patient will continue taking 81 mg aspirin daily.  Dr.  Edson Snowball note indicates that if the patient develops disease progression in the future that he would recommend changing his treatment to daratumumab-based therapy (combined with either carfilzomib or pomalidomide.  The patient was advised to call immediately if he has any concerning symptoms in the interval. The patient voices understanding of current disease status and treatment options and is in agreement with the current care plan. All questions were answered. The patient knows to call the clinic with any problems, questions or concerns. We can certainly see the patient much sooner if necessary   Orders Placed This Encounter  Procedures  . CBC with Differential (Cancer Center Only)    Standing Status:   Future    Standing Expiration Date:   03/22/2021  . CMP ([place] only)    Standing Status:   Future    Standing Expiration Date:   03/22/2021  .  Lactate dehydrogenase (LDH)    Standing Status:   Future    Standing Expiration Date:   03/22/2021  . SPEP with reflex to IFE    Standing Status:   Future    Standing Expiration Date:   03/22/2021     I spent 20-29 minutes in this encounter.   Tynika Luddy L Tashona Calk, PA-C 03/22/20  ADDENDUM: Hematology/Oncology Attending: I had a face-to-face encounter with the patient today.  I reviewed his record and recommended his care plan.  This is a very pleasant 70 years old African-American male diagnosed with multiple myeloma, IgG subtype and January 2020 status post induction treatment with Revlimid, Velcade and Decadron for 6 cycles followed by autologous stem cell transplant on October 08, 2018. The patient is currently on maintenance treatment with Revlimid 10 mg p.o. daily and has been tolerating this treatment well except for chemotherapy-induced neutropenia. He had repeat myeloma panel performed recently.  I discussed the lab results with the patient.  His myeloma panel showed improvement of the free kappa light chain down to  60.6. I recommended for the patient to continue his current treatment with Revlimid 10 mg p.o. daily for now.  We may consider increasing his dose to 15 mg p.o. daily once his neutropenia is better. For the bone health, we will start the patient on Zometa 4 mg IV on monthly basis.  The patient had full dental work-up before his transplant and he has no issues. He will continue on aspirin 325 mg p.o. daily for DVT prophylaxis. The patient will come back for follow-up visit in 1 months for evaluation and repeat blood work. The patient was advised to call immediately if he has any concerning symptoms in the interval.  Disclaimer: This note was dictated with voice recognition software. Similar sounding words can inadvertently be transcribed and may be missed upon review. Eilleen Kempf, MD 03/22/20

## 2020-03-17 LAB — PROTEIN ELECTROPHORESIS, SERUM, WITH REFLEX
A/G Ratio: 1.1 (ref 0.7–1.7)
Albumin ELP: 3.8 g/dL (ref 2.9–4.4)
Alpha-1-Globulin: 0.2 g/dL (ref 0.0–0.4)
Alpha-2-Globulin: 0.7 g/dL (ref 0.4–1.0)
Beta Globulin: 0.9 g/dL (ref 0.7–1.3)
Gamma Globulin: 1.6 g/dL (ref 0.4–1.8)
Globulin, Total: 3.4 g/dL (ref 2.2–3.9)
Total Protein ELP: 7.2 g/dL (ref 6.0–8.5)

## 2020-03-22 ENCOUNTER — Inpatient Hospital Stay (HOSPITAL_BASED_OUTPATIENT_CLINIC_OR_DEPARTMENT_OTHER): Payer: 59 | Admitting: Physician Assistant

## 2020-03-22 ENCOUNTER — Other Ambulatory Visit: Payer: Self-pay

## 2020-03-22 VITALS — BP 162/85 | HR 88 | Temp 98.5°F | Resp 18 | Ht 75.0 in | Wt 253.2 lb

## 2020-03-22 DIAGNOSIS — Z5111 Encounter for antineoplastic chemotherapy: Secondary | ICD-10-CM

## 2020-03-22 DIAGNOSIS — C9001 Multiple myeloma in remission: Secondary | ICD-10-CM

## 2020-03-22 DIAGNOSIS — C9000 Multiple myeloma not having achieved remission: Secondary | ICD-10-CM | POA: Diagnosis not present

## 2020-03-23 ENCOUNTER — Other Ambulatory Visit: Payer: Self-pay | Admitting: Internal Medicine

## 2020-03-23 ENCOUNTER — Telehealth: Payer: Self-pay | Admitting: Physician Assistant

## 2020-03-23 DIAGNOSIS — C9001 Multiple myeloma in remission: Secondary | ICD-10-CM

## 2020-03-23 NOTE — Telephone Encounter (Signed)
Scheduled appointments per 2/17 los. Spoke to patient who is aware of appointments date and times.  °

## 2020-04-04 ENCOUNTER — Telehealth: Payer: Self-pay | Admitting: Medical Oncology

## 2020-04-04 NOTE — Telephone Encounter (Signed)
dental clearance for Zometa- does he need dental clearance before starting or will the note from [facility] suffice ?  09/24/18-dental extractions before transplant -[facility] Dr. Marcelene Butte .

## 2020-04-04 NOTE — Telephone Encounter (Signed)
[facility] note should be fine unless he can get a quick visit with his dentist. Thank you

## 2020-04-04 NOTE — Telephone Encounter (Signed)
Pt.notified

## 2020-04-19 ENCOUNTER — Telehealth: Payer: Self-pay | Admitting: Medical Oncology

## 2020-04-19 ENCOUNTER — Inpatient Hospital Stay (HOSPITAL_BASED_OUTPATIENT_CLINIC_OR_DEPARTMENT_OTHER): Payer: 59 | Admitting: Internal Medicine

## 2020-04-19 ENCOUNTER — Other Ambulatory Visit: Payer: Self-pay | Admitting: Medical Oncology

## 2020-04-19 ENCOUNTER — Inpatient Hospital Stay: Payer: 59 | Attending: Internal Medicine

## 2020-04-19 ENCOUNTER — Other Ambulatory Visit: Payer: Self-pay

## 2020-04-19 ENCOUNTER — Inpatient Hospital Stay: Payer: 59

## 2020-04-19 VITALS — BP 131/71 | HR 78 | Temp 97.8°F | Resp 18 | Ht 75.0 in | Wt 251.0 lb

## 2020-04-19 DIAGNOSIS — I10 Essential (primary) hypertension: Secondary | ICD-10-CM | POA: Diagnosis not present

## 2020-04-19 DIAGNOSIS — Z79899 Other long term (current) drug therapy: Secondary | ICD-10-CM | POA: Diagnosis not present

## 2020-04-19 DIAGNOSIS — Z5111 Encounter for antineoplastic chemotherapy: Secondary | ICD-10-CM | POA: Diagnosis not present

## 2020-04-19 DIAGNOSIS — Z9484 Stem cells transplant status: Secondary | ICD-10-CM | POA: Insufficient documentation

## 2020-04-19 DIAGNOSIS — C9001 Multiple myeloma in remission: Secondary | ICD-10-CM

## 2020-04-19 DIAGNOSIS — C9000 Multiple myeloma not having achieved remission: Secondary | ICD-10-CM | POA: Diagnosis present

## 2020-04-19 DIAGNOSIS — Z7901 Long term (current) use of anticoagulants: Secondary | ICD-10-CM | POA: Insufficient documentation

## 2020-04-19 LAB — CBC WITH DIFFERENTIAL (CANCER CENTER ONLY)
Abs Immature Granulocytes: 0.01 10*3/uL (ref 0.00–0.07)
Basophils Absolute: 0.1 10*3/uL (ref 0.0–0.1)
Basophils Relative: 2 %
Eosinophils Absolute: 0.5 10*3/uL (ref 0.0–0.5)
Eosinophils Relative: 11 %
HCT: 39.3 % (ref 39.0–52.0)
Hemoglobin: 13.6 g/dL (ref 13.0–17.0)
Immature Granulocytes: 0 %
Lymphocytes Relative: 30 %
Lymphs Abs: 1.4 10*3/uL (ref 0.7–4.0)
MCH: 34.2 pg — ABNORMAL HIGH (ref 26.0–34.0)
MCHC: 34.6 g/dL (ref 30.0–36.0)
MCV: 98.7 fL (ref 80.0–100.0)
Monocytes Absolute: 0.6 10*3/uL (ref 0.1–1.0)
Monocytes Relative: 13 %
Neutro Abs: 2.1 10*3/uL (ref 1.7–7.7)
Neutrophils Relative %: 44 %
Platelet Count: 164 10*3/uL (ref 150–400)
RBC: 3.98 MIL/uL — ABNORMAL LOW (ref 4.22–5.81)
RDW: 13.7 % (ref 11.5–15.5)
WBC Count: 4.7 10*3/uL (ref 4.0–10.5)
nRBC: 0 % (ref 0.0–0.2)

## 2020-04-19 LAB — CMP (CANCER CENTER ONLY)
ALT: 43 U/L (ref 0–44)
AST: 24 U/L (ref 15–41)
Albumin: 3.8 g/dL (ref 3.5–5.0)
Alkaline Phosphatase: 101 U/L (ref 38–126)
Anion gap: 6 (ref 5–15)
BUN: 10 mg/dL (ref 8–23)
CO2: 26 mmol/L (ref 22–32)
Calcium: 9.1 mg/dL (ref 8.9–10.3)
Chloride: 106 mmol/L (ref 98–111)
Creatinine: 1.27 mg/dL — ABNORMAL HIGH (ref 0.61–1.24)
GFR, Estimated: >60 mL/min (ref >60)
Glucose, Bld: 105 mg/dL — ABNORMAL HIGH (ref 70–99)
Potassium: 3.4 mmol/L — ABNORMAL LOW (ref 3.5–5.1)
Sodium: 138 mmol/L (ref 135–145)
Total Bilirubin: 0.9 mg/dL (ref 0.3–1.2)
Total Protein: 7.4 g/dL (ref 6.5–8.1)

## 2020-04-19 LAB — LACTATE DEHYDROGENASE: LDH: 139 U/L (ref 98–192)

## 2020-04-19 MED ORDER — LENALIDOMIDE 10 MG PO CAPS: ORAL_CAPSULE | ORAL | 0 refills | Status: DC

## 2020-04-19 MED ORDER — ZOLEDRONIC ACID 4 MG/100ML IV SOLN
4.0000 mg | Freq: Once | INTRAVENOUS | Status: AC
  Administered 2020-04-19: 4 mg via INTRAVENOUS

## 2020-04-19 MED ORDER — ZOLEDRONIC ACID 4 MG/100ML IV SOLN
INTRAVENOUS | Status: AC
  Filled 2020-04-19: qty 100

## 2020-04-19 MED ORDER — SODIUM CHLORIDE 0.9 % IV SOLN
Freq: Once | INTRAVENOUS | Status: AC
  Filled 2020-04-19: qty 250

## 2020-04-19 NOTE — Telephone Encounter (Signed)
Revlimid refill faxed to [facility].

## 2020-04-19 NOTE — Progress Notes (Signed)
[place] [phone]   [phone]  OFFICE PROGRESS NOTE  Derinda Late, MD [phone] [address] [facility] [place] [address]  DIAGNOSIS: Multiple myeloma, IgG subtype diagnosed in January 2020.  PRIOR THERAPY:  1) Systemic chemotherapy with Velcade 1.3 mg/M2 on days 1, 8, 15 as well as Revlimid 25 mg p.o. daily for 21 days every 4 weeks as well as Decadron 40 mg weekly.  First dose expected March 22, 2018.  Status post 6 cycles. 2) autologous stem cell transplant at [facility] on 10/08/2018.  CURRENT THERAPY:   1) Maintenance Revlimid 10 mg p.o. daily.   2) Zometa infusion every 3 months.  INTERVAL HISTORY: Matthew Yates 70 y.o. male returns to the clinic today for follow-up visit.  The patient is feeling fine today with no concerning complaints.  He denied having any chest pain, shortness of breath, cough or hemoptysis.  He denied having any fever or chills.  He has no nausea, vomiting, diarrhea or constipation.  He has no headache or visual changes.  He continues to tolerate his maintenance treatment with Revlimid fairly well.  He is here today for evaluation and repeat blood work as well as Zometa infusion.   MEDICAL HISTORY: Past Medical History:  Diagnosis Date  . Acute medial meniscus tear of left knee   . Arthritis    lt knee  . GERD (gastroesophageal reflux disease)   . Hypertension     ALLERGIES:  is allergic to other.  MEDICATIONS:  Current Outpatient Medications  Medication Sig Dispense Refill  . ALPRAZolam (XANAX) 0.25 MG tablet Take 1 tablet (0.25 mg total) by mouth at bedtime as needed for anxiety. 30 tablet 0  . amLODipine (NORVASC) 5 MG tablet Take 5 mg by mouth daily.    . clonazePAM (KLONOPIN) 0.5 MG tablet Take 1 tablet by mouth 2 (two) times daily as needed.    Marland Kitchen esomeprazole (NEXIUM) 20 MG capsule Take by mouth.    . gabapentin (NEURONTIN) 300 MG capsule Take 1  capsule (300 mg total) by mouth 3 (three) times daily. 90 capsule 2  . HYDROcodone-acetaminophen (NORCO) 7.5-325 MG tablet Take 1-2 tablets by mouth every 6 (six) hours as needed for moderate pain. 90 tablet 0  . ibuprofen (ADVIL,MOTRIN) 400 MG tablet Take 400 mg by mouth every 6 (six) hours as needed.    Marland Kitchen lenalidomide (REVLIMID) 10 MG capsule TAKE 1 CAPSULE DAILY, CONTINUOUSLY Authorization number [id number] 03/26/20 adult male 28 capsule 0  . loratadine-pseudoephedrine (CLARITIN-D 12-HOUR) 5-120 MG tablet Take 1 tablet by mouth 2 (two) times daily.    Marland Kitchen LORazepam (ATIVAN) 0.5 MG tablet Take by mouth.    . ondansetron (ZOFRAN) 4 MG tablet Take 1-2 tablets (4-8 mg total) by mouth every 8 (eight) hours as needed for nausea or vomiting. 40 tablet 0  . prochlorperazine (COMPAZINE) 10 MG tablet Take 1 tablet (10 mg total) by mouth every 6 (six) hours as needed for nausea or vomiting. 30 tablet 0  . senna-docusate (SENOKOT S) 8.6-50 MG tablet Take 1 tablet by mouth at bedtime as needed. 30 tablet 1  . traZODone (DESYREL) 50 MG tablet Take 1 tablet by mouth at bedtime as needed.    . valACYclovir (VALTREX) 500 MG tablet Take 500 mg by mouth daily.    . vitamin B-12 (CYANOCOBALAMIN) 1000 MCG tablet Take by mouth.    . warfarin (COUMADIN) 2 MG tablet TAKE  1 TABLET BY MOUTH EVERY DAY 90 tablet 1   No current facility-administered medications for this visit.    SURGICAL HISTORY:  Past Surgical History:  Procedure Laterality Date  . CHONDROPLASTY Left 07/09/2016   Procedure: CHONDROPLASTY with MICROFRACTURE;  Surgeon: Leandrew Koyanagi, MD;  Location: [place];  Service: Orthopedics;  Laterality: Left;  . COLONOSCOPY     x2  . HIP SURGERY     aspiration only, no anesthesia  . KNEE ARTHROSCOPY WITH MEDIAL MENISECTOMY Left 07/09/2016   Procedure: LEFT KNEE ARTHROSCOPY WITH PARTIAL MEDIAL MENISCECTOMY;  Surgeon: Leandrew Koyanagi, MD;  Location: [place];  Service: Orthopedics;   Laterality: Left;    REVIEW OF SYSTEMS:  A comprehensive review of systems was negative except for: Constitutional: positive for fatigue   PHYSICAL EXAMINATION: General appearance: alert, cooperative, fatigued and no distress Head: Normocephalic, without obvious abnormality, atraumatic Neck: no adenopathy, no JVD, supple, symmetrical, trachea midline and thyroid not enlarged, symmetric, no tenderness/mass/nodules Lymph nodes: Cervical, supraclavicular, and axillary nodes normal. Resp: clear to auscultation bilaterally Back: symmetric, no curvature. ROM normal. No CVA tenderness. Cardio: regular rate and rhythm, S1, S2 normal, no murmur, click, rub or gallop GI: soft, non-tender; bowel sounds normal; no masses,  no organomegaly Extremities: extremities normal, atraumatic, no cyanosis or edema  ECOG PERFORMANCE STATUS: 1 - Symptomatic but completely ambulatory  Blood pressure 131/71, pulse 78, temperature 97.8 F (36.6 C), temperature source Tympanic, resp. rate 18, height 6' 3"  (1.905 m), weight 251 lb (113.9 kg), SpO2 99 %.  LABORATORY DATA: Lab Results  Component Value Date   WBC 4.7 04/19/2020   HGB 13.6 04/19/2020   HCT 39.3 04/19/2020   MCV 98.7 04/19/2020   PLT 164 04/19/2020      Chemistry      Component Value Date/Time   NA 139 03/15/2020 1002   K 3.9 03/15/2020 1002   CL 104 03/15/2020 1002   CO2 29 03/15/2020 1002   BUN 12 03/15/2020 1002   CREATININE 1.27 (H) 03/15/2020 1002      Component Value Date/Time   CALCIUM 9.2 03/15/2020 1002   ALKPHOS 104 03/15/2020 1002   AST 19 03/15/2020 1002   ALT 26 03/15/2020 1002   BILITOT 0.6 03/15/2020 1002       RADIOGRAPHIC STUDIES: No results found.  ASSESSMENT AND PLAN: This is a very pleasant 70 years old African-American male recently diagnosed with multiple myeloma, IgG subtype.  He is currently undergoing treatment with Revlimid, Velcade and Decadron status post 6 cycles. The patient continues to tolerate  this treatment well with no concerning adverse effect except for mild peripheral neuropathy. The patient has very good response to this treatment. On October 08, 2018 he underwent autologous stem cell transplant at [facility] and tolerated the procedure well  The patient is currently on maintenance treatment with Revlimid 10 mg p.o. daily status post 14 months of treatment. The patient has been tolerating this treatment well with no concerning adverse effects.  His lab work is stable. I recommended for the patient to continue his current treatment with Revlimid. He will proceed with first dose of Zometa infusion today. I will see him back for follow-up visit in 6 weeks for evaluation and repeat blood work. For the peripheral neuropathy, he will continue his current treatment with gabapentin. He was advised to call immediately if he has any concerning symptoms in the interval. The patient voices understanding of current disease status and treatment options  and is in agreement with the current care plan. All questions were answered. The patient knows to call the clinic with any problems, questions or concerns. We can certainly see the patient much sooner if necessary.    Disclaimer: This note was dictated with voice recognition software. Similar sounding words can inadvertently be transcribed and may not be corrected upon review.

## 2020-04-19 NOTE — Patient Instructions (Signed)
Zoledronic Acid Injection (Hypercalcemia, Oncology) What is this medicine? ZOLEDRONIC ACID (ZOE le dron ik AS id) slows calcium loss from bones. It high calcium levels in the blood from some kinds of cancer. It may be used in other people at risk for bone loss. This medicine may be used for other purposes; ask your health care provider or pharmacist if you have questions. COMMON BRAND NAME(S): Zometa What should I tell my health care provider before I take this medicine? They need to know if you have any of these conditions:  cancer  dehydration  dental disease  kidney disease  liver disease  low levels of calcium in the blood  lung or breathing disease (asthma)  receiving steroids like dexamethasone or prednisone  an unusual or allergic reaction to zoledronic acid, other medicines, foods, dyes, or preservatives  pregnant or trying to get pregnant  breast-feeding How should I use this medicine? This drug is injected into a vein. It is given by a health care provider in a hospital or clinic setting. Talk to your health care provider about the use of this drug in children. Special care may be needed. Overdosage: If you think you have taken too much of this medicine contact a poison control center or emergency room at once. NOTE: This medicine is only for you. Do not share this medicine with others. What if I miss a dose? Keep appointments for follow-up doses. It is important not to miss your dose. Call your health care provider if you are unable to keep an appointment. What may interact with this medicine?  certain antibiotics given by injection  NSAIDs, medicines for pain and inflammation, like ibuprofen or naproxen  some diuretics like bumetanide, furosemide  teriparatide  thalidomide This list may not describe all possible interactions. Give your health care provider a list of all the medicines, herbs, non-prescription drugs, or dietary supplements you use. Also tell  them if you smoke, drink alcohol, or use illegal drugs. Some items may interact with your medicine. What should I watch for while using this medicine? Visit your health care provider for regular checks on your progress. It may be some time before you see the benefit from this drug. Some people who take this drug have severe bone, joint, or muscle pain. This drug may also increase your risk for jaw problems or a broken thigh bone. Tell your health care provider right away if you have severe pain in your jaw, bones, joints, or muscles. Tell you health care provider if you have any pain that does not go away or that gets worse. Tell your dentist and dental surgeon that you are taking this drug. You should not have major dental surgery while on this drug. See your dentist to have a dental exam and fix any dental problems before starting this drug. Take good care of your teeth while on this drug. Make sure you see your dentist for regular follow-up appointments. You should make sure you get enough calcium and vitamin D while you are taking this drug. Discuss the foods you eat and the vitamins you take with your health care provider. Check with your health care provider if you have severe diarrhea, nausea, and vomiting, or if you sweat a lot. The loss of too much body fluid may make it dangerous for you to take this drug. You may need blood work done while you are taking this drug. Do not become pregnant while taking this drug. Women should inform their health care provider   if they wish to become pregnant or think they might be pregnant. There is potential for serious harm to an unborn child. Talk to your health care provider for more information. What side effects may I notice from receiving this medicine? Side effects that you should report to your doctor or health care provider as soon as possible:  allergic reactions (skin rash, itching or hives; swelling of the face, lips, or tongue)  bone  pain  infection (fever, chills, cough, sore throat, pain or trouble passing urine)  jaw pain, especially after dental work  joint pain  kidney injury (trouble passing urine or change in the amount of urine)  low blood pressure (dizziness; feeling faint or lightheaded, falls; unusually weak or tired)  low calcium levels (fast heartbeat; muscle cramps or pain; pain, tingling, or numbness in the hands or feet; seizures)  low magnesium levels (fast, irregular heartbeat; muscle cramp or pain; muscle weakness; tremors; seizures)  low red blood cell counts (trouble breathing; feeling faint; lightheaded, falls; unusually weak or tired)  muscle pain  redness, blistering, peeling, or loosening of the skin, including inside the mouth  severe diarrhea  swelling of the ankles, feet, hands  trouble breathing Side effects that usually do not require medical attention (report to your doctor or health care provider if they continue or are bothersome):  anxious  constipation  coughing  depressed mood  eye irritation, itching, or pain  fever  general ill feeling or flu-like symptoms  nausea  pain, redness, or irritation at site where injected  trouble sleeping This list may not describe all possible side effects. Call your doctor for medical advice about side effects. You may report side effects to FDA at 1-800-FDA-1088. Where should I keep my medicine? This drug is given in a hospital or clinic. It will not be stored at home. NOTE: This sheet is a summary. It may not cover all possible information. If you have questions about this medicine, talk to your doctor, pharmacist, or health care provider.  2021 Elsevier/Gold Standard (2018-11-04 09:13:00)  

## 2020-04-23 LAB — PROTEIN ELECTROPHORESIS, SERUM, WITH REFLEX
A/G Ratio: 1.1 (ref 0.7–1.7)
Albumin ELP: 3.6 g/dL (ref 2.9–4.4)
Alpha-1-Globulin: 0.3 g/dL (ref 0.0–0.4)
Alpha-2-Globulin: 0.7 g/dL (ref 0.4–1.0)
Beta Globulin: 0.9 g/dL (ref 0.7–1.3)
Gamma Globulin: 1.3 g/dL (ref 0.4–1.8)
Globulin, Total: 3.2 g/dL (ref 2.2–3.9)
Total Protein ELP: 6.8 g/dL (ref 6.0–8.5)

## 2020-04-30 ENCOUNTER — Telehealth: Payer: Self-pay | Admitting: Internal Medicine

## 2020-04-30 NOTE — Telephone Encounter (Signed)
Scheduled appts per 3/17 los. Called and spoke with patient. Confirmed appts

## 2020-05-17 ENCOUNTER — Other Ambulatory Visit: Payer: Self-pay | Admitting: Internal Medicine

## 2020-05-17 DIAGNOSIS — C9001 Multiple myeloma in remission: Secondary | ICD-10-CM

## 2020-05-17 MED ORDER — LENALIDOMIDE 10 MG PO CAPS: ORAL_CAPSULE | ORAL | 0 refills | Status: DC

## 2020-05-17 NOTE — Addendum Note (Signed)
Addended by: Ardeen Garland on: 05/17/2020 11:00 AM   Modules accepted: Orders

## 2020-05-29 ENCOUNTER — Telehealth: Payer: Self-pay | Admitting: Internal Medicine

## 2020-05-29 NOTE — Telephone Encounter (Signed)
R/s 5/2 appt per provider request. Called and spoke with patient. Confirmed appt  

## 2020-06-04 ENCOUNTER — Other Ambulatory Visit: Payer: Self-pay

## 2020-06-04 ENCOUNTER — Ambulatory Visit: Payer: 59 | Admitting: Internal Medicine

## 2020-06-04 ENCOUNTER — Ambulatory Visit: Payer: 59 | Attending: Internal Medicine

## 2020-06-04 ENCOUNTER — Other Ambulatory Visit (HOSPITAL_BASED_OUTPATIENT_CLINIC_OR_DEPARTMENT_OTHER): Payer: Self-pay

## 2020-06-04 ENCOUNTER — Other Ambulatory Visit: Payer: 59

## 2020-06-04 DIAGNOSIS — Z23 Encounter for immunization: Secondary | ICD-10-CM

## 2020-06-04 MED ORDER — COVID-19 MRNA VACC (MODERNA) 100 MCG/0.5ML IM SUSP
INTRAMUSCULAR | 0 refills | Status: DC
  Filled 2020-06-04: qty 0.25, 1d supply, fill #0

## 2020-06-04 NOTE — Progress Notes (Signed)
   Covid-19 Vaccination Clinic  Name:  Matthew Yates    MRN: [id number] DOB: 1950/06/24  06/04/2020  [name] was observed post Covid-19 immunization for 15 minutes without incident. He was provided with Vaccine Information Sheet and instruction to access the V-Safe system.   [name] was instructed to call 911 with any severe reactions post vaccine: Marland Kitchen Difficulty breathing  . Swelling of face and throat  . A fast heartbeat  . A bad rash all over body  . Dizziness and weakness   Immunizations Administered    Name Date Dose VIS Date Route   Moderna Covid-19 Booster Vaccine 06/04/2020 11:02 AM 0.25 mL 11/23/2019 Intramuscular   Manufacturer: Moderna   Lot: [id number]   [place]: [id number]

## 2020-06-07 ENCOUNTER — Other Ambulatory Visit: Payer: Self-pay

## 2020-06-07 ENCOUNTER — Inpatient Hospital Stay: Payer: 59 | Attending: Internal Medicine

## 2020-06-07 ENCOUNTER — Inpatient Hospital Stay (HOSPITAL_BASED_OUTPATIENT_CLINIC_OR_DEPARTMENT_OTHER): Payer: 59 | Admitting: Internal Medicine

## 2020-06-07 VITALS — BP 130/82 | HR 78 | Temp 97.7°F | Resp 20 | Ht 75.0 in | Wt 243.5 lb

## 2020-06-07 DIAGNOSIS — T451X5A Adverse effect of antineoplastic and immunosuppressive drugs, initial encounter: Secondary | ICD-10-CM | POA: Insufficient documentation

## 2020-06-07 DIAGNOSIS — R630 Anorexia: Secondary | ICD-10-CM | POA: Diagnosis not present

## 2020-06-07 DIAGNOSIS — C9000 Multiple myeloma not having achieved remission: Secondary | ICD-10-CM | POA: Insufficient documentation

## 2020-06-07 DIAGNOSIS — Z9484 Stem cells transplant status: Secondary | ICD-10-CM | POA: Diagnosis not present

## 2020-06-07 DIAGNOSIS — Z79899 Other long term (current) drug therapy: Secondary | ICD-10-CM | POA: Insufficient documentation

## 2020-06-07 DIAGNOSIS — R5383 Other fatigue: Secondary | ICD-10-CM

## 2020-06-07 DIAGNOSIS — R197 Diarrhea, unspecified: Secondary | ICD-10-CM | POA: Insufficient documentation

## 2020-06-07 DIAGNOSIS — Z5111 Encounter for antineoplastic chemotherapy: Secondary | ICD-10-CM

## 2020-06-07 DIAGNOSIS — G620 Drug-induced polyneuropathy: Secondary | ICD-10-CM

## 2020-06-07 DIAGNOSIS — C9001 Multiple myeloma in remission: Secondary | ICD-10-CM

## 2020-06-07 LAB — CMP (CANCER CENTER ONLY)
ALT: 23 U/L (ref 0–44)
AST: 19 U/L (ref 15–41)
Albumin: 3.8 g/dL (ref 3.5–5.0)
Alkaline Phosphatase: 95 U/L (ref 38–126)
Anion gap: 8 (ref 5–15)
BUN: 12 mg/dL (ref 8–23)
CO2: 26 mmol/L (ref 22–32)
Calcium: 8.6 mg/dL — ABNORMAL LOW (ref 8.9–10.3)
Chloride: 106 mmol/L (ref 98–111)
Creatinine: 1.23 mg/dL (ref 0.61–1.24)
GFR, Estimated: >60 mL/min (ref >60)
Glucose, Bld: 97 mg/dL (ref 70–99)
Potassium: 3.8 mmol/L (ref 3.5–5.1)
Sodium: 140 mmol/L (ref 135–145)
Total Bilirubin: 0.6 mg/dL (ref 0.3–1.2)
Total Protein: 7.2 g/dL (ref 6.5–8.1)

## 2020-06-07 LAB — CBC WITH DIFFERENTIAL (CANCER CENTER ONLY)
Abs Immature Granulocytes: 0 10*3/uL (ref 0.00–0.07)
Basophils Absolute: 0.1 10*3/uL (ref 0.0–0.1)
Basophils Relative: 2 %
Eosinophils Absolute: 0.5 10*3/uL (ref 0.0–0.5)
Eosinophils Relative: 12 %
HCT: 40 % (ref 39.0–52.0)
Hemoglobin: 13.8 g/dL (ref 13.0–17.0)
Immature Granulocytes: 0 %
Lymphocytes Relative: 49 %
Lymphs Abs: 1.9 10*3/uL (ref 0.7–4.0)
MCH: 34.8 pg — ABNORMAL HIGH (ref 26.0–34.0)
MCHC: 34.5 g/dL (ref 30.0–36.0)
MCV: 101 fL — ABNORMAL HIGH (ref 80.0–100.0)
Monocytes Absolute: 0.6 10*3/uL (ref 0.1–1.0)
Monocytes Relative: 15 %
Neutro Abs: 0.8 10*3/uL — ABNORMAL LOW (ref 1.7–7.7)
Neutrophils Relative %: 22 %
Platelet Count: 156 10*3/uL (ref 150–400)
RBC: 3.96 MIL/uL — ABNORMAL LOW (ref 4.22–5.81)
RDW: 14.3 % (ref 11.5–15.5)
WBC Count: 3.8 10*3/uL — ABNORMAL LOW (ref 4.0–10.5)
nRBC: 0 % (ref 0.0–0.2)

## 2020-06-07 LAB — LACTATE DEHYDROGENASE: LDH: 117 U/L (ref 98–192)

## 2020-06-07 NOTE — Progress Notes (Signed)
[place] Telephone:(336) [phone]   Fax:(336) [phone]  OFFICE PROGRESS NOTE  Derinda Late, MD [phone] [address] [facility] [place] [address]  DIAGNOSIS: Multiple myeloma, IgG subtype diagnosed in January 2020.  PRIOR THERAPY:  1) Systemic chemotherapy with Velcade 1.3 mg/M2 on days 1, 8, 15 as well as Revlimid 25 mg p.o. daily for 21 days every 4 weeks as well as Decadron 40 mg weekly.  First dose expected March 22, 2018.  Status post 6 cycles. 2) autologous stem cell transplant at [facility] on 10/08/2018.  CURRENT THERAPY:   1) Maintenance Revlimid 10 mg p.o. daily.   2) Zometa infusion every 3 months.  INTERVAL HISTORY: Matthew Yates 70 y.o. male returns to the clinic today for follow-up visit.  The patient is feeling fine today with no concerning complaints except for mild fatigue and infrequent diarrhea.  He denied having any chest pain, shortness of breath, cough or hemoptysis.  He denied having any fever or chills.  He has no nausea, vomiting, diarrhea or constipation.  He has no headache or visual changes.  He lost few pounds since his last visit secondary to lack of appetite and the diarrhea. The patient continues to tolerate his treatment with Revlimid fairly well.  He is here today for evaluation and repeat blood work.  MEDICAL HISTORY: Past Medical History:  Diagnosis Date  . Acute medial meniscus tear of left knee   . Arthritis    lt knee  . GERD (gastroesophageal reflux disease)   . Hypertension     ALLERGIES:  is allergic to other.  MEDICATIONS:  Current Outpatient Medications  Medication Sig Dispense Refill  . ALPRAZolam (XANAX) 0.25 MG tablet Take 1 tablet (0.25 mg total) by mouth at bedtime as needed for anxiety. 30 tablet 0  . amLODipine (NORVASC) 5 MG tablet Take 5 mg by mouth daily.    . clonazePAM (KLONOPIN) 0.5 MG tablet Take 1 tablet by mouth 2 (two) times daily as needed.     Marland Kitchen COVID-19 mRNA vaccine, Moderna, 100 MCG/0.5ML injection Inject into the muscle. 0.25 mL 0  . esomeprazole (NEXIUM) 20 MG capsule Take by mouth.    . fluticasone (FLONASE) 50 MCG/ACT nasal spray Place 2 sprays into both nostrils daily.    Marland Kitchen HYDROcodone-acetaminophen (NORCO) 7.5-325 MG tablet Take 1-2 tablets by mouth every 6 (six) hours as needed for moderate pain. 90 tablet 0  . ibuprofen (ADVIL,MOTRIN) 400 MG tablet Take 400 mg by mouth every 6 (six) hours as needed.    Marland Kitchen lenalidomide (REVLIMID) 10 MG capsule TAKE 1 CAPSULE DAILY, CONTINUOUSLY. Adult male . [id number] number 28 capsule 0  . loratadine-pseudoephedrine (CLARITIN-D 12-HOUR) 5-120 MG tablet Take 1 tablet by mouth 2 (two) times daily.    Marland Kitchen LORazepam (ATIVAN) 0.5 MG tablet Take by mouth.    . ondansetron (ZOFRAN) 4 MG tablet Take 1-2 tablets (4-8 mg total) by mouth every 8 (eight) hours as needed for nausea or vomiting. 40 tablet 0  . prochlorperazine (COMPAZINE) 10 MG tablet Take 1 tablet (10 mg total) by mouth every 6 (six) hours as needed for nausea or vomiting. 30 tablet 0  . senna-docusate (SENOKOT S) 8.6-50 MG tablet Take 1 tablet by mouth at bedtime as needed. 30 tablet 1  . traZODone (DESYREL) 50 MG tablet Take 1 tablet by mouth at bedtime as needed.    . valACYclovir (VALTREX) 500 MG tablet Take 500  mg by mouth daily.    . vitamin B-12 (CYANOCOBALAMIN) 1000 MCG tablet Take by mouth.    . warfarin (COUMADIN) 2 MG tablet TAKE 1 TABLET BY MOUTH EVERY DAY 90 tablet 1  . chlorhexidine (PERIDEX) 0.12 % solution SMARTSIG:By Mouth     No current facility-administered medications for this visit.    SURGICAL HISTORY:  Past Surgical History:  Procedure Laterality Date  . CHONDROPLASTY Left 07/09/2016   Procedure: CHONDROPLASTY with MICROFRACTURE;  Surgeon: Leandrew Koyanagi, MD;  Location: [place];  Service: Orthopedics;  Laterality: Left;  . COLONOSCOPY     x2  . HIP SURGERY     aspiration only, no  anesthesia  . KNEE ARTHROSCOPY WITH MEDIAL MENISECTOMY Left 07/09/2016   Procedure: LEFT KNEE ARTHROSCOPY WITH PARTIAL MEDIAL MENISCECTOMY;  Surgeon: Leandrew Koyanagi, MD;  Location: [place];  Service: Orthopedics;  Laterality: Left;    REVIEW OF SYSTEMS:  A comprehensive review of systems was negative except for: Constitutional: positive for fatigue and weight loss   PHYSICAL EXAMINATION: General appearance: alert, cooperative, fatigued and no distress Head: Normocephalic, without obvious abnormality, atraumatic Neck: no adenopathy, no JVD, supple, symmetrical, trachea midline and thyroid not enlarged, symmetric, no tenderness/mass/nodules Lymph nodes: Cervical, supraclavicular, and axillary nodes normal. Resp: clear to auscultation bilaterally Back: symmetric, no curvature. ROM normal. No CVA tenderness. Cardio: regular rate and rhythm, S1, S2 normal, no murmur, click, rub or gallop GI: soft, non-tender; bowel sounds normal; no masses,  no organomegaly Extremities: extremities normal, atraumatic, no cyanosis or edema  ECOG PERFORMANCE STATUS: 1 - Symptomatic but completely ambulatory  Blood pressure 130/82, pulse 78, temperature 97.7 F (36.5 C), temperature source Tympanic, resp. rate 20, height _0  (1.905 m), weight 243 lb 8 oz (110.5 kg), SpO2 100 %.  LABORATORY DATA: Lab Results  Component Value Date   WBC 3.8 (L) 06/07/2020   HGB 13.8 06/07/2020   HCT 40.0 06/07/2020   MCV 101.0 (H) 06/07/2020   PLT 156 06/07/2020      Chemistry      Component Value Date/Time   NA 138 04/19/2020 0851   K 3.4 (L) 04/19/2020 0851   CL 106 04/19/2020 0851   CO2 26 04/19/2020 0851   BUN 10 04/19/2020 0851   CREATININE 1.27 (H) 04/19/2020 0851      Component Value Date/Time   CALCIUM 9.1 04/19/2020 0851   ALKPHOS 101 04/19/2020 0851   AST 24 04/19/2020 0851   ALT 43 04/19/2020 0851   BILITOT 0.9 04/19/2020 0851       RADIOGRAPHIC STUDIES: No results  found.  ASSESSMENT AND PLAN: This is a very pleasant 70 years old African-American male recently diagnosed with multiple myeloma, IgG subtype.  He is currently undergoing treatment with Revlimid, Velcade and Decadron status post 6 cycles. The patient continues to tolerate this treatment well with no concerning adverse effect except for mild peripheral neuropathy. The patient has very good response to this treatment. On October 08, 2018 he underwent autologous stem cell transplant at [facility] and tolerated the procedure well  The patient is currently on maintenance treatment with Revlimid 10 mg p.o. daily status post 17  months of treatment. The patient continues to tolerate his treatment with Revlimid fairly well except for mild fatigue and infrequent episodes of diarrhea. I recommended for him to continue his current treatment with Revlimid as planned. He will also continue on Zometa for the bone disease. For the peripheral neuropathy, he will  continue his current treatment with gabapentin. I will see him back for follow-up visit in 2 months for evaluation and repeat blood work. He was advised to call immediately if he has any concerning symptoms in the interval. The patient voices understanding of current disease status and treatment options and is in agreement with the current care plan. All questions were answered. The patient knows to call the clinic with any problems, questions or concerns. We can certainly see the patient much sooner if necessary.    Disclaimer: This note was dictated with voice recognition software. Similar sounding words can inadvertently be transcribed and may not be corrected upon review.

## 2020-06-09 ENCOUNTER — Other Ambulatory Visit: Payer: Self-pay | Admitting: Internal Medicine

## 2020-06-09 DIAGNOSIS — C9001 Multiple myeloma in remission: Secondary | ICD-10-CM

## 2020-06-11 LAB — PROTEIN ELECTROPHORESIS, SERUM, WITH REFLEX
A/G Ratio: 1.2 (ref 0.7–1.7)
Albumin ELP: 3.7 g/dL (ref 2.9–4.4)
Alpha-1-Globulin: 0.2 g/dL (ref 0.0–0.4)
Alpha-2-Globulin: 0.7 g/dL (ref 0.4–1.0)
Beta Globulin: 1 g/dL (ref 0.7–1.3)
Gamma Globulin: 1.2 g/dL (ref 0.4–1.8)
Globulin, Total: 3.1 g/dL (ref 2.2–3.9)
Total Protein ELP: 6.8 g/dL (ref 6.0–8.5)

## 2020-06-12 MED ORDER — LENALIDOMIDE 10 MG PO CAPS: ORAL_CAPSULE | ORAL | 0 refills | Status: DC

## 2020-06-12 NOTE — Addendum Note (Signed)
Addended by: Ardeen Garland on: 06/12/2020 08:49 AM   Modules accepted: Orders

## 2020-07-13 ENCOUNTER — Other Ambulatory Visit: Payer: Self-pay | Admitting: Internal Medicine

## 2020-07-13 DIAGNOSIS — C9001 Multiple myeloma in remission: Secondary | ICD-10-CM

## 2020-07-13 NOTE — Telephone Encounter (Signed)
Faxed Revlimid refill to [facility].

## 2020-07-23 ENCOUNTER — Other Ambulatory Visit: Payer: Self-pay

## 2020-07-23 ENCOUNTER — Inpatient Hospital Stay: Payer: 59

## 2020-07-23 ENCOUNTER — Inpatient Hospital Stay: Payer: 59 | Attending: Internal Medicine

## 2020-07-23 VITALS — BP 146/63 | HR 60 | Temp 98.6°F | Resp 18

## 2020-07-23 DIAGNOSIS — C9000 Multiple myeloma not having achieved remission: Secondary | ICD-10-CM | POA: Insufficient documentation

## 2020-07-23 DIAGNOSIS — C9001 Multiple myeloma in remission: Secondary | ICD-10-CM

## 2020-07-23 DIAGNOSIS — Z79899 Other long term (current) drug therapy: Secondary | ICD-10-CM | POA: Insufficient documentation

## 2020-07-23 LAB — CBC WITH DIFFERENTIAL (CANCER CENTER ONLY)
Abs Immature Granulocytes: 0 10*3/uL (ref 0.00–0.07)
Basophils Absolute: 0.1 10*3/uL (ref 0.0–0.1)
Basophils Relative: 2 %
Eosinophils Absolute: 0.5 10*3/uL (ref 0.0–0.5)
Eosinophils Relative: 13 %
HCT: 39.6 % (ref 39.0–52.0)
Hemoglobin: 13.7 g/dL (ref 13.0–17.0)
Immature Granulocytes: 0 %
Lymphocytes Relative: 49 %
Lymphs Abs: 1.8 10*3/uL (ref 0.7–4.0)
MCH: 34.6 pg — ABNORMAL HIGH (ref 26.0–34.0)
MCHC: 34.6 g/dL (ref 30.0–36.0)
MCV: 100 fL (ref 80.0–100.0)
Monocytes Absolute: 0.5 10*3/uL (ref 0.1–1.0)
Monocytes Relative: 13 %
Neutro Abs: 0.9 10*3/uL — ABNORMAL LOW (ref 1.7–7.7)
Neutrophils Relative %: 23 %
Platelet Count: 155 10*3/uL (ref 150–400)
RBC: 3.96 MIL/uL — ABNORMAL LOW (ref 4.22–5.81)
RDW: 14.4 % (ref 11.5–15.5)
WBC Count: 3.7 10*3/uL — ABNORMAL LOW (ref 4.0–10.5)
nRBC: 0 % (ref 0.0–0.2)

## 2020-07-23 LAB — CMP (CANCER CENTER ONLY)
ALT: 32 U/L (ref 0–44)
AST: 28 U/L (ref 15–41)
Albumin: 3.8 g/dL (ref 3.5–5.0)
Alkaline Phosphatase: 85 U/L (ref 38–126)
Anion gap: 6 (ref 5–15)
BUN: 12 mg/dL (ref 8–23)
CO2: 26 mmol/L (ref 22–32)
Calcium: 8.8 mg/dL — ABNORMAL LOW (ref 8.9–10.3)
Chloride: 107 mmol/L (ref 98–111)
Creatinine: 1.34 mg/dL — ABNORMAL HIGH (ref 0.61–1.24)
GFR, Estimated: 58 mL/min — ABNORMAL LOW (ref >60)
Glucose, Bld: 94 mg/dL (ref 70–99)
Potassium: 4.3 mmol/L (ref 3.5–5.1)
Sodium: 139 mmol/L (ref 135–145)
Total Bilirubin: 0.8 mg/dL (ref 0.3–1.2)
Total Protein: 7.1 g/dL (ref 6.5–8.1)

## 2020-07-23 LAB — LACTATE DEHYDROGENASE: LDH: 108 U/L (ref 98–192)

## 2020-07-23 MED ORDER — ZOLEDRONIC ACID 4 MG/100ML IV SOLN
4.0000 mg | Freq: Once | INTRAVENOUS | Status: AC
Start: 2020-07-23 — End: 2020-07-23
  Administered 2020-07-23: 4 mg via INTRAVENOUS

## 2020-07-23 MED ORDER — SODIUM CHLORIDE 0.9 % IV SOLN
Freq: Once | INTRAVENOUS | Status: AC
  Filled 2020-07-23: qty 250

## 2020-07-23 MED ORDER — ZOLEDRONIC ACID 4 MG/100ML IV SOLN
INTRAVENOUS | Status: AC
  Filled 2020-07-23: qty 100

## 2020-07-23 NOTE — Patient Instructions (Signed)

## 2020-07-25 LAB — PROTEIN ELECTROPHORESIS, SERUM, WITH REFLEX
A/G Ratio: 1.2 (ref 0.7–1.7)
Albumin ELP: 3.8 g/dL (ref 2.9–4.4)
Alpha-1-Globulin: 0.3 g/dL (ref 0.0–0.4)
Alpha-2-Globulin: 0.7 g/dL (ref 0.4–1.0)
Beta Globulin: 0.9 g/dL (ref 0.7–1.3)
Gamma Globulin: 1.3 g/dL (ref 0.4–1.8)
Globulin, Total: 3.2 g/dL (ref 2.2–3.9)
Total Protein ELP: 7 g/dL (ref 6.0–8.5)

## 2020-08-11 ENCOUNTER — Other Ambulatory Visit: Payer: Self-pay | Admitting: Internal Medicine

## 2020-08-11 DIAGNOSIS — C9001 Multiple myeloma in remission: Secondary | ICD-10-CM

## 2020-08-13 ENCOUNTER — Other Ambulatory Visit: Payer: Self-pay | Admitting: Medical Oncology

## 2020-08-13 ENCOUNTER — Encounter: Payer: Self-pay | Admitting: Internal Medicine

## 2020-08-13 DIAGNOSIS — C9001 Multiple myeloma in remission: Secondary | ICD-10-CM

## 2020-08-13 MED ORDER — LENALIDOMIDE 10 MG PO CAPS: ORAL_CAPSULE | ORAL | 0 refills | Status: DC

## 2020-09-07 ENCOUNTER — Other Ambulatory Visit: Payer: Self-pay | Admitting: Internal Medicine

## 2020-09-07 DIAGNOSIS — C9001 Multiple myeloma in remission: Secondary | ICD-10-CM

## 2020-09-07 MED ORDER — LENALIDOMIDE 10 MG PO CAPS: ORAL_CAPSULE | ORAL | 0 refills | Status: DC

## 2020-09-07 NOTE — Telephone Encounter (Signed)
Auth number called to [facility] for Revlimid.

## 2020-09-07 NOTE — Addendum Note (Signed)
Addended by: Ardeen Garland on: 09/07/2020 10:32 AM   Modules accepted: Orders

## 2020-10-05 ENCOUNTER — Other Ambulatory Visit: Payer: Self-pay | Admitting: Medical Oncology

## 2020-10-05 ENCOUNTER — Other Ambulatory Visit: Payer: Self-pay | Admitting: Internal Medicine

## 2020-10-05 DIAGNOSIS — C9001 Multiple myeloma in remission: Secondary | ICD-10-CM

## 2020-10-05 MED ORDER — LENALIDOMIDE 10 MG PO CAPS: ORAL_CAPSULE | ORAL | 0 refills | Status: DC

## 2020-10-15 ENCOUNTER — Telehealth: Payer: Self-pay | Admitting: Medical Oncology

## 2020-10-15 ENCOUNTER — Other Ambulatory Visit: Payer: Self-pay | Admitting: Medical Oncology

## 2020-10-15 NOTE — Telephone Encounter (Signed)
Auth number for revlimid refill called to [facility].   Pt insurance information give to RPh-they did not have his insurance information.

## 2020-10-23 ENCOUNTER — Inpatient Hospital Stay: Payer: Self-pay

## 2020-10-23 ENCOUNTER — Other Ambulatory Visit: Payer: Self-pay

## 2020-10-23 ENCOUNTER — Inpatient Hospital Stay: Payer: Self-pay | Attending: Internal Medicine | Admitting: Internal Medicine

## 2020-10-23 ENCOUNTER — Encounter: Payer: Self-pay | Admitting: Internal Medicine

## 2020-10-23 VITALS — BP 152/84 | HR 73 | Temp 97.8°F | Resp 17 | Wt 237.7 lb

## 2020-10-23 DIAGNOSIS — C9000 Multiple myeloma not having achieved remission: Secondary | ICD-10-CM | POA: Insufficient documentation

## 2020-10-23 DIAGNOSIS — C9001 Multiple myeloma in remission: Secondary | ICD-10-CM

## 2020-10-23 DIAGNOSIS — F419 Anxiety disorder, unspecified: Secondary | ICD-10-CM

## 2020-10-23 DIAGNOSIS — Z5111 Encounter for antineoplastic chemotherapy: Secondary | ICD-10-CM

## 2020-10-23 DIAGNOSIS — Z79899 Other long term (current) drug therapy: Secondary | ICD-10-CM | POA: Insufficient documentation

## 2020-10-23 DIAGNOSIS — Z9484 Stem cells transplant status: Secondary | ICD-10-CM | POA: Insufficient documentation

## 2020-10-23 DIAGNOSIS — G620 Drug-induced polyneuropathy: Secondary | ICD-10-CM | POA: Insufficient documentation

## 2020-10-23 DIAGNOSIS — T451X5A Adverse effect of antineoplastic and immunosuppressive drugs, initial encounter: Secondary | ICD-10-CM | POA: Insufficient documentation

## 2020-10-23 NOTE — Progress Notes (Signed)
[place] Telephone:(336) [phone]   Fax:(336) [phone]  OFFICE PROGRESS NOTE  Derinda Late, MD [phone] [address] [facility] [place] [address]  DIAGNOSIS: Multiple myeloma, IgG subtype diagnosed in January 2020.   PRIOR THERAPY:  1) Systemic chemotherapy with Velcade 1.3 mg/M2 on days 1, 8, 15 as well as Revlimid 25 mg p.o. daily for 21 days every 4 weeks as well as Decadron 40 mg weekly.  First dose expected March 22, 2018.  Status post 6 cycles. 2) autologous stem cell transplant at [facility] on 10/08/2018.   CURRENT THERAPY:   1) Maintenance Revlimid 10 mg p.o. daily.   2) Zometa infusion every 3 months.  INTERVAL HISTORY: Matthew Yates 70 y.o. male returns to the clinic today for follow-up visit.  The patient is physically doing fine with no concerning complaints but he is very anxious and stressed about his disability approval by the social service.  The patient mentioned that he is running out of his insurance and medication and he has not received the final decision from the [place] office yet.  He denied having any current chest pain, shortness of breath, cough or hemoptysis.  He denied having any fever or chills.  He has no nausea, vomiting, diarrhea or constipation.  He has no headache or visual changes.  He is here today for evaluation and repeat blood work.   MEDICAL HISTORY: Past Medical History:  Diagnosis Date   Acute medial meniscus tear of left knee    Arthritis    lt knee   GERD (gastroesophageal reflux disease)    Hypertension     ALLERGIES:  is allergic to other.  MEDICATIONS:  Current Outpatient Medications  Medication Sig Dispense Refill   ALPRAZolam (XANAX) 0.25 MG tablet Take 1 tablet (0.25 mg total) by mouth at bedtime as needed for anxiety. 30 tablet 0   amLODipine (NORVASC) 5 MG tablet Take 5 mg by mouth daily.     chlorhexidine (PERIDEX) 0.12 % solution SMARTSIG:By  Mouth     clonazePAM (KLONOPIN) 0.5 MG tablet Take 1 tablet by mouth 2 (two) times daily as needed.     COVID-19 mRNA vaccine, Moderna, 100 MCG/0.5ML injection Inject into the muscle. 0.25 mL 0   esomeprazole (NEXIUM) 20 MG capsule Take by mouth.     fluticasone (FLONASE) 50 MCG/ACT nasal spray Place 2 sprays into both nostrils daily.     HYDROcodone-acetaminophen (NORCO) 7.5-325 MG tablet Take 1-2 tablets by mouth every 6 (six) hours as needed for moderate pain. 90 tablet 0   ibuprofen (ADVIL,MOTRIN) 400 MG tablet Take 400 mg by mouth every 6 (six) hours as needed.     lenalidomide (REVLIMID) 10 MG capsule TAKE 1 CAPSULE DAILY, CONTINUOUSLY Adult Male Authorization #=[id number] Date 10/05/2020 28 capsule 0   loratadine-pseudoephedrine (CLARITIN-D 12-HOUR) 5-120 MG tablet Take 1 tablet by mouth 2 (two) times daily.     LORazepam (ATIVAN) 0.5 MG tablet Take by mouth.     ondansetron (ZOFRAN) 4 MG tablet Take 1-2 tablets (4-8 mg total) by mouth every 8 (eight) hours as needed for nausea or vomiting. 40 tablet 0   prochlorperazine (COMPAZINE) 10 MG tablet Take 1 tablet (10 mg total) by mouth every 6 (six) hours as needed for nausea or vomiting. 30 tablet 0   senna-docusate (SENOKOT S) 8.6-50 MG tablet Take 1 tablet by mouth at bedtime as needed. 30 tablet 1   traZODone (  DESYREL) 50 MG tablet Take 1 tablet by mouth at bedtime as needed.     valACYclovir (VALTREX) 500 MG tablet Take 500 mg by mouth daily.     vitamin B-12 (CYANOCOBALAMIN) 1000 MCG tablet Take by mouth.     warfarin (COUMADIN) 2 MG tablet TAKE 1 TABLET BY MOUTH EVERY DAY 90 tablet 1   No current facility-administered medications for this visit.    SURGICAL HISTORY:  Past Surgical History:  Procedure Laterality Date   CHONDROPLASTY Left 07/09/2016   Procedure: CHONDROPLASTY with MICROFRACTURE;  Surgeon: Leandrew Koyanagi, MD;  Location: [place];  Service: Orthopedics;  Laterality: Left;   COLONOSCOPY     x2   HIP  SURGERY     aspiration only, no anesthesia   KNEE ARTHROSCOPY WITH MEDIAL MENISECTOMY Left 07/09/2016   Procedure: LEFT KNEE ARTHROSCOPY WITH PARTIAL MEDIAL MENISCECTOMY;  Surgeon: Leandrew Koyanagi, MD;  Location: [place];  Service: Orthopedics;  Laterality: Left;    REVIEW OF SYSTEMS:  A comprehensive review of systems was negative except for: Behavioral/Psych: positive for anxiety   PHYSICAL EXAMINATION: General appearance: alert, cooperative, fatigued, and no distress Head: Normocephalic, without obvious abnormality, atraumatic Neck: no adenopathy, no JVD, supple, symmetrical, trachea midline, and thyroid not enlarged, symmetric, no tenderness/mass/nodules Lymph nodes: Cervical, supraclavicular, and axillary nodes normal. Resp: clear to auscultation bilaterally Back: symmetric, no curvature. ROM normal. No CVA tenderness. Cardio: regular rate and rhythm, S1, S2 normal, no murmur, click, rub or gallop GI: soft, non-tender; bowel sounds normal; no masses,  no organomegaly Extremities: extremities normal, atraumatic, no cyanosis or edema  ECOG PERFORMANCE STATUS: 1 - Symptomatic but completely ambulatory  Blood pressure (!) 152/84, pulse 73, temperature 97.8 F (36.6 C), temperature source Tympanic, resp. rate 17, weight 237 lb 11.2 oz (107.8 kg), SpO2 99 %.  LABORATORY DATA: Lab Results  Component Value Date   WBC 3.7 (L) 07/23/2020   HGB 13.7 07/23/2020   HCT 39.6 07/23/2020   MCV 100.0 07/23/2020   PLT 155 07/23/2020      Chemistry      Component Value Date/Time   NA 139 07/23/2020 0838   K 4.3 07/23/2020 0838   CL 107 07/23/2020 0838   CO2 26 07/23/2020 0838   BUN 12 07/23/2020 0838   CREATININE 1.34 (H) 07/23/2020 0838      Component Value Date/Time   CALCIUM 8.8 (L) 07/23/2020 0838   ALKPHOS 85 07/23/2020 0838   AST 28 07/23/2020 0838   ALT 32 07/23/2020 0838   BILITOT 0.8 07/23/2020 0838       RADIOGRAPHIC STUDIES: No results  found.  ASSESSMENT AND PLAN: This is a very pleasant 70 years old African-American male recently diagnosed with multiple myeloma, IgG subtype.  He is currently undergoing treatment with Revlimid, Velcade and Decadron status post 6 cycles. The patient tolerated his treatment well with no concerning complaints except for mild peripheral neuropathy. The patient has very good response to this treatment. On October 08, 2018 he underwent autologous stem cell transplant at [facility] and tolerated the procedure well  The patient is currently on maintenance treatment with Revlimid 10 mg p.o. daily status post 19  months of treatment. The patient has been tolerating this treatment well with no concerning adverse effect except for fatigue.  He is currently applying for Social Security to cover his health insurance and medication but has not received a final decision yet is very anxious and worried about his condition  and receiving his medication on a timely manner. I will ask the pharmacist for oral oncolytic to check with the patient and see what we can do from our side during this period to make sure he continue his medication at regular basis.Marland Kitchen He will also continue on Zometa for the bone disease. For the peripheral neuropathy, he will continue his current treatment with gabapentin. I will see him back for follow-up visit in 2 months for evaluation and repeat myeloma panel. The patient was advised to call immediately if he has any concerning symptoms in the interval. The patient voices understanding of current disease status and treatment options and is in agreement with the current care plan. All questions were answered. The patient knows to call the clinic with any problems, questions or concerns. We can certainly see the patient much sooner if necessary.    Disclaimer: This note was dictated with voice recognition software. Similar sounding words can inadvertently be transcribed and may not be  corrected upon review.

## 2020-10-29 ENCOUNTER — Other Ambulatory Visit: Payer: Self-pay

## 2020-10-29 ENCOUNTER — Inpatient Hospital Stay: Payer: Self-pay

## 2020-10-29 VITALS — BP 150/73 | HR 73 | Temp 98.4°F | Resp 18

## 2020-10-29 DIAGNOSIS — C9001 Multiple myeloma in remission: Secondary | ICD-10-CM

## 2020-10-29 MED ORDER — SODIUM CHLORIDE 0.9 % IV SOLN: Freq: Once | INTRAVENOUS | Status: AC

## 2020-10-29 MED ORDER — ZOLEDRONIC ACID 4 MG/100ML IV SOLN
4.0000 mg | Freq: Once | INTRAVENOUS | Status: AC
  Administered 2020-10-29: 4 mg via INTRAVENOUS
  Filled 2020-10-29: qty 100

## 2020-10-29 NOTE — Patient Instructions (Signed)

## 2020-10-30 ENCOUNTER — Telehealth: Payer: Self-pay

## 2020-10-30 ENCOUNTER — Other Ambulatory Visit: Payer: Self-pay | Admitting: Medical Oncology

## 2020-10-30 DIAGNOSIS — C9001 Multiple myeloma in remission: Secondary | ICD-10-CM

## 2020-10-30 MED ORDER — LENALIDOMIDE 10 MG PO CAPS: ORAL_CAPSULE | ORAL | 0 refills | Status: DC

## 2020-10-30 NOTE — Progress Notes (Signed)
Called in  [facility]id [facility] [employer].

## 2020-10-30 NOTE — Telephone Encounter (Signed)
Patient is approved for Revlimid at no cost from [facility] 10/29/20-02/02/21  [facility] uses [employer] by [place] Patient [place] Phone [phone] Fax [phone] 10/30/2020 9:10 AM

## 2020-10-30 NOTE — Telephone Encounter (Signed)
[name] Encounter  Met patient in lobby to complete application for [place] in an effort to reduce patient's out of pocket expense for Revlimid to $0.    Application completed and faxed to [phone].   [facility] Access Support phone number for follow up is [phone].  This encounter will be updated until final determination.  [place] Patient [place] Phone [phone] Fax [phone] 10/30/2020 9:05 AM

## 2020-11-20 ENCOUNTER — Encounter: Payer: Self-pay | Admitting: Medical Oncology

## 2020-11-20 ENCOUNTER — Other Ambulatory Visit: Payer: Self-pay | Admitting: Medical Oncology

## 2020-11-20 DIAGNOSIS — C9001 Multiple myeloma in remission: Secondary | ICD-10-CM

## 2020-11-20 MED ORDER — LENALIDOMIDE 10 MG PO CAPS
ORAL_CAPSULE | ORAL | 0 refills | Status: DC
Start: 2020-11-20 — End: 2020-12-24

## 2020-11-20 NOTE — Telephone Encounter (Signed)
Refill request for Revlimid. Done.

## 2020-12-10 ENCOUNTER — Telehealth: Payer: Self-pay

## 2020-12-10 NOTE — Telephone Encounter (Signed)
[name] Encounter  Met patient in lobby to complete re-enrollment application for [place] in an effort to reduce patient's out of pocket expense for Revlimid to $0.    Application completed and faxed to [phone]   [facility] PAF phone number for follow up is [phone].  This encounter will be updated until final determination.  [place] Patient [place] Phone [phone] Fax [phone] 12/10/2020 5:20 PM

## 2020-12-17 ENCOUNTER — Inpatient Hospital Stay: Payer: Self-pay | Attending: Internal Medicine

## 2020-12-17 ENCOUNTER — Other Ambulatory Visit: Payer: Self-pay

## 2020-12-17 DIAGNOSIS — Z9484 Stem cells transplant status: Secondary | ICD-10-CM | POA: Insufficient documentation

## 2020-12-17 DIAGNOSIS — G629 Polyneuropathy, unspecified: Secondary | ICD-10-CM | POA: Insufficient documentation

## 2020-12-17 DIAGNOSIS — C9001 Multiple myeloma in remission: Secondary | ICD-10-CM

## 2020-12-17 DIAGNOSIS — D701 Agranulocytosis secondary to cancer chemotherapy: Secondary | ICD-10-CM | POA: Insufficient documentation

## 2020-12-17 DIAGNOSIS — Z79899 Other long term (current) drug therapy: Secondary | ICD-10-CM | POA: Insufficient documentation

## 2020-12-17 DIAGNOSIS — T451X5A Adverse effect of antineoplastic and immunosuppressive drugs, initial encounter: Secondary | ICD-10-CM | POA: Insufficient documentation

## 2020-12-17 DIAGNOSIS — C9000 Multiple myeloma not having achieved remission: Secondary | ICD-10-CM | POA: Insufficient documentation

## 2020-12-17 LAB — CBC WITH DIFFERENTIAL (CANCER CENTER ONLY)
Abs Immature Granulocytes: 0 10*3/uL (ref 0.00–0.07)
Basophils Absolute: 0.1 10*3/uL (ref 0.0–0.1)
Basophils Relative: 2 %
Eosinophils Absolute: 0.4 10*3/uL (ref 0.0–0.5)
Eosinophils Relative: 11 %
HCT: 40.3 % (ref 39.0–52.0)
Hemoglobin: 13.7 g/dL (ref 13.0–17.0)
Immature Granulocytes: 0 %
Lymphocytes Relative: 53 %
Lymphs Abs: 1.9 10*3/uL (ref 0.7–4.0)
MCH: 35.2 pg — ABNORMAL HIGH (ref 26.0–34.0)
MCHC: 34 g/dL (ref 30.0–36.0)
MCV: 103.6 fL — ABNORMAL HIGH (ref 80.0–100.0)
Monocytes Absolute: 0.4 10*3/uL (ref 0.1–1.0)
Monocytes Relative: 12 %
Neutro Abs: 0.8 10*3/uL — ABNORMAL LOW (ref 1.7–7.7)
Neutrophils Relative %: 22 %
Platelet Count: 158 10*3/uL (ref 150–400)
RBC: 3.89 MIL/uL — ABNORMAL LOW (ref 4.22–5.81)
RDW: 13.6 % (ref 11.5–15.5)
WBC Count: 3.6 10*3/uL — ABNORMAL LOW (ref 4.0–10.5)
nRBC: 0 % (ref 0.0–0.2)

## 2020-12-17 LAB — LACTATE DEHYDROGENASE: LDH: 146 U/L (ref 98–192)

## 2020-12-17 LAB — CMP (CANCER CENTER ONLY)
ALT: 26 U/L (ref 0–44)
AST: 20 U/L (ref 15–41)
Albumin: 3.9 g/dL (ref 3.5–5.0)
Alkaline Phosphatase: 69 U/L (ref 38–126)
Anion gap: 9 (ref 5–15)
BUN: 13 mg/dL (ref 8–23)
CO2: 27 mmol/L (ref 22–32)
Calcium: 8.9 mg/dL (ref 8.9–10.3)
Chloride: 105 mmol/L (ref 98–111)
Creatinine: 1.13 mg/dL (ref 0.61–1.24)
GFR, Estimated: >60 mL/min (ref >60)
Glucose, Bld: 94 mg/dL (ref 70–99)
Potassium: 3.5 mmol/L (ref 3.5–5.1)
Sodium: 141 mmol/L (ref 135–145)
Total Bilirubin: 0.7 mg/dL (ref 0.3–1.2)
Total Protein: 7.3 g/dL (ref 6.5–8.1)

## 2020-12-18 LAB — IGG, IGA, IGM
IgA: 280 mg/dL (ref 61–437)
IgG (Immunoglobin G), Serum: 1375 mg/dL (ref 603–1613)
IgM (Immunoglobulin M), Srm: 30 mg/dL (ref 20–172)

## 2020-12-18 LAB — BETA 2 MICROGLOBULIN, SERUM: Beta-2 Microglobulin: 1.5 mg/L (ref 0.6–2.4)

## 2020-12-19 LAB — KAPPA/LAMBDA LIGHT CHAINS
Kappa free light chain: 56.8 mg/L — ABNORMAL HIGH (ref 3.3–19.4)
Kappa, lambda light chain ratio: 2.67 — ABNORMAL HIGH (ref 0.26–1.65)
Lambda free light chains: 21.3 mg/L (ref 5.7–26.3)

## 2020-12-24 ENCOUNTER — Other Ambulatory Visit: Payer: Self-pay | Admitting: Medical Oncology

## 2020-12-24 ENCOUNTER — Other Ambulatory Visit: Payer: Self-pay

## 2020-12-24 ENCOUNTER — Telehealth: Payer: Self-pay | Admitting: Medical Oncology

## 2020-12-24 ENCOUNTER — Inpatient Hospital Stay (HOSPITAL_BASED_OUTPATIENT_CLINIC_OR_DEPARTMENT_OTHER): Payer: Self-pay | Admitting: Internal Medicine

## 2020-12-24 ENCOUNTER — Encounter: Payer: Self-pay | Admitting: Internal Medicine

## 2020-12-24 VITALS — BP 152/70 | HR 72 | Temp 98.3°F | Resp 20 | Wt 239.8 lb

## 2020-12-24 DIAGNOSIS — Z5111 Encounter for antineoplastic chemotherapy: Secondary | ICD-10-CM

## 2020-12-24 DIAGNOSIS — C9000 Multiple myeloma not having achieved remission: Secondary | ICD-10-CM

## 2020-12-24 MED ORDER — LENALIDOMIDE 5 MG PO CAPS: 5.0000 mg | ORAL_CAPSULE | Freq: Every day | ORAL | 0 refills | Status: DC

## 2020-12-24 NOTE — Progress Notes (Signed)
[place] [phone]   [phone]  OFFICE PROGRESS NOTE  Derinda Late, MD [phone] [facility] [place] [address]  DIAGNOSIS: Multiple myeloma, IgG subtype diagnosed in January 2020.   PRIOR THERAPY:  1) Systemic chemotherapy with Velcade 1.3 mg/M2 on days 1, 8, 15 as well as Revlimid 25 mg p.o. daily for 21 days every 4 weeks as well as Decadron 40 mg weekly.  First dose expected March 22, 2018.  Status post 6 cycles. 2) autologous stem cell transplant at [facility] on 10/08/2018.   CURRENT THERAPY:   1) Maintenance Revlimid 10 mg p.o. daily.  This will change to Revlimid 5 mg p.o. daily because of the persistent drug-induced neutropenia. 2) Zometa infusion every 3 months.  INTERVAL HISTORY: Matthew Yates 70 y.o. male returns to the clinic today for follow-up visit.  The patient is feeling fine today with no concerning complaints.  He denied having any current chest pain, shortness of breath, cough or hemoptysis.  He denied having any fever or chills.  He has no nausea, vomiting, diarrhea or constipation.  He has no headache or visual changes.  He was seen recently by Dr. Leida Lauth at [facility] and he received Evusheld at [facility].  The patient continues to tolerate his treatment with Revlimid fairly well except for the drug-induced neutropenia.  Dr. Leida Lauth recommended for the patient reducing his dose of Revlimid to 5 mg daily.  The patient had repeat myeloma panel performed recently and he is here for evaluation and discussion of his lab results and recommendation regarding his condition.   MEDICAL HISTORY: Past Medical History:  Diagnosis Date   Acute medial meniscus tear of left knee    Arthritis    lt knee   GERD (gastroesophageal reflux disease)    Hypertension     ALLERGIES:  is allergic to other.  MEDICATIONS:  Current Outpatient Medications  Medication  Sig Dispense Refill   ALPRAZolam (XANAX) 0.25 MG tablet Take 1 tablet (0.25 mg total) by mouth at bedtime as needed for anxiety. 30 tablet 0   amLODipine (NORVASC) 5 MG tablet Take 5 mg by mouth daily.     fluticasone (FLONASE) 50 MCG/ACT nasal spray Place 2 sprays into both nostrils daily.     HYDROcodone-acetaminophen (NORCO) 7.5-325 MG tablet Take 1-2 tablets by mouth every 6 (six) hours as needed for moderate pain. 90 tablet 0   ibuprofen (ADVIL,MOTRIN) 400 MG tablet Take 400 mg by mouth every 6 (six) hours as needed.     loratadine-pseudoephedrine (CLARITIN-D 12-HOUR) 5-120 MG tablet Take 1 tablet by mouth 2 (two) times daily.     LORazepam (ATIVAN) 0.5 MG tablet Take by mouth.     valACYclovir (VALTREX) 500 MG tablet Take 500 mg by mouth daily.     vitamin B-12 (CYANOCOBALAMIN) 1000 MCG tablet Take by mouth.     No current facility-administered medications for this visit.    SURGICAL HISTORY:  Past Surgical History:  Procedure Laterality Date   CHONDROPLASTY Left 07/09/2016   Procedure: CHONDROPLASTY with MICROFRACTURE;  Surgeon: Leandrew Koyanagi, MD;  Location: [place];  Service: Orthopedics;  Laterality: Left;   COLONOSCOPY     x2   HIP SURGERY     aspiration only, no anesthesia   KNEE ARTHROSCOPY WITH MEDIAL MENISECTOMY Left 07/09/2016   Procedure: LEFT KNEE ARTHROSCOPY WITH PARTIAL MEDIAL MENISCECTOMY;  Surgeon:  Leandrew Koyanagi, MD;  Location: [place];  Service: Orthopedics;  Laterality: Left;    REVIEW OF SYSTEMS:  Constitutional: positive for fatigue Eyes: negative Ears, nose, mouth, throat, and face: negative Respiratory: negative Cardiovascular: negative Gastrointestinal: negative Genitourinary:negative Integument/breast: negative Hematologic/lymphatic: negative Musculoskeletal:negative Neurological: negative Behavioral/Psych: positive for anxiety Endocrine: negative Allergic/Immunologic: negative   PHYSICAL EXAMINATION: General  appearance: alert, cooperative, appears stated age, and no distress Head: Normocephalic, without obvious abnormality, atraumatic Neck: no adenopathy, no JVD, supple, symmetrical, trachea midline, and thyroid not enlarged, symmetric, no tenderness/mass/nodules Lymph nodes: Cervical, supraclavicular, and axillary nodes normal. Resp: clear to auscultation bilaterally Back: symmetric, no curvature. ROM normal. No CVA tenderness. Cardio: regular rate and rhythm, S1, S2 normal, no murmur, click, rub or gallop GI: soft, non-tender; bowel sounds normal; no masses,  no organomegaly Extremities: extremities normal, atraumatic, no cyanosis or edema Neurologic: Alert and oriented X 3, normal strength and tone. Normal symmetric reflexes. Normal coordination and gait  ECOG PERFORMANCE STATUS: 1 - Symptomatic but completely ambulatory  Blood pressure (!) 152/70, pulse 72, temperature 98.3 F (36.8 C), resp. rate 20, weight 239 lb 12.8 oz (108.8 kg), SpO2 100 %.  LABORATORY DATA: Lab Results  Component Value Date   WBC 3.6 (L) 12/17/2020   HGB 13.7 12/17/2020   HCT 40.3 12/17/2020   MCV 103.6 (H) 12/17/2020   PLT 158 12/17/2020      Chemistry      Component Value Date/Time   NA 141 12/17/2020 1034   K 3.5 12/17/2020 1034   CL 105 12/17/2020 1034   CO2 27 12/17/2020 1034   BUN 13 12/17/2020 1034   CREATININE 1.13 12/17/2020 1034      Component Value Date/Time   CALCIUM 8.9 12/17/2020 1034   ALKPHOS 69 12/17/2020 1034   AST 20 12/17/2020 1034   ALT 26 12/17/2020 1034   BILITOT 0.7 12/17/2020 1034       RADIOGRAPHIC STUDIES: No results found.  ASSESSMENT AND PLAN: This is a very pleasant 70 years old African-American male recently diagnosed with multiple myeloma, IgG subtype.  He is currently undergoing treatment with Revlimid, Velcade and Decadron status post 6 cycles. The patient tolerated his treatment well with no concerning complaints except for mild peripheral neuropathy. The  patient has very good response to this treatment. On October 08, 2018 he underwent autologous stem cell transplant at [facility] and tolerated the procedure well  The patient is currently on maintenance treatment with Revlimid 10 mg p.o. daily status post 21  months of treatment. The patient has been tolerating this treatment well except for the drug-induced neutropenia. He had repeat myeloma panel performed recently.  I discussed the lab results with the patient and that showed no concerning findings for progression and there was further improvement. I recommended for him to reduce the dose of Revlimid to 5 mg p.o. daily as recommended by Dr. Leida Lauth because of the neutropenia. For the bone disease, he will continue his treatment with Zometa every 3 months. He will also continue on Zometa for the bone disease. For the peripheral neuropathy, he will continue his current treatment with gabapentin. The patient will come back for follow-up visit in 3 months for evaluation with repeat blood work. He was advised to call immediately if he has any other concerning symptoms in the interval. The patient voices understanding of current disease status and treatment options and is in agreement with the current care plan. All questions were answered. The patient knows  to call the clinic with any problems, questions or concerns. We can certainly see the patient much sooner if necessary.    Disclaimer: This note was dictated with voice recognition software. Similar sounding words can inadvertently be transcribed and may not be corrected upon review.

## 2020-12-24 NOTE — Telephone Encounter (Signed)
Called in refill for revlimid. [facility] received faxed rx.

## 2021-01-02 ENCOUNTER — Telehealth: Payer: Self-pay | Admitting: Internal Medicine

## 2021-01-02 NOTE — Telephone Encounter (Signed)
Sch per 11/21 los, pt aware 

## 2021-01-18 ENCOUNTER — Other Ambulatory Visit: Payer: Self-pay

## 2021-01-18 DIAGNOSIS — C9000 Multiple myeloma not having achieved remission: Secondary | ICD-10-CM

## 2021-01-18 MED ORDER — LENALIDOMIDE 5 MG PO CAPS: 5.0000 mg | ORAL_CAPSULE | Freq: Every day | ORAL | 0 refills | Status: DC

## 2021-01-25 ENCOUNTER — Encounter: Payer: Self-pay | Admitting: Internal Medicine

## 2021-01-25 ENCOUNTER — Other Ambulatory Visit: Payer: Self-pay | Admitting: Medical Oncology

## 2021-01-25 ENCOUNTER — Inpatient Hospital Stay: Payer: Self-pay | Attending: Internal Medicine

## 2021-01-25 ENCOUNTER — Other Ambulatory Visit: Payer: Self-pay

## 2021-01-25 ENCOUNTER — Inpatient Hospital Stay: Payer: Self-pay

## 2021-01-25 VITALS — BP 150/67 | HR 70 | Temp 98.4°F | Resp 16

## 2021-01-25 DIAGNOSIS — C9001 Multiple myeloma in remission: Secondary | ICD-10-CM

## 2021-01-25 DIAGNOSIS — C9000 Multiple myeloma not having achieved remission: Secondary | ICD-10-CM | POA: Insufficient documentation

## 2021-01-25 LAB — CMP (CANCER CENTER ONLY)
ALT: 20 U/L (ref 0–44)
AST: 17 U/L (ref 15–41)
Albumin: 4 g/dL (ref 3.5–5.0)
Alkaline Phosphatase: 58 U/L (ref 38–126)
Anion gap: 7 (ref 5–15)
BUN: 8 mg/dL (ref 8–23)
CO2: 28 mmol/L (ref 22–32)
Calcium: 9 mg/dL (ref 8.9–10.3)
Chloride: 106 mmol/L (ref 98–111)
Creatinine: 1.21 mg/dL (ref 0.61–1.24)
GFR, Estimated: >60 mL/min (ref >60)
Glucose, Bld: 111 mg/dL — ABNORMAL HIGH (ref 70–99)
Potassium: 3.4 mmol/L — ABNORMAL LOW (ref 3.5–5.1)
Sodium: 141 mmol/L (ref 135–145)
Total Bilirubin: 0.9 mg/dL (ref 0.3–1.2)
Total Protein: 7.2 g/dL (ref 6.5–8.1)

## 2021-01-25 LAB — CBC WITH DIFFERENTIAL (CANCER CENTER ONLY)
Abs Immature Granulocytes: 0.01 10*3/uL (ref 0.00–0.07)
Basophils Absolute: 0.1 10*3/uL (ref 0.0–0.1)
Basophils Relative: 2 %
Eosinophils Absolute: 0.4 10*3/uL (ref 0.0–0.5)
Eosinophils Relative: 10 %
HCT: 40.5 % (ref 39.0–52.0)
Hemoglobin: 14.2 g/dL (ref 13.0–17.0)
Immature Granulocytes: 0 %
Lymphocytes Relative: 48 %
Lymphs Abs: 1.7 10*3/uL (ref 0.7–4.0)
MCH: 35.1 pg — ABNORMAL HIGH (ref 26.0–34.0)
MCHC: 35.1 g/dL (ref 30.0–36.0)
MCV: 100 fL (ref 80.0–100.0)
Monocytes Absolute: 0.4 10*3/uL (ref 0.1–1.0)
Monocytes Relative: 12 %
Neutro Abs: 1 10*3/uL — ABNORMAL LOW (ref 1.7–7.7)
Neutrophils Relative %: 28 %
Platelet Count: 156 10*3/uL (ref 150–400)
RBC: 4.05 MIL/uL — ABNORMAL LOW (ref 4.22–5.81)
RDW: 12.8 % (ref 11.5–15.5)
WBC Count: 3.6 10*3/uL — ABNORMAL LOW (ref 4.0–10.5)
nRBC: 0 % (ref 0.0–0.2)

## 2021-01-25 LAB — LACTATE DEHYDROGENASE: LDH: 124 U/L (ref 98–192)

## 2021-01-25 MED ORDER — ZOLEDRONIC ACID 4 MG/100ML IV SOLN
4.0000 mg | Freq: Once | INTRAVENOUS | Status: AC
  Administered 2021-01-25: 10:00:00 4 mg via INTRAVENOUS
  Filled 2021-01-25: qty 100

## 2021-01-25 MED ORDER — SODIUM CHLORIDE 0.9 % IV SOLN: Freq: Once | INTRAVENOUS | Status: AC

## 2021-01-25 NOTE — Patient Instructions (Signed)

## 2021-02-27 NOTE — Telephone Encounter (Signed)
Patient is approved for Revlimid at no cost from [facility] 02/19/21-02/02/22  [facility] uses [facility] [place] Phone [phone] Fax [phone] 02/27/2021 12:04 PM

## 2021-02-28 ENCOUNTER — Other Ambulatory Visit: Payer: Self-pay | Admitting: Medical Oncology

## 2021-02-28 ENCOUNTER — Telehealth: Payer: Self-pay | Admitting: Medical Oncology

## 2021-02-28 DIAGNOSIS — C9000 Multiple myeloma not having achieved remission: Secondary | ICD-10-CM

## 2021-02-28 MED ORDER — LENALIDOMIDE 5 MG PO CAPS: 5.0000 mg | ORAL_CAPSULE | Freq: Every day | ORAL | 0 refills | Status: DC

## 2021-02-28 NOTE — Telephone Encounter (Signed)
Faxed revlimid refill to preferred pharmacy.

## 2021-02-28 NOTE — Addendum Note (Signed)
Addended by: Ardeen Garland on: 02/28/2021 03:11 PM   Modules accepted: Orders

## 2021-02-28 NOTE — Telephone Encounter (Signed)
LM that refill is ready.  I need to verify pharmacy  He was approved for drug assistance.

## 2021-03-25 ENCOUNTER — Encounter: Payer: Self-pay | Admitting: Internal Medicine

## 2021-03-25 ENCOUNTER — Other Ambulatory Visit: Payer: Self-pay

## 2021-03-25 ENCOUNTER — Ambulatory Visit: Payer: Self-pay

## 2021-03-25 ENCOUNTER — Inpatient Hospital Stay: Payer: Medicare Other | Attending: Internal Medicine

## 2021-03-25 ENCOUNTER — Inpatient Hospital Stay (HOSPITAL_BASED_OUTPATIENT_CLINIC_OR_DEPARTMENT_OTHER): Payer: Medicare Other | Admitting: Internal Medicine

## 2021-03-25 VITALS — BP 151/79 | HR 82 | Temp 98.1°F | Resp 19 | Ht 75.0 in | Wt 240.7 lb

## 2021-03-25 DIAGNOSIS — C9000 Multiple myeloma not having achieved remission: Secondary | ICD-10-CM | POA: Insufficient documentation

## 2021-03-25 DIAGNOSIS — R197 Diarrhea, unspecified: Secondary | ICD-10-CM | POA: Insufficient documentation

## 2021-03-25 DIAGNOSIS — Z79899 Other long term (current) drug therapy: Secondary | ICD-10-CM | POA: Diagnosis not present

## 2021-03-25 DIAGNOSIS — C9001 Multiple myeloma in remission: Secondary | ICD-10-CM

## 2021-03-25 DIAGNOSIS — Z5111 Encounter for antineoplastic chemotherapy: Secondary | ICD-10-CM

## 2021-03-25 LAB — CBC WITH DIFFERENTIAL (CANCER CENTER ONLY)
Abs Immature Granulocytes: 0.01 10*3/uL (ref 0.00–0.07)
Basophils Absolute: 0 10*3/uL (ref 0.0–0.1)
Basophils Relative: 0 %
Eosinophils Absolute: 0.4 10*3/uL (ref 0.0–0.5)
Eosinophils Relative: 8 %
HCT: 39.8 % (ref 39.0–52.0)
Hemoglobin: 14 g/dL (ref 13.0–17.0)
Immature Granulocytes: 0 %
Lymphocytes Relative: 45 %
Lymphs Abs: 2 10*3/uL (ref 0.7–4.0)
MCH: 34.1 pg — ABNORMAL HIGH (ref 26.0–34.0)
MCHC: 35.2 g/dL (ref 30.0–36.0)
MCV: 97.1 fL (ref 80.0–100.0)
Monocytes Absolute: 0.4 10*3/uL (ref 0.1–1.0)
Monocytes Relative: 8 %
Neutro Abs: 1.8 10*3/uL (ref 1.7–7.7)
Neutrophils Relative %: 39 %
Platelet Count: 140 10*3/uL — ABNORMAL LOW (ref 150–400)
RBC: 4.1 MIL/uL — ABNORMAL LOW (ref 4.22–5.81)
RDW: 13.1 % (ref 11.5–15.5)
WBC Count: 4.6 10*3/uL (ref 4.0–10.5)
nRBC: 0 % (ref 0.0–0.2)

## 2021-03-25 LAB — CMP (CANCER CENTER ONLY)
ALT: 16 U/L (ref 0–44)
AST: 16 U/L (ref 15–41)
Albumin: 4 g/dL (ref 3.5–5.0)
Alkaline Phosphatase: 55 U/L (ref 38–126)
Anion gap: 7 (ref 5–15)
BUN: 11 mg/dL (ref 8–23)
CO2: 29 mmol/L (ref 22–32)
Calcium: 9.3 mg/dL (ref 8.9–10.3)
Chloride: 104 mmol/L (ref 98–111)
Creatinine: 1.19 mg/dL (ref 0.61–1.24)
GFR, Estimated: >60 mL/min (ref >60)
Glucose, Bld: 121 mg/dL — ABNORMAL HIGH (ref 70–99)
Potassium: 3.5 mmol/L (ref 3.5–5.1)
Sodium: 140 mmol/L (ref 135–145)
Total Bilirubin: 0.4 mg/dL (ref 0.3–1.2)
Total Protein: 7.2 g/dL (ref 6.5–8.1)

## 2021-03-25 NOTE — Progress Notes (Signed)
[place] Telephone:([phone]   Fax:([phone]  OFFICE PROGRESS NOTE  Derinda Late, MD [phone] [address] [facility] [place] [address]  DIAGNOSIS: Multiple myeloma, IgG subtype diagnosed in January 2020.   PRIOR THERAPY:  1) Systemic chemotherapy with Velcade 1.3 mg/M2 on days 1, 8, 15 as well as Revlimid 25 mg p.o. daily for 21 days every 4 weeks as well as Decadron 40 mg weekly.  First dose expected March 22, 2018.  Status post 6 cycles. 2) autologous stem cell transplant at [facility] on 10/08/2018.   CURRENT THERAPY:   1) Maintenance Revlimid 10 mg p.o. daily.  This changed to Revlimid 5 mg p.o. daily because of the persistent drug-induced neutropenia. 2) Zometa infusion every 3 months.  INTERVAL HISTORY: Matthew Yates 71 y.o. male returns to the clinic today for follow-up visit.  The patient is feeling fine today with no concerning complaints.  He was a little bit sad after he lost his cousin in a car accident a week ago.  He denied having any current chest pain, shortness of breath, cough or hemoptysis.  He denied having any fever or chills.  He has no nausea, vomiting, diarrhea or constipation.  He has no headache or visual changes.  He continues to tolerate his treatment with Revlimid fairly well except for few episodes of diarrhea but he does not take Imodium as prescribed.  He is here today for evaluation and repeat blood work.   MEDICAL HISTORY: Past Medical History:  Diagnosis Date   Acute medial meniscus tear of left knee    Arthritis    lt knee   GERD (gastroesophageal reflux disease)    Hypertension     ALLERGIES:  is allergic to other.  MEDICATIONS:  Current Outpatient Medications  Medication Sig Dispense Refill   ALPRAZolam (XANAX) 0.25 MG tablet Take 1 tablet (0.25 mg total) by mouth at bedtime as needed for anxiety. 30 tablet 0   amLODipine (NORVASC) 5 MG tablet Take 5 mg by  mouth daily.     fluticasone (FLONASE) 50 MCG/ACT nasal spray Place 2 sprays into both nostrils daily.     HYDROcodone-acetaminophen (NORCO) 7.5-325 MG tablet Take 1-2 tablets by mouth every 6 (six) hours as needed for moderate pain. 90 tablet 0   ibuprofen (ADVIL,MOTRIN) 400 MG tablet Take 400 mg by mouth every 6 (six) hours as needed.     lenalidomide (REVLIMID) 5 MG capsule Take 1 capsule (5 mg total) by mouth daily for 28 days. [name] #[id number] Date Obtained 02/28/2021 Adult male 74 capsule 0   loratadine-pseudoephedrine (CLARITIN-D 12-HOUR) 5-120 MG tablet Take 1 tablet by mouth 2 (two) times daily.     LORazepam (ATIVAN) 0.5 MG tablet Take by mouth.     valACYclovir (VALTREX) 500 MG tablet Take 500 mg by mouth daily.     vitamin B-12 (CYANOCOBALAMIN) 1000 MCG tablet Take by mouth.     No current facility-administered medications for this visit.    SURGICAL HISTORY:  Past Surgical History:  Procedure Laterality Date   CHONDROPLASTY Left 07/09/2016   Procedure: CHONDROPLASTY with MICROFRACTURE;  Surgeon: Leandrew Koyanagi, MD;  Location: [place];  Service: Orthopedics;  Laterality: Left;   COLONOSCOPY     x2   HIP SURGERY     aspiration only, no anesthesia   KNEE ARTHROSCOPY WITH MEDIAL MENISECTOMY Left 07/09/2016   Procedure: LEFT KNEE ARTHROSCOPY  WITH PARTIAL MEDIAL MENISCECTOMY;  Surgeon: Leandrew Koyanagi, MD;  Location: [place];  Service: Orthopedics;  Laterality: Left;    REVIEW OF SYSTEMS:  A comprehensive review of systems was negative except for: Constitutional: positive for fatigue Gastrointestinal: positive for diarrhea   PHYSICAL EXAMINATION: General appearance: alert, cooperative, appears stated age, and no distress Head: Normocephalic, without obvious abnormality, atraumatic Neck: no adenopathy, no JVD, supple, symmetrical, trachea midline, and thyroid not enlarged, symmetric, no tenderness/mass/nodules Lymph nodes: Cervical,  supraclavicular, and axillary nodes normal. Resp: clear to auscultation bilaterally Back: symmetric, no curvature. ROM normal. No CVA tenderness. Cardio: regular rate and rhythm, S1, S2 normal, no murmur, click, rub or gallop GI: soft, non-tender; bowel sounds normal; no masses,  no organomegaly Extremities: extremities normal, atraumatic, no cyanosis or edema  ECOG PERFORMANCE STATUS: 1 - Symptomatic but completely ambulatory  Blood pressure (!) 151/79, pulse 82, temperature 98.1 F (36.7 C), temperature source Tympanic, resp. rate 19, height 6' 3" (1.905 m), weight 240 lb 11.2 oz (109.2 kg), SpO2 100 %.  LABORATORY DATA: Lab Results  Component Value Date   WBC 4.6 03/25/2021   HGB 14.0 03/25/2021   HCT 39.8 03/25/2021   MCV 97.1 03/25/2021   PLT 140 (L) 03/25/2021      Chemistry      Component Value Date/Time   NA 141 01/25/2021 0845   K 3.4 (L) 01/25/2021 0845   CL 106 01/25/2021 0845   CO2 28 01/25/2021 0845   BUN 8 01/25/2021 0845   CREATININE 1.21 01/25/2021 0845      Component Value Date/Time   CALCIUM 9.0 01/25/2021 0845   ALKPHOS 58 01/25/2021 0845   AST 17 01/25/2021 0845   ALT 20 01/25/2021 0845   BILITOT 0.9 01/25/2021 0845       RADIOGRAPHIC STUDIES: No results found.  ASSESSMENT AND PLAN: This is a very pleasant 71 years old African-American male recently diagnosed with multiple myeloma, IgG subtype.  He is currently undergoing treatment with Revlimid, Velcade and Decadron status post 6 cycles. The patient tolerated his treatment well with no concerning complaints except for mild peripheral neuropathy. The patient has very good response to this treatment. On October 08, 2018 he underwent autologous stem cell transplant at [facility] and tolerated the procedure well  The patient is currently on maintenance treatment with Revlimid 10 mg p.o. daily status post 21  months of treatment.  This was changed to Revlimid 5 mg p.o. daily 3 months ago.  He is  tolerating it fairly well but continues to have few episodes of diarrhea. I recommended for the patient to continue with the same dose of Revlimid for now. For the diarrhea he was advised to take Imodium after every episode of diarrhea with a maximum of 8 tablets a day. For the bone disease, he will continue his treatment with Zometa every 3 months. For the peripheral neuropathy, he is currently on gabapentin. I will see the patient back for follow-up visit in 3 months for evaluation with repeat myeloma panel. He was advised to call immediately if he has any other concerning symptoms in the interval. The patient voices understanding of current disease status and treatment options and is in agreement with the current care plan. All questions were answered. The patient knows to call the clinic with any problems, questions or concerns. We can certainly see the patient much sooner if necessary.    Disclaimer: This note was dictated with voice recognition software. Similar sounding  words can inadvertently be transcribed and may not be corrected upon review.

## 2021-04-08 ENCOUNTER — Other Ambulatory Visit: Payer: Self-pay | Admitting: Medical Oncology

## 2021-04-08 DIAGNOSIS — C9000 Multiple myeloma not having achieved remission: Secondary | ICD-10-CM

## 2021-04-08 MED ORDER — LENALIDOMIDE 5 MG PO CAPS: 5.0000 mg | ORAL_CAPSULE | Freq: Every day | ORAL | 0 refills | Status: DC

## 2021-04-08 NOTE — Progress Notes (Signed)
Revlimid refill faxed to [facility] per policy. ?

## 2021-04-18 ENCOUNTER — Encounter: Payer: Self-pay | Admitting: Internal Medicine

## 2021-04-24 ENCOUNTER — Other Ambulatory Visit: Payer: Self-pay | Admitting: *Deleted

## 2021-04-24 ENCOUNTER — Telehealth: Payer: Self-pay | Admitting: Internal Medicine

## 2021-04-24 DIAGNOSIS — C9000 Multiple myeloma not having achieved remission: Secondary | ICD-10-CM

## 2021-04-24 NOTE — Telephone Encounter (Signed)
.  Called patient to schedule appointment per 3/20 inbasket, patient is aware of date and time.   ?

## 2021-04-25 ENCOUNTER — Inpatient Hospital Stay: Payer: Medicare Other

## 2021-04-25 ENCOUNTER — Other Ambulatory Visit: Payer: Self-pay

## 2021-04-25 ENCOUNTER — Inpatient Hospital Stay: Payer: Medicare Other | Attending: Internal Medicine

## 2021-04-25 VITALS — BP 142/76 | HR 73 | Temp 98.6°F | Resp 16 | Wt 240.0 lb

## 2021-04-25 DIAGNOSIS — C9000 Multiple myeloma not having achieved remission: Secondary | ICD-10-CM | POA: Diagnosis not present

## 2021-04-25 DIAGNOSIS — C9001 Multiple myeloma in remission: Secondary | ICD-10-CM

## 2021-04-25 LAB — BASIC METABOLIC PANEL - CANCER CENTER ONLY
Anion gap: 6 (ref 5–15)
BUN: 9 mg/dL (ref 8–23)
CO2: 30 mmol/L (ref 22–32)
Calcium: 9.4 mg/dL (ref 8.9–10.3)
Chloride: 104 mmol/L (ref 98–111)
Creatinine: 1.34 mg/dL — ABNORMAL HIGH (ref 0.61–1.24)
GFR, Estimated: 58 mL/min — ABNORMAL LOW (ref >60)
Glucose, Bld: 93 mg/dL (ref 70–99)
Potassium: 3.5 mmol/L (ref 3.5–5.1)
Sodium: 140 mmol/L (ref 135–145)

## 2021-04-25 MED ORDER — HEPARIN SOD (PORK) LOCK FLUSH 100 UNIT/ML IV SOLN: 500.0000 [IU] | Freq: Once | INTRAVENOUS | Status: DC | PRN

## 2021-04-25 MED ORDER — ZOLEDRONIC ACID 4 MG/100ML IV SOLN
4.0000 mg | Freq: Once | INTRAVENOUS | Status: AC
  Administered 2021-04-25: 4 mg via INTRAVENOUS
  Filled 2021-04-25: qty 100

## 2021-04-25 MED ORDER — ALTEPLASE 2 MG IJ SOLR: 2.0000 mg | Freq: Once | INTRAMUSCULAR | Status: DC | PRN

## 2021-04-25 MED ORDER — SODIUM CHLORIDE 0.9 % IV SOLN: INTRAVENOUS | Status: DC

## 2021-04-25 MED ORDER — HEPARIN SOD (PORK) LOCK FLUSH 100 UNIT/ML IV SOLN: 250.0000 [IU] | Freq: Once | INTRAVENOUS | Status: DC | PRN

## 2021-04-25 MED ORDER — SODIUM CHLORIDE 0.9% FLUSH: 3.0000 mL | Freq: Once | INTRAVENOUS | Status: DC | PRN

## 2021-04-25 MED ORDER — SODIUM CHLORIDE 0.9% FLUSH: 10.0000 mL | Freq: Once | INTRAVENOUS | Status: DC | PRN

## 2021-04-25 NOTE — Progress Notes (Signed)
Patient is taking Revlimid as prescribed.  Patient has not missed any doses and reports no side effects at this time.   ? ?Zometa infusion given per orders. Patient tolerated it well without problems. Vitals stable and discharged home from clinic ambulatory. Follow up as scheduled. ? ?

## 2021-04-25 NOTE — Patient Instructions (Signed)
[facility] MEDICAL ONCOLOGY  Discharge Instructions: °Thank you for choosing [facility] to provide your oncology and hematology care.  ° °If you have a lab appointment with the Cancer Center, please go directly to the Cancer Center and check in at the registration area. °  ° ° °We strive to give you quality time with your provider. You may need to reschedule your appointment if you arrive late (15 or more minutes).  Arriving late affects you and other patients whose appointments are after yours.  Also, if you miss three or more appointments without notifying the office, you may be dismissed from the clinic at the provider’s discretion.    °  °For prescription refill requests, have your pharmacy contact our office and allow 72 hours for refills to be completed.   ° ° °  °To help prevent nausea and vomiting after your treatment, we encourage you to take your nausea medication as directed. ° °BELOW ARE SYMPTOMS THAT SHOULD BE REPORTED IMMEDIATELY: °*FEVER GREATER THAN 100.4 F (38 °C) OR HIGHER °*CHILLS OR SWEATING °*NAUSEA AND VOMITING THAT IS NOT CONTROLLED WITH YOUR NAUSEA MEDICATION °*UNUSUAL SHORTNESS OF BREATH °*UNUSUAL BRUISING OR BLEEDING °*URINARY PROBLEMS (pain or burning when urinating, or frequent urination) °*BOWEL PROBLEMS (unusual diarrhea, constipation, pain near the anus) °TENDERNESS IN MOUTH AND THROAT WITH OR WITHOUT PRESENCE OF ULCERS (sore throat, sores in mouth, or a toothache) °UNUSUAL RASH, SWELLING OR PAIN  °UNUSUAL VAGINAL DISCHARGE OR ITCHING  ° °Items with * indicate a potential emergency and should be followed up as soon as possible or go to the Emergency Department if any problems should occur. ° °Please show the CHEMOTHERAPY ALERT CARD or IMMUNOTHERAPY ALERT CARD at check-in to the Emergency Department and triage nurse. ° °Should you have questions after your visit or need to cancel or reschedule your appointment, please contact [facility]  MEDICAL ONCOLOGY  Dept: [phone]  and follow the prompts.  Office hours are 8:00 a.m. to 4:30 p.m. Monday - Friday. Please note that voicemails left after 4:00 p.m. may not be returned until the following business day.  We are closed weekends and major holidays. You have access to a nurse at all times for urgent questions. Please call the main number to the clinic Dept: [phone] and follow the prompts. ° ° °For any non-urgent questions, you may also contact your provider using MyChart. We now offer e-Visits for anyone 18 and older to request care online for non-urgent symptoms. For details visit [email or web address]. °  °Also download the MyChart app! Go to the app store, search "MyChart", open the app, select [facility], and log in with your MyChart username and password. ° °Due to Covid, a mask is required upon entering the hospital/clinic. If you do not have a mask, one will be given to you upon arrival. For doctor visits, patients may have 1 support person aged 18 or older with them. For treatment visits, patients cannot have anyone with them due to current Covid guidelines and our immunocompromised population.  ° °

## 2021-05-06 ENCOUNTER — Telehealth: Payer: Self-pay

## 2021-05-06 DIAGNOSIS — C9000 Multiple myeloma not having achieved remission: Secondary | ICD-10-CM

## 2021-05-06 MED ORDER — LENALIDOMIDE 5 MG PO CAPS: 5.0000 mg | ORAL_CAPSULE | Freq: Every day | ORAL | 0 refills | Status: DC

## 2021-05-06 NOTE — Telephone Encounter (Signed)
Pt needs refill of Revlimid. [employer] Josem Kaufmann is [id number] and obtained today 05/06/21. ?

## 2021-05-06 NOTE — Telephone Encounter (Signed)
Rx signed by Dr. Julien Nordmann and faxed to [facility]. ?

## 2021-06-04 ENCOUNTER — Telehealth: Payer: Self-pay | Admitting: Medical Oncology

## 2021-06-04 ENCOUNTER — Other Ambulatory Visit: Payer: Self-pay | Admitting: Medical Oncology

## 2021-06-04 DIAGNOSIS — C9000 Multiple myeloma not having achieved remission: Secondary | ICD-10-CM

## 2021-06-04 MED ORDER — LENALIDOMIDE 5 MG PO CAPS: 5.0000 mg | ORAL_CAPSULE | Freq: Every day | ORAL | 0 refills | Status: DC

## 2021-06-04 NOTE — Telephone Encounter (Signed)
Pt requested refill for revlimid.  ?I called in revlimid refill via vm to [facility] by [employer]. ?

## 2021-06-11 NOTE — Telephone Encounter (Deleted)
Tom ?

## 2021-06-11 NOTE — Telephone Encounter (Signed)
I spoke with Rob, pharmD at [facility] regarding pts Revlimid. I provided Rob with a verbal order along with the [employer] auth. This was repeated back and acknowledged. I requested this order be handled STAT given pt has now been without his medication since 06/06/21. This was also acknowledged. ? ?I have spoken with the pt and advised of this. He expressed understanding of this information. While speaking with the pt, we identified that he does not need his lab appt wit [facility] next week as he is also seeing [facility] and will have labs there. Dr. Julien Nordmann confirmed it is ok to cancel our lab appt. Pt also had his last infusion on 04/25/21, therefore his 06/26/21 infusion date is too soon and needs to be change to on or about 07/17/21 to be 12 weeks apart. Pt also request to delay his OV with Dr. Julien Nordmann until that date.  ? ?I have sent a schedule message with the details/dates needed. Pt is aware and knows to expect a call from scheduling with his new dates. ?

## 2021-06-11 NOTE — Telephone Encounter (Signed)
Pt called stating he spoke with [facility] and was advised they did not have an order for his Revlimid. I shared with the pt that this information was left on their VM on 06/04/21. Pt is upset and very anxious and request we call [facility] right now with his prescription information. ?

## 2021-06-12 ENCOUNTER — Telehealth: Payer: Self-pay | Admitting: Internal Medicine

## 2021-06-12 NOTE — Telephone Encounter (Signed)
.  Called patient to schedule appointment per 5.9 inbasket, patient is aware of date and time.   ?

## 2021-06-19 ENCOUNTER — Other Ambulatory Visit: Payer: Medicare Other

## 2021-06-26 ENCOUNTER — Ambulatory Visit: Payer: Medicare Other | Admitting: Internal Medicine

## 2021-06-26 ENCOUNTER — Ambulatory Visit: Payer: Medicare Other

## 2021-06-27 ENCOUNTER — Telehealth: Payer: Self-pay

## 2021-06-27 NOTE — Telephone Encounter (Signed)
I LVM for pt advising a rx refill for Revlimid has been received but we cannot obtain the [employer] auth until 07/02/21.  Pt does try to get his requests in early but unfortunately, we are restricted with obtaining the authorizations.

## 2021-07-04 ENCOUNTER — Other Ambulatory Visit: Payer: Self-pay

## 2021-07-04 DIAGNOSIS — C9000 Multiple myeloma not having achieved remission: Secondary | ICD-10-CM

## 2021-07-04 MED ORDER — LENALIDOMIDE 5 MG PO CAPS: 5.0000 mg | ORAL_CAPSULE | Freq: Every day | ORAL | 0 refills | Status: DC

## 2021-07-04 NOTE — Telephone Encounter (Signed)
[employer] Josem Kaufmann [id number] obtained 07/04/21.  Rx has been faxed to [facility] and confirmation was received.

## 2021-07-04 NOTE — Telephone Encounter (Signed)
I have spoken with the pt to advise the auth was received and faxed. Pt expressed understanding of this information.

## 2021-07-11 ENCOUNTER — Telehealth: Payer: Self-pay | Admitting: Internal Medicine

## 2021-07-11 NOTE — Telephone Encounter (Signed)
.  Called patient to schedule appointment per 6/8 inbasket, patient is aware of date and time.   

## 2021-07-18 ENCOUNTER — Inpatient Hospital Stay: Payer: Medicare Other

## 2021-07-18 ENCOUNTER — Other Ambulatory Visit: Payer: Self-pay

## 2021-07-18 ENCOUNTER — Inpatient Hospital Stay: Payer: Medicare Other | Attending: Internal Medicine | Admitting: Internal Medicine

## 2021-07-18 VITALS — BP 142/79 | HR 73 | Temp 98.7°F | Resp 17 | Wt 241.4 lb

## 2021-07-18 DIAGNOSIS — C9001 Multiple myeloma in remission: Secondary | ICD-10-CM

## 2021-07-18 DIAGNOSIS — Z5111 Encounter for antineoplastic chemotherapy: Secondary | ICD-10-CM | POA: Diagnosis not present

## 2021-07-18 DIAGNOSIS — C9000 Multiple myeloma not having achieved remission: Secondary | ICD-10-CM | POA: Diagnosis present

## 2021-07-18 LAB — CBC WITH DIFFERENTIAL (CANCER CENTER ONLY)
Abs Immature Granulocytes: 0 10*3/uL (ref 0.00–0.07)
Basophils Absolute: 0.1 10*3/uL (ref 0.0–0.1)
Basophils Relative: 2 %
Eosinophils Absolute: 0.5 10*3/uL (ref 0.0–0.5)
Eosinophils Relative: 10 %
HCT: 40.5 % (ref 39.0–52.0)
Hemoglobin: 14.2 g/dL (ref 13.0–17.0)
Immature Granulocytes: 0 %
Lymphocytes Relative: 39 %
Lymphs Abs: 1.8 10*3/uL (ref 0.7–4.0)
MCH: 34.5 pg — ABNORMAL HIGH (ref 26.0–34.0)
MCHC: 35.1 g/dL (ref 30.0–36.0)
MCV: 98.5 fL (ref 80.0–100.0)
Monocytes Absolute: 0.7 10*3/uL (ref 0.1–1.0)
Monocytes Relative: 14 %
Neutro Abs: 1.7 10*3/uL (ref 1.7–7.7)
Neutrophils Relative %: 35 %
Platelet Count: 183 10*3/uL (ref 150–400)
RBC: 4.11 MIL/uL — ABNORMAL LOW (ref 4.22–5.81)
RDW: 13.4 % (ref 11.5–15.5)
WBC Count: 4.7 10*3/uL (ref 4.0–10.5)
nRBC: 0 % (ref 0.0–0.2)

## 2021-07-18 LAB — CMP (CANCER CENTER ONLY)
ALT: 22 U/L (ref 0–44)
AST: 20 U/L (ref 15–41)
Albumin: 3.9 g/dL (ref 3.5–5.0)
Alkaline Phosphatase: 56 U/L (ref 38–126)
Anion gap: 6 (ref 5–15)
BUN: 9 mg/dL (ref 8–23)
CO2: 29 mmol/L (ref 22–32)
Calcium: 9.2 mg/dL (ref 8.9–10.3)
Chloride: 106 mmol/L (ref 98–111)
Creatinine: 1.19 mg/dL (ref 0.61–1.24)
GFR, Estimated: >60 mL/min (ref >60)
Glucose, Bld: 99 mg/dL (ref 70–99)
Potassium: 3.6 mmol/L (ref 3.5–5.1)
Sodium: 141 mmol/L (ref 135–145)
Total Bilirubin: 0.7 mg/dL (ref 0.3–1.2)
Total Protein: 7.4 g/dL (ref 6.5–8.1)

## 2021-07-18 LAB — LACTATE DEHYDROGENASE: LDH: 123 U/L (ref 98–192)

## 2021-07-18 MED ORDER — SODIUM CHLORIDE 0.9 % IV SOLN: Freq: Once | INTRAVENOUS | Status: AC

## 2021-07-18 MED ORDER — ZOLEDRONIC ACID 4 MG/100ML IV SOLN
4.0000 mg | Freq: Once | INTRAVENOUS | Status: AC
  Administered 2021-07-18: 4 mg via INTRAVENOUS
  Filled 2021-07-18: qty 100

## 2021-07-18 NOTE — Patient Instructions (Signed)

## 2021-07-18 NOTE — Progress Notes (Signed)
[place] Telephone:(336) [phone]   Fax:(336) [phone]  OFFICE PROGRESS NOTE  Derinda Late, MD [phone] [address] [facility] [place] [address]  DIAGNOSIS: Multiple myeloma, IgG subtype diagnosed in January 2020.   PRIOR THERAPY:  1) Systemic chemotherapy with Velcade 1.3 mg/M2 on days 1, 8, 15 as well as Revlimid 25 mg p.o. daily for 21 days every 4 weeks as well as Decadron 40 mg weekly.  First dose expected March 22, 2018.  Status post 6 cycles. 2) autologous stem cell transplant at [facility] on 10/08/2018.   CURRENT THERAPY:   1) Maintenance Revlimid 10 mg p.o. daily.  This changed to Revlimid 5 mg p.o. daily because of the persistent drug-induced neutropenia. 2) Zometa infusion every 3 months.  INTERVAL HISTORY: Matthew Yates 71 y.o. male returns to the clinic today for follow-up visit.  The patient is feeling fine today with no concerning complaints.  He attended his daughter's graduation from high school recently.  He was very emotional.  He denied having any current chest pain, shortness of breath, cough or hemoptysis.  He has no nausea, vomiting, diarrhea or constipation.  He has no headache or visual changes.  The patient denied having any recent weight loss or night sweats.  He continues to tolerate his treatment with Revlimid fairly well.  He is here today for evaluation and repeat myeloma panel.   MEDICAL HISTORY: Past Medical History:  Diagnosis Date   Acute medial meniscus tear of left knee    Arthritis    lt knee   GERD (gastroesophageal reflux disease)    Hypertension     ALLERGIES:  is allergic to other.  MEDICATIONS:  Current Outpatient Medications  Medication Sig Dispense Refill   ALPRAZolam (XANAX) 0.25 MG tablet Take 1 tablet (0.25 mg total) by mouth at bedtime as needed for anxiety. 30 tablet 0   amLODipine (NORVASC) 5 MG tablet Take 5 mg by mouth daily.     fluticasone (FLONASE)  50 MCG/ACT nasal spray Place 2 sprays into both nostrils daily.     HYDROcodone-acetaminophen (NORCO) 7.5-325 MG tablet Take 1-2 tablets by mouth every 6 (six) hours as needed for moderate pain. 90 tablet 0   ibuprofen (ADVIL,MOTRIN) 400 MG tablet Take 400 mg by mouth every 6 (six) hours as needed.     lenalidomide (REVLIMID) 5 MG capsule Take 1 capsule (5 mg total) by mouth daily for 28 days. [name] #[id number] Date Obtained 07/04/2021 Adult male 24 capsule 0   loratadine-pseudoephedrine (CLARITIN-D 12-HOUR) 5-120 MG tablet Take 1 tablet by mouth 2 (two) times daily.     LORazepam (ATIVAN) 0.5 MG tablet Take by mouth.     valACYclovir (VALTREX) 500 MG tablet Take 500 mg by mouth daily.     vitamin B-12 (CYANOCOBALAMIN) 1000 MCG tablet Take by mouth.     No current facility-administered medications for this visit.    SURGICAL HISTORY:  Past Surgical History:  Procedure Laterality Date   CHONDROPLASTY Left 07/09/2016   Procedure: CHONDROPLASTY with MICROFRACTURE;  Surgeon: Leandrew Koyanagi, MD;  Location: [place];  Service: Orthopedics;  Laterality: Left;   COLONOSCOPY     x2   HIP SURGERY     aspiration only, no anesthesia   KNEE ARTHROSCOPY WITH MEDIAL MENISECTOMY Left 07/09/2016   Procedure: LEFT KNEE ARTHROSCOPY WITH PARTIAL MEDIAL MENISCECTOMY;  Surgeon: Leandrew Koyanagi, MD;  Location: [facility];  Service: Orthopedics;  Laterality: Left;    REVIEW OF SYSTEMS:  A comprehensive review of systems was negative.   PHYSICAL EXAMINATION: General appearance: alert, cooperative, appears stated age, and no distress Head: Normocephalic, without obvious abnormality, atraumatic Neck: no adenopathy, no JVD, supple, symmetrical, trachea midline, and thyroid not enlarged, symmetric, no tenderness/mass/nodules Lymph nodes: Cervical, supraclavicular, and axillary nodes normal. Resp: clear to auscultation bilaterally Back: symmetric, no curvature. ROM normal. No CVA  tenderness. Cardio: regular rate and rhythm, S1, S2 normal, no murmur, click, rub or gallop GI: soft, non-tender; bowel sounds normal; no masses,  no organomegaly Extremities: extremities normal, atraumatic, no cyanosis or edema  ECOG PERFORMANCE STATUS: 1 - Symptomatic but completely ambulatory  Blood pressure (!) 142/79, pulse 73, temperature 98.7 F (37.1 C), temperature source Tympanic, resp. rate 17, weight 241 lb 7 oz (109.5 kg), SpO2 98 %.  LABORATORY DATA: Lab Results  Component Value Date   WBC 4.7 07/18/2021   HGB 14.2 07/18/2021   HCT 40.5 07/18/2021   MCV 98.5 07/18/2021   PLT 183 07/18/2021      Chemistry      Component Value Date/Time   NA 141 07/18/2021 0920   K 3.6 07/18/2021 0920   CL 106 07/18/2021 0920   CO2 29 07/18/2021 0920   BUN 9 07/18/2021 0920   CREATININE 1.19 07/18/2021 0920      Component Value Date/Time   CALCIUM 9.2 07/18/2021 0920   ALKPHOS 56 07/18/2021 0920   AST 20 07/18/2021 0920   ALT 22 07/18/2021 0920   BILITOT 0.7 07/18/2021 0920       RADIOGRAPHIC STUDIES: No results found.  ASSESSMENT AND PLAN: This is a very pleasant 71 years old African-American male recently diagnosed with multiple myeloma, IgG subtype.  He is currently undergoing treatment with Revlimid, Velcade and Decadron status post 6 cycles. The patient tolerated his treatment well with no concerning complaints except for mild peripheral neuropathy. The patient has very good response to this treatment. On October 08, 2018 he underwent autologous stem cell transplant at [facility] and tolerated the procedure well  The patient is currently on maintenance treatment with Revlimid 10 mg p.o. daily status post 21  months of treatment.  This was changed to Revlimid 5 mg p.o. daily secondary to neutropenia.  He continues to tolerate this treatment well with no concerning adverse effects. He has repeat myeloma panel performed today.  The protein study still pending but  his CBC and comprehensive metabolic panel are unremarkable. I recommended for the patient to continue his current treatment with Revlimid 5 mg p.o. daily. For the diarrhea he was advised to take Imodium after every episode of diarrhea with a maximum of 8 tablets a day. For the bone disease, he will continue his current treatment with Zometa every 3 months. For the peripheral neuropathy, he is currently on gabapentin. I will see the patient back for follow-up visit in 3 months for evaluation and repeat myeloma panel. The patient voices understanding of current disease status and treatment options and is in agreement with the current care plan. All questions were answered. The patient knows to call the clinic with any problems, questions or concerns. We can certainly see the patient much sooner if necessary.    Disclaimer: This note was dictated with voice recognition software. Similar sounding words can inadvertently be transcribed and may not be corrected upon review.

## 2021-07-19 LAB — BETA 2 MICROGLOBULIN, SERUM: Beta-2 Microglobulin: 1.9 mg/L (ref 0.6–2.4)

## 2021-07-19 LAB — KAPPA/LAMBDA LIGHT CHAINS
Kappa free light chain: 60.5 mg/L — ABNORMAL HIGH (ref 3.3–19.4)
Kappa, lambda light chain ratio: 2.69 — ABNORMAL HIGH (ref 0.26–1.65)
Lambda free light chains: 22.5 mg/L (ref 5.7–26.3)

## 2021-07-20 LAB — IGG, IGA, IGM
IgA: 254 mg/dL (ref 61–437)
IgG (Immunoglobin G), Serum: 1439 mg/dL (ref 603–1613)
IgM (Immunoglobulin M), Srm: 23 mg/dL (ref 20–172)

## 2021-08-08 ENCOUNTER — Other Ambulatory Visit: Payer: Self-pay | Admitting: *Deleted

## 2021-08-08 DIAGNOSIS — C9000 Multiple myeloma not having achieved remission: Secondary | ICD-10-CM

## 2021-08-08 MED ORDER — LENALIDOMIDE 5 MG PO CAPS: 5.0000 mg | ORAL_CAPSULE | Freq: Every day | ORAL | 0 refills | Status: DC

## 2021-08-08 NOTE — Telephone Encounter (Signed)
Contacted by patient for refill of Revlimid. Prescription escribed to [facility] per MD OV note 07/18/21.

## 2021-08-26 ENCOUNTER — Other Ambulatory Visit: Payer: Self-pay

## 2021-09-09 ENCOUNTER — Other Ambulatory Visit: Payer: Self-pay | Admitting: Medical Oncology

## 2021-09-09 DIAGNOSIS — C9000 Multiple myeloma not having achieved remission: Secondary | ICD-10-CM

## 2021-09-09 MED ORDER — LENALIDOMIDE 5 MG PO CAPS: 5.0000 mg | ORAL_CAPSULE | Freq: Every day | ORAL | 0 refills | Status: DC

## 2021-09-24 ENCOUNTER — Ambulatory Visit: Payer: Medicare Other

## 2021-10-10 ENCOUNTER — Inpatient Hospital Stay: Payer: Medicare Other

## 2021-10-10 ENCOUNTER — Inpatient Hospital Stay: Payer: Medicare Other | Attending: Internal Medicine

## 2021-10-10 ENCOUNTER — Telehealth: Payer: Self-pay

## 2021-10-10 ENCOUNTER — Other Ambulatory Visit: Payer: Self-pay

## 2021-10-10 VITALS — BP 145/80 | HR 80 | Temp 98.8°F | Resp 20

## 2021-10-10 DIAGNOSIS — Z79899 Other long term (current) drug therapy: Secondary | ICD-10-CM | POA: Insufficient documentation

## 2021-10-10 DIAGNOSIS — G629 Polyneuropathy, unspecified: Secondary | ICD-10-CM | POA: Diagnosis not present

## 2021-10-10 DIAGNOSIS — C9001 Multiple myeloma in remission: Secondary | ICD-10-CM

## 2021-10-10 DIAGNOSIS — C9000 Multiple myeloma not having achieved remission: Secondary | ICD-10-CM | POA: Diagnosis present

## 2021-10-10 DIAGNOSIS — Z9484 Stem cells transplant status: Secondary | ICD-10-CM | POA: Diagnosis not present

## 2021-10-10 DIAGNOSIS — D701 Agranulocytosis secondary to cancer chemotherapy: Secondary | ICD-10-CM | POA: Insufficient documentation

## 2021-10-10 DIAGNOSIS — T451X5A Adverse effect of antineoplastic and immunosuppressive drugs, initial encounter: Secondary | ICD-10-CM | POA: Diagnosis not present

## 2021-10-10 LAB — CBC WITH DIFFERENTIAL (CANCER CENTER ONLY)
Abs Immature Granulocytes: 0.01 10*3/uL (ref 0.00–0.07)
Basophils Absolute: 0.1 10*3/uL (ref 0.0–0.1)
Basophils Relative: 2 %
Eosinophils Absolute: 0.4 10*3/uL (ref 0.0–0.5)
Eosinophils Relative: 9 %
HCT: 40.1 % (ref 39.0–52.0)
Hemoglobin: 14.3 g/dL (ref 13.0–17.0)
Immature Granulocytes: 0 %
Lymphocytes Relative: 45 %
Lymphs Abs: 2.2 10*3/uL (ref 0.7–4.0)
MCH: 34.8 pg — ABNORMAL HIGH (ref 26.0–34.0)
MCHC: 35.7 g/dL (ref 30.0–36.0)
MCV: 97.6 fL (ref 80.0–100.0)
Monocytes Absolute: 0.5 10*3/uL (ref 0.1–1.0)
Monocytes Relative: 10 %
Neutro Abs: 1.6 10*3/uL — ABNORMAL LOW (ref 1.7–7.7)
Neutrophils Relative %: 34 %
Platelet Count: 201 10*3/uL (ref 150–400)
RBC: 4.11 MIL/uL — ABNORMAL LOW (ref 4.22–5.81)
RDW: 13.9 % (ref 11.5–15.5)
WBC Count: 4.8 10*3/uL (ref 4.0–10.5)
nRBC: 0 % (ref 0.0–0.2)

## 2021-10-10 LAB — CMP (CANCER CENTER ONLY)
ALT: 19 U/L (ref 0–44)
AST: 19 U/L (ref 15–41)
Albumin: 4 g/dL (ref 3.5–5.0)
Alkaline Phosphatase: 54 U/L (ref 38–126)
Anion gap: 5 (ref 5–15)
BUN: 10 mg/dL (ref 8–23)
CO2: 30 mmol/L (ref 22–32)
Calcium: 8.9 mg/dL (ref 8.9–10.3)
Chloride: 106 mmol/L (ref 98–111)
Creatinine: 1.27 mg/dL — ABNORMAL HIGH (ref 0.61–1.24)
GFR, Estimated: >60 mL/min (ref >60)
Glucose, Bld: 104 mg/dL — ABNORMAL HIGH (ref 70–99)
Potassium: 3 mmol/L — ABNORMAL LOW (ref 3.5–5.1)
Sodium: 141 mmol/L (ref 135–145)
Total Bilirubin: 0.6 mg/dL (ref 0.3–1.2)
Total Protein: 7.5 g/dL (ref 6.5–8.1)

## 2021-10-10 LAB — LACTATE DEHYDROGENASE: LDH: 122 U/L (ref 98–192)

## 2021-10-10 MED ORDER — SODIUM CHLORIDE 0.9 % IV SOLN: Freq: Once | INTRAVENOUS | Status: AC

## 2021-10-10 MED ORDER — ZOLEDRONIC ACID 4 MG/100ML IV SOLN
4.0000 mg | Freq: Once | INTRAVENOUS | Status: AC
  Administered 2021-10-10: 4 mg via INTRAVENOUS
  Filled 2021-10-10: qty 100

## 2021-10-10 NOTE — Telephone Encounter (Signed)
Pt has tx today at 1:15pm but needs a lab appt prior to. I have scheduled him for 12:45pm and LVM for pt to please come earlier for th elab appt.

## 2021-10-10 NOTE — Patient Instructions (Signed)

## 2021-10-11 ENCOUNTER — Telehealth: Payer: Self-pay

## 2021-10-11 DIAGNOSIS — C9000 Multiple myeloma not having achieved remission: Secondary | ICD-10-CM

## 2021-10-11 MED ORDER — LENALIDOMIDE 5 MG PO CAPS: 5.0000 mg | ORAL_CAPSULE | Freq: Every day | ORAL | 0 refills | Status: DC

## 2021-10-11 NOTE — Telephone Encounter (Signed)
Pt LM requesting a refill or Revlimid '5mg'$ .  I have obtained the [employer] auth on 10/11/21: [id number] and rx refill has been sent.  I have called the tp back and advised of this.

## 2021-10-12 LAB — IGG, IGA, IGM
IgA: 282 mg/dL (ref 61–437)
IgG (Immunoglobin G), Serum: 1509 mg/dL (ref 603–1613)
IgM (Immunoglobulin M), Srm: 24 mg/dL (ref 20–172)

## 2021-10-12 LAB — BETA 2 MICROGLOBULIN, SERUM: Beta-2 Microglobulin: 1.9 mg/L (ref 0.6–2.4)

## 2021-10-14 ENCOUNTER — Telehealth: Payer: Self-pay

## 2021-10-14 LAB — KAPPA/LAMBDA LIGHT CHAINS
Kappa free light chain: 65 mg/L — ABNORMAL HIGH (ref 3.3–19.4)
Kappa, lambda light chain ratio: 2.78 — ABNORMAL HIGH (ref 0.26–1.65)
Lambda free light chains: 23.4 mg/L (ref 5.7–26.3)

## 2021-10-14 NOTE — Telephone Encounter (Signed)
T/C to pt to advised of low potassium and to increase foods high in potassium.  Foods list reviewed and pt agreed with this plan.

## 2021-10-17 ENCOUNTER — Other Ambulatory Visit: Payer: Self-pay

## 2021-10-17 ENCOUNTER — Inpatient Hospital Stay (HOSPITAL_BASED_OUTPATIENT_CLINIC_OR_DEPARTMENT_OTHER): Payer: Medicare Other | Admitting: Internal Medicine

## 2021-10-17 ENCOUNTER — Encounter: Payer: Self-pay | Admitting: Internal Medicine

## 2021-10-17 ENCOUNTER — Inpatient Hospital Stay: Payer: Medicare Other

## 2021-10-17 VITALS — BP 153/83 | HR 75 | Temp 98.4°F | Resp 15 | Wt 248.0 lb

## 2021-10-17 DIAGNOSIS — C9001 Multiple myeloma in remission: Secondary | ICD-10-CM

## 2021-10-17 DIAGNOSIS — C9000 Multiple myeloma not having achieved remission: Secondary | ICD-10-CM

## 2021-10-17 NOTE — Progress Notes (Signed)
[place] Telephone:(336) [phone]   Fax:(336) [phone]  OFFICE PROGRESS NOTE  Derinda Late, MD [phone] [address] [facility] [place] [address]  DIAGNOSIS: Multiple myeloma, IgG subtype diagnosed in January 2020.   PRIOR THERAPY:  1) Systemic chemotherapy with Velcade 1.3 mg/M2 on days 1, 8, 15 as well as Revlimid 25 mg p.o. daily for 21 days every 4 weeks as well as Decadron 40 mg weekly.  First dose expected March 22, 2018.  Status post 6 cycles. 2) autologous stem cell transplant at [facility] on 10/08/2018.   CURRENT THERAPY:   1) Maintenance Revlimid 10 mg p.o. daily.  This changed to Revlimid 5 mg p.o. daily because of the persistent drug-induced neutropenia. 2) Zometa infusion every 3 months.  INTERVAL HISTORY: Matthew Yates 71 y.o. male returns to the clinic today for follow-up visit.  The patient is feeling fine today with no concerning complaints.  He denied having any chest pain, shortness of breath, cough or hemoptysis.  He has no nausea, vomiting, diarrhea or constipation.  He has no headache or visual changes.  He denied having any weight loss or night sweats.  He continues to 3 with his Revlimid fairly well.  He received his Zometa infusion last week.  He is here today for evaluation with repeat myeloma panel.  MEDICAL HISTORY: Past Medical History:  Diagnosis Date   Acute medial meniscus tear of left knee    Arthritis    lt knee   GERD (gastroesophageal reflux disease)    Hypertension     ALLERGIES:  is allergic to other.  MEDICATIONS:  Current Outpatient Medications  Medication Sig Dispense Refill   ALPRAZolam (XANAX) 0.25 MG tablet Take 1 tablet (0.25 mg total) by mouth at bedtime as needed for anxiety. 30 tablet 0   amLODipine (NORVASC) 5 MG tablet Take 5 mg by mouth daily.     fluticasone (FLONASE) 50 MCG/ACT nasal spray Place 2 sprays into both nostrils daily.      HYDROcodone-acetaminophen (NORCO) 7.5-325 MG tablet Take 1-2 tablets by mouth every 6 (six) hours as needed for moderate pain. 90 tablet 0   ibuprofen (ADVIL,MOTRIN) 400 MG tablet Take 400 mg by mouth every 6 (six) hours as needed.     lenalidomide (REVLIMID) 5 MG capsule Take 1 capsule (5 mg total) by mouth daily for 28 days. Continuously 28 capsule 0   loratadine-pseudoephedrine (CLARITIN-D 12-HOUR) 5-120 MG tablet Take 1 tablet by mouth 2 (two) times daily.     LORazepam (ATIVAN) 0.5 MG tablet Take by mouth.     valACYclovir (VALTREX) 500 MG tablet Take 500 mg by mouth daily.     vitamin B-12 (CYANOCOBALAMIN) 1000 MCG tablet Take by mouth.     No current facility-administered medications for this visit.    SURGICAL HISTORY:  Past Surgical History:  Procedure Laterality Date   CHONDROPLASTY Left 07/09/2016   Procedure: CHONDROPLASTY with MICROFRACTURE;  Surgeon: Leandrew Koyanagi, MD;  Location: [place];  Service: Orthopedics;  Laterality: Left;   COLONOSCOPY     x2   HIP SURGERY     aspiration only, no anesthesia   KNEE ARTHROSCOPY WITH MEDIAL MENISECTOMY Left 07/09/2016   Procedure: LEFT KNEE ARTHROSCOPY WITH PARTIAL MEDIAL MENISCECTOMY;  Surgeon: Leandrew Koyanagi, MD;  Location: [place];  Service: Orthopedics;  Laterality: Left;    REVIEW OF SYSTEMS:  A comprehensive review of systems  was negative.   PHYSICAL EXAMINATION: General appearance: alert, cooperative, appears stated age, and no distress Head: Normocephalic, without obvious abnormality, atraumatic Neck: no adenopathy, no JVD, supple, symmetrical, trachea midline, and thyroid not enlarged, symmetric, no tenderness/mass/nodules Lymph nodes: Cervical, supraclavicular, and axillary nodes normal. Resp: clear to auscultation bilaterally Back: symmetric, no curvature. ROM normal. No CVA tenderness. Cardio: regular rate and rhythm, S1, S2 normal, no murmur, click, rub or gallop GI: soft, non-tender;  bowel sounds normal; no masses,  no organomegaly Extremities: extremities normal, atraumatic, no cyanosis or edema  ECOG PERFORMANCE STATUS: 1 - Symptomatic but completely ambulatory  Blood pressure (!) 153/83, pulse 75, temperature 98.4 F (36.9 C), temperature source Oral, resp. rate 15, weight 248 lb (112.5 kg), SpO2 99 %.  LABORATORY DATA: Lab Results  Component Value Date   WBC 4.8 10/10/2021   HGB 14.3 10/10/2021   HCT 40.1 10/10/2021   MCV 97.6 10/10/2021   PLT 201 10/10/2021      Chemistry      Component Value Date/Time   NA 141 10/10/2021 1303   K 3.0 (L) 10/10/2021 1303   CL 106 10/10/2021 1303   CO2 30 10/10/2021 1303   BUN 10 10/10/2021 1303   CREATININE 1.27 (H) 10/10/2021 1303      Component Value Date/Time   CALCIUM 8.9 10/10/2021 1303   ALKPHOS 54 10/10/2021 1303   AST 19 10/10/2021 1303   ALT 19 10/10/2021 1303   BILITOT 0.6 10/10/2021 1303       RADIOGRAPHIC STUDIES: No results found.  ASSESSMENT AND PLAN: This is a very pleasant 71 years old African-American male recently diagnosed with multiple myeloma, IgG subtype.  He is currently undergoing treatment with Revlimid, Velcade and Decadron status post 6 cycles. The patient tolerated his treatment well with no concerning complaints except for mild peripheral neuropathy. The patient has very good response to this treatment. On October 08, 2018 he underwent autologous stem cell transplant at [facility] and tolerated the procedure well  The patient is currently on maintenance treatment with Revlimid 10 mg p.o. daily status post 21  months of treatment.  This was changed to Revlimid 5 mg p.o. daily secondary to neutropenia.   The patient has been tolerating this treatment well with no concerning adverse effects. He had repeat myeloma panel performed recently.  I discussed the lab result with the patient and there is no concerning findings for disease progression. I recommended for him to continue  his treatment with maintenance Revlimid 5 mg p.o. daily. I will see him back for follow-up visit in 3 months for evaluation repeat myeloma panel. For the bone disease from the multiple myeloma, he will continue Zometa every 3 months. For the peripheral neuropathy, he will continue his treatment with gabapentin. The patient was advised to call immediately if he has any other concerning symptoms in the interval.  The patient voices understanding of current disease status and treatment options and is in agreement with the current care plan. All questions were answered. The patient knows to call the clinic with any problems, questions or concerns. We can certainly see the patient much sooner if necessary.    Disclaimer: This note was dictated with voice recognition software. Similar sounding words can inadvertently be transcribed and may not be corrected upon review.

## 2021-10-18 ENCOUNTER — Telehealth: Payer: Self-pay | Admitting: Internal Medicine

## 2021-10-18 NOTE — Telephone Encounter (Signed)
Scheduled follow-up appointments per 9/14 los. Patient is aware. 

## 2021-10-19 ENCOUNTER — Other Ambulatory Visit: Payer: Self-pay

## 2021-10-20 ENCOUNTER — Other Ambulatory Visit: Payer: Self-pay

## 2021-11-07 ENCOUNTER — Other Ambulatory Visit: Payer: Self-pay

## 2021-11-07 DIAGNOSIS — C9000 Multiple myeloma not having achieved remission: Secondary | ICD-10-CM

## 2021-11-07 MED ORDER — LENALIDOMIDE 5 MG PO CAPS: 5.0000 mg | ORAL_CAPSULE | Freq: Every day | ORAL | 0 refills | Status: DC

## 2021-11-13 ENCOUNTER — Other Ambulatory Visit: Payer: Self-pay | Admitting: Physician Assistant

## 2021-11-26 ENCOUNTER — Other Ambulatory Visit (HOSPITAL_COMMUNITY): Payer: Self-pay

## 2021-12-06 ENCOUNTER — Other Ambulatory Visit (HOSPITAL_COMMUNITY): Payer: Self-pay

## 2021-12-09 ENCOUNTER — Telehealth: Payer: Self-pay | Admitting: Pharmacy Technician

## 2021-12-09 NOTE — Telephone Encounter (Signed)
[name] Encounter   Received notification that patient is due for re-enrollment for assistance for Revlimid through [employer].   Re-enrollment process has been initiated and will be submitted upon completion of necessary documents.  Patient requested application be mailed to them. Address confirmed: [address] [place], [place] [address]  [employer] phone number [phone].   I will continue to follow until final determination.  Lady Deutscher, CPhT-Adv Oncology Pharmacy Patient [place] Direct Number: [phone]  Fax: [phone]

## 2021-12-11 ENCOUNTER — Other Ambulatory Visit: Payer: Self-pay | Admitting: Medical Oncology

## 2021-12-11 DIAGNOSIS — C9000 Multiple myeloma not having achieved remission: Secondary | ICD-10-CM

## 2021-12-11 MED ORDER — LENALIDOMIDE 5 MG PO CAPS: 5.0000 mg | ORAL_CAPSULE | Freq: Every day | ORAL | 0 refills | Status: DC

## 2021-12-11 NOTE — Telephone Encounter (Signed)
Revlimid refill called in .

## 2021-12-24 ENCOUNTER — Ambulatory Visit: Payer: Medicare Other

## 2021-12-31 NOTE — Telephone Encounter (Signed)
[name] Encounter   Submitted application for assistance for Revlimid to [employer].   Application submitted via e-fax to [phone]   [facility] phone number [phone].   I will continue to check the status until final determination.   Lady Deutscher, CPhT-Adv Oncology Pharmacy Patient [place] Direct Number: [phone]  Fax: [phone]

## 2022-01-02 ENCOUNTER — Ambulatory Visit: Payer: Medicare Other

## 2022-01-06 ENCOUNTER — Other Ambulatory Visit: Payer: Self-pay | Admitting: Medical Oncology

## 2022-01-06 DIAGNOSIS — C9000 Multiple myeloma not having achieved remission: Secondary | ICD-10-CM

## 2022-01-06 MED ORDER — LENALIDOMIDE 5 MG PO CAPS: 5.0000 mg | ORAL_CAPSULE | Freq: Every day | ORAL | 0 refills | Status: DC

## 2022-01-06 NOTE — Progress Notes (Signed)
Revlimid refill called to [facility].

## 2022-01-07 ENCOUNTER — Inpatient Hospital Stay: Payer: Medicare Other | Attending: Internal Medicine

## 2022-01-07 DIAGNOSIS — C9000 Multiple myeloma not having achieved remission: Secondary | ICD-10-CM | POA: Insufficient documentation

## 2022-01-07 DIAGNOSIS — C9001 Multiple myeloma in remission: Secondary | ICD-10-CM

## 2022-01-07 LAB — CMP (CANCER CENTER ONLY)
ALT: 17 U/L (ref 0–44)
AST: 16 U/L (ref 15–41)
Albumin: 3.9 g/dL (ref 3.5–5.0)
Alkaline Phosphatase: 56 U/L (ref 38–126)
Anion gap: 5 (ref 5–15)
BUN: 9 mg/dL (ref 8–23)
CO2: 29 mmol/L (ref 22–32)
Calcium: 9.5 mg/dL (ref 8.9–10.3)
Chloride: 105 mmol/L (ref 98–111)
Creatinine: 1.23 mg/dL (ref 0.61–1.24)
GFR, Estimated: >60 mL/min (ref >60)
Glucose, Bld: 100 mg/dL — ABNORMAL HIGH (ref 70–99)
Potassium: 3.8 mmol/L (ref 3.5–5.1)
Sodium: 139 mmol/L (ref 135–145)
Total Bilirubin: 0.6 mg/dL (ref 0.3–1.2)
Total Protein: 7.6 g/dL (ref 6.5–8.1)

## 2022-01-07 LAB — CBC WITH DIFFERENTIAL (CANCER CENTER ONLY)
Abs Immature Granulocytes: 0 10*3/uL (ref 0.00–0.07)
Basophils Absolute: 0.1 10*3/uL (ref 0.0–0.1)
Basophils Relative: 2 %
Eosinophils Absolute: 0.4 10*3/uL (ref 0.0–0.5)
Eosinophils Relative: 9 %
HCT: 40.5 % (ref 39.0–52.0)
Hemoglobin: 14.3 g/dL (ref 13.0–17.0)
Immature Granulocytes: 0 %
Lymphocytes Relative: 42 %
Lymphs Abs: 2.1 10*3/uL (ref 0.7–4.0)
MCH: 35 pg — ABNORMAL HIGH (ref 26.0–34.0)
MCHC: 35.3 g/dL (ref 30.0–36.0)
MCV: 99 fL (ref 80.0–100.0)
Monocytes Absolute: 0.7 10*3/uL (ref 0.1–1.0)
Monocytes Relative: 15 %
Neutro Abs: 1.5 10*3/uL — ABNORMAL LOW (ref 1.7–7.7)
Neutrophils Relative %: 32 %
Platelet Count: 205 10*3/uL (ref 150–400)
RBC: 4.09 MIL/uL — ABNORMAL LOW (ref 4.22–5.81)
RDW: 13.6 % (ref 11.5–15.5)
WBC Count: 4.9 10*3/uL (ref 4.0–10.5)
nRBC: 0 % (ref 0.0–0.2)

## 2022-01-07 LAB — LACTATE DEHYDROGENASE: LDH: 119 U/L (ref 98–192)

## 2022-01-08 LAB — KAPPA/LAMBDA LIGHT CHAINS
Kappa free light chain: 74.1 mg/L — ABNORMAL HIGH (ref 3.3–19.4)
Kappa, lambda light chain ratio: 2.68 — ABNORMAL HIGH (ref 0.26–1.65)
Lambda free light chains: 27.7 mg/L — ABNORMAL HIGH (ref 5.7–26.3)

## 2022-01-08 LAB — BETA 2 MICROGLOBULIN, SERUM: Beta-2 Microglobulin: 2 mg/L (ref 0.6–2.4)

## 2022-01-09 LAB — IGG, IGA, IGM
IgA: 336 mg/dL (ref 61–437)
IgG (Immunoglobin G), Serum: 1550 mg/dL (ref 603–1613)
IgM (Immunoglobulin M), Srm: 29 mg/dL (ref 20–172)

## 2022-01-13 ENCOUNTER — Telehealth: Payer: Self-pay | Admitting: Medical Oncology

## 2022-01-13 DIAGNOSIS — C9000 Multiple myeloma not having achieved remission: Secondary | ICD-10-CM

## 2022-01-13 MED ORDER — LENALIDOMIDE 5 MG PO CAPS: 5.0000 mg | ORAL_CAPSULE | Freq: Every day | ORAL | 0 refills | Status: DC

## 2022-01-13 NOTE — Telephone Encounter (Signed)
Faxed  revlimid refill. 

## 2022-01-14 ENCOUNTER — Other Ambulatory Visit: Payer: Self-pay

## 2022-01-14 ENCOUNTER — Inpatient Hospital Stay: Payer: Medicare Other

## 2022-01-14 ENCOUNTER — Inpatient Hospital Stay (HOSPITAL_BASED_OUTPATIENT_CLINIC_OR_DEPARTMENT_OTHER): Payer: Medicare Other | Admitting: Internal Medicine

## 2022-01-14 VITALS — BP 141/79 | HR 68 | Temp 98.4°F | Resp 16 | Wt 238.8 lb

## 2022-01-14 DIAGNOSIS — C9000 Multiple myeloma not having achieved remission: Secondary | ICD-10-CM

## 2022-01-14 DIAGNOSIS — C9001 Multiple myeloma in remission: Secondary | ICD-10-CM

## 2022-01-14 MED ORDER — ZOLEDRONIC ACID 4 MG/100ML IV SOLN
4.0000 mg | Freq: Once | INTRAVENOUS | Status: AC
  Administered 2022-01-14: 4 mg via INTRAVENOUS
  Filled 2022-01-14: qty 100

## 2022-01-14 MED ORDER — SODIUM CHLORIDE 0.9 % IV SOLN: Freq: Once | INTRAVENOUS | Status: AC

## 2022-01-14 NOTE — Progress Notes (Signed)
[place] Telephone:(336) [phone]   Fax:(336) [phone]  OFFICE PROGRESS NOTE  Derinda Late, MD [phone] [address] [facility] [place] [address]  DIAGNOSIS: Multiple myeloma, IgG subtype diagnosed in January 2020.   PRIOR THERAPY:  1) Systemic chemotherapy with Velcade 1.3 mg/M2 on days 1, 8, 15 as well as Revlimid 25 mg p.o. daily for 21 days every 4 weeks as well as Decadron 40 mg weekly.  First dose expected March 22, 2018.  Status post 6 cycles. 2) autologous stem cell transplant at [facility] on 10/08/2018.   CURRENT THERAPY:   1) Maintenance Revlimid 10 mg p.o. daily.  This changed to Revlimid 5 mg p.o. daily because of the persistent drug-induced neutropenia. 2) Zometa infusion every 3 months.  INTERVAL HISTORY: Matthew Yates 71 y.o. male returns to the clinic today for follow-up visit.  The patient is feeling fine today with no concerning complaints.  He denied having any current chest pain, shortness of breath, cough or hemoptysis.  He has no nausea, vomiting, diarrhea or constipation.  He has no headache or visual changes.  He denied having any significant weight loss or night sweats.  He is here today for evaluation with repeat myeloma panel.  MEDICAL HISTORY: Past Medical History:  Diagnosis Date   Acute medial meniscus tear of left knee    Arthritis    lt knee   GERD (gastroesophageal reflux disease)    Hypertension     ALLERGIES:  is allergic to other.  MEDICATIONS:  Current Outpatient Medications  Medication Sig Dispense Refill   ALPRAZolam (XANAX) 0.25 MG tablet Take 1 tablet (0.25 mg total) by mouth at bedtime as needed for anxiety. 30 tablet 0   amLODipine (NORVASC) 5 MG tablet Take 5 mg by mouth daily.     fluticasone (FLONASE) 50 MCG/ACT nasal spray Place 2 sprays into both nostrils daily.     HYDROcodone-acetaminophen (NORCO) 7.5-325 MG tablet Take 1-2 tablets by mouth every 6 (six)  hours as needed for moderate pain. 90 tablet 0   ibuprofen (ADVIL,MOTRIN) 400 MG tablet Take 400 mg by mouth every 6 (six) hours as needed.     lenalidomide (REVLIMID) 5 MG capsule Take 1 capsule (5 mg total) by mouth daily for 28 days. Auth # [id number] 28 capsule 0   loratadine-pseudoephedrine (CLARITIN-D 12-HOUR) 5-120 MG tablet Take 1 tablet by mouth 2 (two) times daily.     LORazepam (ATIVAN) 0.5 MG tablet Take by mouth.     valACYclovir (VALTREX) 500 MG tablet Take 500 mg by mouth daily.     vitamin B-12 (CYANOCOBALAMIN) 1000 MCG tablet Take by mouth.     No current facility-administered medications for this visit.    SURGICAL HISTORY:  Past Surgical History:  Procedure Laterality Date   CHONDROPLASTY Left 07/09/2016   Procedure: CHONDROPLASTY with MICROFRACTURE;  Surgeon: Leandrew Koyanagi, MD;  Location: [place];  Service: Orthopedics;  Laterality: Left;   COLONOSCOPY     x2   HIP SURGERY     aspiration only, no anesthesia   KNEE ARTHROSCOPY WITH MEDIAL MENISECTOMY Left 07/09/2016   Procedure: LEFT KNEE ARTHROSCOPY WITH PARTIAL MEDIAL MENISCECTOMY;  Surgeon: Leandrew Koyanagi, MD;  Location: [place];  Service: Orthopedics;  Laterality: Left;    REVIEW OF SYSTEMS:  Constitutional: negative Eyes: negative Ears, nose, mouth, throat, and face: negative Respiratory: negative Cardiovascular: negative Gastrointestinal: negative Genitourinary:negative Integument/breast:  negative Hematologic/lymphatic: negative Musculoskeletal:negative Neurological: negative Behavioral/Psych: negative Endocrine: negative Allergic/Immunologic: negative   PHYSICAL EXAMINATION: General appearance: alert, cooperative, appears stated age, and no distress Head: Normocephalic, without obvious abnormality, atraumatic Neck: no adenopathy, no JVD, supple, symmetrical, trachea midline, and thyroid not enlarged, symmetric, no tenderness/mass/nodules Lymph nodes: Cervical,  supraclavicular, and axillary nodes normal. Resp: clear to auscultation bilaterally Back: symmetric, no curvature. ROM normal. No CVA tenderness. Cardio: regular rate and rhythm, S1, S2 normal, no murmur, click, rub or gallop GI: soft, non-tender; bowel sounds normal; no masses,  no organomegaly Extremities: extremities normal, atraumatic, no cyanosis or edema Neurologic: Alert and oriented X 3, normal strength and tone. Normal symmetric reflexes. Normal coordination and gait  ECOG PERFORMANCE STATUS: 1 - Symptomatic but completely ambulatory  Blood pressure (!) 141/79, pulse 68, temperature 98.4 F (36.9 C), temperature source Oral, resp. rate 16, weight 238 lb 12.8 oz (108.3 kg), SpO2 99 %.  LABORATORY DATA: Lab Results  Component Value Date   WBC 4.9 01/07/2022   HGB 14.3 01/07/2022   HCT 40.5 01/07/2022   MCV 99.0 01/07/2022   PLT 205 01/07/2022      Chemistry      Component Value Date/Time   NA 139 01/07/2022 1340   K 3.8 01/07/2022 1340   CL 105 01/07/2022 1340   CO2 29 01/07/2022 1340   BUN 9 01/07/2022 1340   CREATININE 1.23 01/07/2022 1340      Component Value Date/Time   CALCIUM 9.5 01/07/2022 1340   ALKPHOS 56 01/07/2022 1340   AST 16 01/07/2022 1340   ALT 17 01/07/2022 1340   BILITOT 0.6 01/07/2022 1340       RADIOGRAPHIC STUDIES: No results found.  ASSESSMENT AND PLAN: This is a very pleasant 71 years old African-American male recently diagnosed with multiple myeloma, IgG subtype.  He is currently undergoing treatment with Revlimid, Velcade and Decadron status post 6 cycles. The patient tolerated his treatment well with no concerning complaints except for mild peripheral neuropathy. The patient has very good response to this treatment. On October 08, 2018 he underwent autologous stem cell transplant at [facility] and tolerated the procedure well  The patient is currently on maintenance treatment with Revlimid 10 mg p.o. daily status post 21   months of treatment.  This was changed to Revlimid 5 mg p.o. daily secondary to neutropenia.   Patient has been tolerating his treatment well with no concerning adverse effects. Repeat myeloma panel performed recently showed no concerning findings for disease progression. I recommended for the patient to continue his current treatment with maintenance Revlimid with the same dose for now. For the bone disease he has been on treatment with Zometa every 3 months and he was informed by Dr. Leida Lauth and he recommended for him to discontinue his current treatment with Zometa for now. He will receive the last dose of Zometa today. I will see him back for follow-up visit in 3 months for evaluation with repeat myeloma panel. For the peripheral neuropathy, he will continue his current treatment with gabapentin. The patient was advised to call immediately if he has any other concerning symptoms in the interval. The patient voices understanding of current disease status and treatment options and is in agreement with the current care plan. All questions were answered. The patient knows to call the clinic with any problems, questions or concerns. We can certainly see the patient much sooner if necessary.  The total time spent in the appointment was 30 minutes.  Disclaimer: This note was dictated with voice recognition software. Similar sounding words can inadvertently be transcribed and may not be corrected upon review.

## 2022-01-14 NOTE — Patient Instructions (Signed)

## 2022-02-13 ENCOUNTER — Other Ambulatory Visit (HOSPITAL_COMMUNITY): Payer: Self-pay

## 2022-02-14 ENCOUNTER — Other Ambulatory Visit (HOSPITAL_COMMUNITY): Payer: Self-pay

## 2022-02-20 ENCOUNTER — Other Ambulatory Visit (HOSPITAL_COMMUNITY): Payer: Self-pay

## 2022-02-26 ENCOUNTER — Other Ambulatory Visit (HOSPITAL_COMMUNITY): Payer: Self-pay

## 2022-02-27 ENCOUNTER — Other Ambulatory Visit: Payer: Self-pay | Admitting: Medical Oncology

## 2022-02-27 DIAGNOSIS — C9000 Multiple myeloma not having achieved remission: Secondary | ICD-10-CM

## 2022-02-27 MED ORDER — LENALIDOMIDE 5 MG PO CAPS: 5.0000 mg | ORAL_CAPSULE | Freq: Every day | ORAL | 0 refills | Status: DC

## 2022-02-27 NOTE — Telephone Encounter (Signed)
[name] Encounter   Received notification re-enrollment for assistance for Revlimid through [employer] has been approved. Patient may continue to receive their medication at $0 from this program.    [employer] phone number [phone].   Effective dates: 02/03/22 through 02/03/23  I have spoken to the patient.  Matthew Yates, CPhT-Adv Oncology Pharmacy Patient [place] [facility] Direct Number: [phone]  Fax: [phone]

## 2022-03-25 ENCOUNTER — Other Ambulatory Visit: Payer: Self-pay | Admitting: Medical Oncology

## 2022-03-25 DIAGNOSIS — C9000 Multiple myeloma not having achieved remission: Secondary | ICD-10-CM

## 2022-03-25 MED ORDER — LENALIDOMIDE 5 MG PO CAPS: 5.0000 mg | ORAL_CAPSULE | Freq: Every day | ORAL | 0 refills | Status: DC

## 2022-04-07 ENCOUNTER — Telehealth: Payer: Self-pay | Admitting: Medical Oncology

## 2022-04-07 NOTE — Telephone Encounter (Signed)
I told pt his last Zometa was in December.

## 2022-04-09 ENCOUNTER — Other Ambulatory Visit: Payer: Self-pay

## 2022-04-09 ENCOUNTER — Inpatient Hospital Stay: Payer: Medicare Other | Attending: Internal Medicine

## 2022-04-09 DIAGNOSIS — C9000 Multiple myeloma not having achieved remission: Secondary | ICD-10-CM | POA: Insufficient documentation

## 2022-04-09 DIAGNOSIS — Z79899 Other long term (current) drug therapy: Secondary | ICD-10-CM | POA: Diagnosis not present

## 2022-04-09 DIAGNOSIS — T451X5A Adverse effect of antineoplastic and immunosuppressive drugs, initial encounter: Secondary | ICD-10-CM | POA: Diagnosis not present

## 2022-04-09 DIAGNOSIS — Z9484 Stem cells transplant status: Secondary | ICD-10-CM | POA: Diagnosis not present

## 2022-04-09 DIAGNOSIS — D701 Agranulocytosis secondary to cancer chemotherapy: Secondary | ICD-10-CM | POA: Diagnosis not present

## 2022-04-09 DIAGNOSIS — C9001 Multiple myeloma in remission: Secondary | ICD-10-CM

## 2022-04-09 DIAGNOSIS — G629 Polyneuropathy, unspecified: Secondary | ICD-10-CM | POA: Insufficient documentation

## 2022-04-09 LAB — CMP (CANCER CENTER ONLY)
ALT: 16 U/L (ref 0–44)
AST: 20 U/L (ref 15–41)
Albumin: 3.9 g/dL (ref 3.5–5.0)
Alkaline Phosphatase: 52 U/L (ref 38–126)
Anion gap: 7 (ref 5–15)
BUN: 12 mg/dL (ref 8–23)
CO2: 30 mmol/L (ref 22–32)
Calcium: 8.6 mg/dL — ABNORMAL LOW (ref 8.9–10.3)
Chloride: 103 mmol/L (ref 98–111)
Creatinine: 1.21 mg/dL (ref 0.61–1.24)
GFR, Estimated: >60 mL/min (ref >60)
Glucose, Bld: 87 mg/dL (ref 70–99)
Potassium: 3.3 mmol/L — ABNORMAL LOW (ref 3.5–5.1)
Sodium: 140 mmol/L (ref 135–145)
Total Bilirubin: 0.8 mg/dL (ref 0.3–1.2)
Total Protein: 7.2 g/dL (ref 6.5–8.1)

## 2022-04-09 LAB — CBC WITH DIFFERENTIAL (CANCER CENTER ONLY)
Abs Immature Granulocytes: 0.01 10*3/uL (ref 0.00–0.07)
Basophils Absolute: 0 10*3/uL (ref 0.0–0.1)
Basophils Relative: 1 %
Eosinophils Absolute: 0.4 10*3/uL (ref 0.0–0.5)
Eosinophils Relative: 8 %
HCT: 40.2 % (ref 39.0–52.0)
Hemoglobin: 14.1 g/dL (ref 13.0–17.0)
Immature Granulocytes: 0 %
Lymphocytes Relative: 40 %
Lymphs Abs: 2.1 10*3/uL (ref 0.7–4.0)
MCH: 34.3 pg — ABNORMAL HIGH (ref 26.0–34.0)
MCHC: 35.1 g/dL (ref 30.0–36.0)
MCV: 97.8 fL (ref 80.0–100.0)
Monocytes Absolute: 0.6 10*3/uL (ref 0.1–1.0)
Monocytes Relative: 11 %
Neutro Abs: 2.1 10*3/uL (ref 1.7–7.7)
Neutrophils Relative %: 40 %
Platelet Count: 180 10*3/uL (ref 150–400)
RBC: 4.11 MIL/uL — ABNORMAL LOW (ref 4.22–5.81)
RDW: 13.9 % (ref 11.5–15.5)
WBC Count: 5.2 10*3/uL (ref 4.0–10.5)
nRBC: 0 % (ref 0.0–0.2)

## 2022-04-09 LAB — LACTATE DEHYDROGENASE: LDH: 138 U/L (ref 98–192)

## 2022-04-10 LAB — IGG, IGA, IGM
IgA: 291 mg/dL (ref 61–437)
IgG (Immunoglobin G), Serum: 1392 mg/dL (ref 603–1613)
IgM (Immunoglobulin M), Srm: 22 mg/dL (ref 20–172)

## 2022-04-10 LAB — KAPPA/LAMBDA LIGHT CHAINS
Kappa free light chain: 58.2 mg/L — ABNORMAL HIGH (ref 3.3–19.4)
Kappa, lambda light chain ratio: 2.33 — ABNORMAL HIGH (ref 0.26–1.65)
Lambda free light chains: 25 mg/L (ref 5.7–26.3)

## 2022-04-11 LAB — BETA 2 MICROGLOBULIN, SERUM: Beta-2 Microglobulin: 1.7 mg/L (ref 0.6–2.4)

## 2022-04-16 ENCOUNTER — Other Ambulatory Visit: Payer: Self-pay | Admitting: Medical Oncology

## 2022-04-16 ENCOUNTER — Inpatient Hospital Stay (HOSPITAL_BASED_OUTPATIENT_CLINIC_OR_DEPARTMENT_OTHER): Payer: Medicare Other | Admitting: Internal Medicine

## 2022-04-16 ENCOUNTER — Other Ambulatory Visit: Payer: Self-pay

## 2022-04-16 VITALS — BP 150/79 | HR 76 | Temp 98.4°F | Resp 17 | Wt 239.4 lb

## 2022-04-16 DIAGNOSIS — C9000 Multiple myeloma not having achieved remission: Secondary | ICD-10-CM

## 2022-04-16 DIAGNOSIS — C9001 Multiple myeloma in remission: Secondary | ICD-10-CM | POA: Diagnosis not present

## 2022-04-16 MED ORDER — LENALIDOMIDE 5 MG PO CAPS: 5.0000 mg | ORAL_CAPSULE | Freq: Every day | ORAL | 0 refills | Status: DC

## 2022-04-16 NOTE — Progress Notes (Signed)
[place] [phone]   Fax:([phone]  OFFICE PROGRESS NOTE  Derinda Late, MD [phone] [address] [facility] [place] [address]  DIAGNOSIS: Multiple myeloma, IgG subtype diagnosed in January 2020.   PRIOR THERAPY:  1) Systemic chemotherapy with Velcade 1.3 mg/M2 on days 1, 8, 15 as well as Revlimid 25 mg p.o. daily for 21 days every 4 weeks as well as Decadron 40 mg weekly.  First dose expected March 22, 2018.  Status post 6 cycles. 2) autologous stem cell transplant at [facility] on 10/08/2018.   CURRENT THERAPY:   1) Maintenance Revlimid 10 mg p.o. daily.  This changed to Revlimid 5 mg p.o. daily because of the persistent drug-induced neutropenia. 2) Zometa infusion every 3 months.  INTERVAL HISTORY: Matthew Yates 72 y.o. male returns to the clinic today for follow-up visit.  The patient is feeling fine today with no concerning complaints except for occasional episodes of diarrhea and he is currently on cholestyramine on as-needed basis.  He denied having any current nausea, vomiting, abdominal pain or constipation.  He has no chest pain, shortness of breath, cough or hemoptysis.  He has no fever or chills.  He continues to tolerate his treatment with Revlimid fairly well.  The patient is here today for evaluation with repeat myeloma panel.  MEDICAL HISTORY: Past Medical History:  Diagnosis Date   Acute medial meniscus tear of left knee    Arthritis    lt knee   GERD (gastroesophageal reflux disease)    Hypertension     ALLERGIES:  is allergic to other.  MEDICATIONS:  Current Outpatient Medications  Medication Sig Dispense Refill   ALPRAZolam (XANAX) 0.25 MG tablet Take 1 tablet (0.25 mg total) by mouth at bedtime as needed for anxiety. 30 tablet 0   amLODipine (NORVASC) 5 MG tablet Take 5 mg by mouth daily.     fluticasone (FLONASE) 50 MCG/ACT nasal spray Place 2 sprays into both  nostrils daily.     HYDROcodone-acetaminophen (NORCO) 7.5-325 MG tablet Take 1-2 tablets by mouth every 6 (six) hours as needed for moderate pain. 90 tablet 0   ibuprofen (ADVIL,MOTRIN) 400 MG tablet Take 400 mg by mouth every 6 (six) hours as needed.     lenalidomide (REVLIMID) 5 MG capsule Take 1 capsule (5 mg total) by mouth daily for 28 days. Adult male. Auth obtained 03/25/2022 # [id number] 28 capsule 0   loratadine-pseudoephedrine (CLARITIN-D 12-HOUR) 5-120 MG tablet Take 1 tablet by mouth 2 (two) times daily.     LORazepam (ATIVAN) 0.5 MG tablet Take by mouth.     valACYclovir (VALTREX) 500 MG tablet Take 500 mg by mouth daily.     vitamin B-12 (CYANOCOBALAMIN) 1000 MCG tablet Take by mouth.     No current facility-administered medications for this visit.    SURGICAL HISTORY:  Past Surgical History:  Procedure Laterality Date   CHONDROPLASTY Left 07/09/2016   Procedure: CHONDROPLASTY with MICROFRACTURE;  Surgeon: Leandrew Koyanagi, MD;  Location: [place];  Service: Orthopedics;  Laterality: Left;   COLONOSCOPY     x2   HIP SURGERY     aspiration only, no anesthesia   KNEE ARTHROSCOPY WITH MEDIAL MENISECTOMY Left 07/09/2016   Procedure: LEFT KNEE ARTHROSCOPY WITH PARTIAL MEDIAL MENISCECTOMY;  Surgeon: Leandrew Koyanagi, MD;  Location: [place];  Service: Orthopedics;  Laterality: Left;    REVIEW OF SYSTEMS:  A comprehensive review of systems was negative except for: Gastrointestinal: positive for diarrhea   PHYSICAL EXAMINATION: General appearance: alert, cooperative, appears stated age, and no distress Head: Normocephalic, without obvious abnormality, atraumatic Neck: no adenopathy, no JVD, supple, symmetrical, trachea midline, and thyroid not enlarged, symmetric, no tenderness/mass/nodules Lymph nodes: Cervical, supraclavicular, and axillary nodes normal. Resp: clear to auscultation bilaterally Back: symmetric, no curvature. ROM normal. No CVA  tenderness. Cardio: regular rate and rhythm, S1, S2 normal, no murmur, click, rub or gallop GI: soft, non-tender; bowel sounds normal; no masses,  no organomegaly Extremities: extremities normal, atraumatic, no cyanosis or edema  ECOG PERFORMANCE STATUS: 1 - Symptomatic but completely ambulatory  Blood pressure (!) 150/79, pulse 76, temperature 98.4 F (36.9 C), temperature source Oral, resp. rate 17, weight 239 lb 7 oz (108.6 kg), SpO2 100 %.  LABORATORY DATA: Lab Results  Component Value Date   WBC 5.2 04/09/2022   HGB 14.1 04/09/2022   HCT 40.2 04/09/2022   MCV 97.8 04/09/2022   PLT 180 04/09/2022      Chemistry      Component Value Date/Time   NA 140 04/09/2022 1315   K 3.3 (L) 04/09/2022 1315   CL 103 04/09/2022 1315   CO2 30 04/09/2022 1315   BUN 12 04/09/2022 1315   CREATININE 1.21 04/09/2022 1315      Component Value Date/Time   CALCIUM 8.6 (L) 04/09/2022 1315   ALKPHOS 52 04/09/2022 1315   AST 20 04/09/2022 1315   ALT 16 04/09/2022 1315   BILITOT 0.8 04/09/2022 1315       RADIOGRAPHIC STUDIES: No results found.  ASSESSMENT AND PLAN: This is a very pleasant 72  years old African-American male recently diagnosed with multiple myeloma, IgG subtype.  He is currently undergoing treatment with Revlimid, Velcade and Decadron status post 6 cycles. The patient tolerated his treatment well with no concerning complaints except for mild peripheral neuropathy. The patient has very good response to this treatment. On October 08, 2018 he underwent autologous stem cell transplant at [facility] and tolerated the procedure well  The patient is currently on maintenance treatment with Revlimid 10 mg p.o. daily status post 21  months of treatment.  This was changed to Revlimid 5 mg p.o. daily secondary to neutropenia.   The patient continues to tolerate this treatment fairly well with no concerning adverse effects. He had repeat myeloma panel that showed no concerning  findings for progression. I recommended for him to continue his current treatment with Revlimid with the same dose. For the bone disease he has been on treatment with Zometa every 3 months and he was informed by Dr. Leida Lauth and he recommended for him to discontinue his current treatment with Zometa for now. For the peripheral neuropathy, he will continue his current treatment with gabapentin. I will see him back for follow-up visit in 6 months for evaluation and repeat myeloma panel. He was advised to call immediately if he has any concerning symptoms in the interval. The patient voices understanding of current disease status and treatment options and is in agreement with the current care plan. All questions were answered. The patient knows to call the clinic with any problems, questions or concerns. We can certainly see the patient much sooner if necessary.  The total time spent in the appointment was 20 minutes.  Disclaimer: This note was dictated with voice recognition software. Similar sounding words can inadvertently be transcribed and may not be corrected upon review.

## 2022-05-15 ENCOUNTER — Other Ambulatory Visit: Payer: Self-pay

## 2022-05-15 DIAGNOSIS — C9000 Multiple myeloma not having achieved remission: Secondary | ICD-10-CM

## 2022-05-15 MED ORDER — LENALIDOMIDE 5 MG PO CAPS: 5.0000 mg | ORAL_CAPSULE | Freq: Every day | ORAL | 0 refills | Status: DC

## 2022-06-23 ENCOUNTER — Other Ambulatory Visit: Payer: Self-pay | Admitting: Medical Oncology

## 2022-06-23 DIAGNOSIS — C9000 Multiple myeloma not having achieved remission: Secondary | ICD-10-CM

## 2022-06-23 MED ORDER — LENALIDOMIDE 5 MG PO CAPS
5.0000 mg | ORAL_CAPSULE | Freq: Every day | ORAL | 0 refills | Status: DC
Start: 2022-06-23 — End: 2022-07-21

## 2022-07-21 ENCOUNTER — Other Ambulatory Visit: Payer: Self-pay | Admitting: *Deleted

## 2022-07-21 DIAGNOSIS — C9000 Multiple myeloma not having achieved remission: Secondary | ICD-10-CM

## 2022-07-21 MED ORDER — LENALIDOMIDE 5 MG PO CAPS
5.0000 mg | ORAL_CAPSULE | Freq: Every day | ORAL | 0 refills | Status: DC
Start: 2022-07-21 — End: 2022-08-28

## 2022-08-28 ENCOUNTER — Other Ambulatory Visit: Payer: Self-pay | Admitting: Medical Oncology

## 2022-08-28 DIAGNOSIS — C9000 Multiple myeloma not having achieved remission: Secondary | ICD-10-CM

## 2022-08-28 MED ORDER — LENALIDOMIDE 5 MG PO CAPS
5.0000 mg | ORAL_CAPSULE | Freq: Every day | ORAL | 0 refills | Status: DC
Start: 2022-08-28 — End: 2022-09-22

## 2022-08-28 NOTE — Telephone Encounter (Signed)
Revlimid refill requested 

## 2022-09-22 ENCOUNTER — Other Ambulatory Visit: Payer: Self-pay | Admitting: Medical Oncology

## 2022-09-22 DIAGNOSIS — C9000 Multiple myeloma not having achieved remission: Secondary | ICD-10-CM

## 2022-09-22 MED ORDER — LENALIDOMIDE 5 MG PO CAPS
5.0000 mg | ORAL_CAPSULE | Freq: Every day | ORAL | 0 refills | Status: DC
Start: 2022-09-22 — End: 2022-10-16

## 2022-09-22 NOTE — Telephone Encounter (Signed)
Requested Revlimid refill -sent to [facility] to authorize.

## 2022-10-15 ENCOUNTER — Inpatient Hospital Stay: Payer: Medicare Other | Attending: Internal Medicine

## 2022-10-15 DIAGNOSIS — G629 Polyneuropathy, unspecified: Secondary | ICD-10-CM | POA: Insufficient documentation

## 2022-10-15 DIAGNOSIS — Z9484 Stem cells transplant status: Secondary | ICD-10-CM | POA: Insufficient documentation

## 2022-10-15 DIAGNOSIS — C9000 Multiple myeloma not having achieved remission: Secondary | ICD-10-CM | POA: Insufficient documentation

## 2022-10-15 DIAGNOSIS — C9001 Multiple myeloma in remission: Secondary | ICD-10-CM

## 2022-10-15 DIAGNOSIS — R0781 Pleurodynia: Secondary | ICD-10-CM | POA: Insufficient documentation

## 2022-10-15 DIAGNOSIS — Z79899 Other long term (current) drug therapy: Secondary | ICD-10-CM | POA: Insufficient documentation

## 2022-10-15 LAB — CMP (CANCER CENTER ONLY)
ALT: 26 U/L (ref 0–44)
AST: 21 U/L (ref 15–41)
Albumin: 4 g/dL (ref 3.5–5.0)
Alkaline Phosphatase: 61 U/L (ref 38–126)
Anion gap: 6 (ref 5–15)
BUN: 10 mg/dL (ref 8–23)
CO2: 29 mmol/L (ref 22–32)
Calcium: 8.8 mg/dL — ABNORMAL LOW (ref 8.9–10.3)
Chloride: 106 mmol/L (ref 98–111)
Creatinine: 1.34 mg/dL — ABNORMAL HIGH (ref 0.61–1.24)
GFR, Estimated: 57 mL/min — ABNORMAL LOW (ref >60)
Glucose, Bld: 97 mg/dL (ref 70–99)
Potassium: 3.3 mmol/L — ABNORMAL LOW (ref 3.5–5.1)
Sodium: 141 mmol/L (ref 135–145)
Total Bilirubin: 0.7 mg/dL (ref 0.3–1.2)
Total Protein: 7.4 g/dL (ref 6.5–8.1)

## 2022-10-15 LAB — CBC WITH DIFFERENTIAL (CANCER CENTER ONLY)
Abs Immature Granulocytes: 0 10*3/uL (ref 0.00–0.07)
Basophils Absolute: 0.1 10*3/uL (ref 0.0–0.1)
Basophils Relative: 2 %
Eosinophils Absolute: 0.3 10*3/uL (ref 0.0–0.5)
Eosinophils Relative: 7 %
HCT: 40.5 % (ref 39.0–52.0)
Hemoglobin: 14.6 g/dL (ref 13.0–17.0)
Immature Granulocytes: 0 %
Lymphocytes Relative: 39 %
Lymphs Abs: 2 10*3/uL (ref 0.7–4.0)
MCH: 35.5 pg — ABNORMAL HIGH (ref 26.0–34.0)
MCHC: 36 g/dL (ref 30.0–36.0)
MCV: 98.5 fL (ref 80.0–100.0)
Monocytes Absolute: 0.6 10*3/uL (ref 0.1–1.0)
Monocytes Relative: 13 %
Neutro Abs: 1.9 10*3/uL (ref 1.7–7.7)
Neutrophils Relative %: 39 %
Platelet Count: 197 10*3/uL (ref 150–400)
RBC: 4.11 MIL/uL — ABNORMAL LOW (ref 4.22–5.81)
RDW: 14.1 % (ref 11.5–15.5)
WBC Count: 5 10*3/uL (ref 4.0–10.5)
nRBC: 0 % (ref 0.0–0.2)

## 2022-10-15 LAB — LACTATE DEHYDROGENASE: LDH: 116 U/L (ref 98–192)

## 2022-10-16 ENCOUNTER — Other Ambulatory Visit: Payer: Self-pay | Admitting: Medical Oncology

## 2022-10-16 DIAGNOSIS — C9000 Multiple myeloma not having achieved remission: Secondary | ICD-10-CM

## 2022-10-16 LAB — IGG, IGA, IGM
IgA: 335 mg/dL (ref 61–437)
IgG (Immunoglobin G), Serum: 1383 mg/dL (ref 603–1613)
IgM (Immunoglobulin M), Srm: 20 mg/dL (ref 20–172)

## 2022-10-16 LAB — BETA 2 MICROGLOBULIN, SERUM: Beta-2 Microglobulin: 1.7 mg/L (ref 0.6–2.4)

## 2022-10-16 LAB — KAPPA/LAMBDA LIGHT CHAINS
Kappa free light chain: 60.1 mg/L — ABNORMAL HIGH (ref 3.3–19.4)
Kappa, lambda light chain ratio: 2.67 — ABNORMAL HIGH (ref 0.26–1.65)
Lambda free light chains: 22.5 mg/L (ref 5.7–26.3)

## 2022-10-16 MED ORDER — LENALIDOMIDE 5 MG PO CAPS: 5.0000 mg | ORAL_CAPSULE | Freq: Every day | ORAL | 0 refills | Status: DC

## 2022-10-20 ENCOUNTER — Inpatient Hospital Stay (HOSPITAL_BASED_OUTPATIENT_CLINIC_OR_DEPARTMENT_OTHER): Payer: Medicare Other | Admitting: Internal Medicine

## 2022-10-20 VITALS — BP 147/68 | HR 69 | Temp 98.1°F | Resp 18 | Ht 75.0 in | Wt 240.0 lb

## 2022-10-20 DIAGNOSIS — C9001 Multiple myeloma in remission: Secondary | ICD-10-CM | POA: Diagnosis not present

## 2022-10-20 DIAGNOSIS — C9000 Multiple myeloma not having achieved remission: Secondary | ICD-10-CM | POA: Diagnosis not present

## 2022-10-20 NOTE — Progress Notes (Signed)
[facility] Telephone:(336) [phone]   Fax:(336) [phone]  OFFICE PROGRESS NOTE  Kandyce Rud, MD [phone] [address] [facility] [place] [address]  DIAGNOSIS: Multiple myeloma, IgG subtype diagnosed in January 2020.   PRIOR THERAPY:  1) Systemic chemotherapy with Velcade 1.3 mg/M2 on days 1, 8, 15 as well as Revlimid 25 mg p.o. daily for 21 days every 4 weeks as well as Decadron 40 mg weekly.  First dose expected March 22, 2018.  Status post 6 cycles. 2) autologous stem cell transplant at [facility] on 10/08/2018. 3) Zometa infusion every 3 months.   CURRENT THERAPY:  Maintenance Revlimid 10 mg p.o. daily.  This changed to Revlimid 5 mg p.o. daily because of the persistent drug-induced neutropenia.   INTERVAL HISTORY: Matthew Yates 72 y.o. male returns to the clinic today for follow-up visit.  The patient is feeling fine today with no concerning complaints except for intermittent right lower rib cage pain started a week ago after he was working in his yard and garage.  It improves when he takes ibuprofen.  He denied having any current chest pain, shortness of breath, cough or hemoptysis.  He has no nausea, vomiting, diarrhea or constipation.  He has no headache or visual changes.  He is here today for evaluation with repeat myeloma panel.  MEDICAL HISTORY: Past Medical History:  Diagnosis Date   Acute medial meniscus tear of left knee    Arthritis    lt knee   GERD (gastroesophageal reflux disease)    Hypertension     ALLERGIES:  is allergic to other.  MEDICATIONS:  Current Outpatient Medications  Medication Sig Dispense Refill   ALPRAZolam (XANAX) 0.25 MG tablet Take 1 tablet (0.25 mg total) by mouth at bedtime as needed for anxiety. 30 tablet 0   amLODipine (NORVASC) 5 MG tablet Take 5 mg by mouth daily.     fluticasone (FLONASE) 50 MCG/ACT nasal spray Place 2 sprays into both nostrils daily.      HYDROcodone-acetaminophen (NORCO) 7.5-325 MG tablet Take 1-2 tablets by mouth every 6 (six) hours as needed for moderate pain. 90 tablet 0   ibuprofen (ADVIL,MOTRIN) 400 MG tablet Take 400 mg by mouth every 6 (six) hours as needed.     lenalidomide (REVLIMID) 5 MG capsule Take 1 capsule (5 mg total) by mouth daily for 28 days. Adult male. Auth obtained 10/16/22 [id number] This Authorization is valid for 30 days 28 capsule 0   loratadine-pseudoephedrine (CLARITIN-D 12-HOUR) 5-120 MG tablet Take 1 tablet by mouth 2 (two) times daily.     LORazepam (ATIVAN) 0.5 MG tablet Take by mouth.     valACYclovir (VALTREX) 500 MG tablet Take 500 mg by mouth daily.     vitamin B-12 (CYANOCOBALAMIN) 1000 MCG tablet Take by mouth.     No current facility-administered medications for this visit.    SURGICAL HISTORY:  Past Surgical History:  Procedure Laterality Date   CHONDROPLASTY Left 07/09/2016   Procedure: CHONDROPLASTY with MICROFRACTURE;  Surgeon: Tarry Kos, MD;  Location: [facility];  Service: Orthopedics;  Laterality: Left;   COLONOSCOPY     x2   HIP SURGERY     aspiration only, no anesthesia   KNEE ARTHROSCOPY WITH MEDIAL MENISECTOMY Left 07/09/2016   Procedure: LEFT KNEE ARTHROSCOPY WITH PARTIAL MEDIAL MENISCECTOMY;  Surgeon: Tarry Kos, MD;  Location: [facility];  Service: Orthopedics;  Laterality: Left;  REVIEW OF SYSTEMS:  A comprehensive review of systems was negative except for: Musculoskeletal: positive for back pain   PHYSICAL EXAMINATION: General appearance: alert, cooperative, appears stated age, and no distress Head: Normocephalic, without obvious abnormality, atraumatic Neck: no adenopathy, no JVD, supple, symmetrical, trachea midline, and thyroid not enlarged, symmetric, no tenderness/mass/nodules Lymph nodes: Cervical, supraclavicular, and axillary nodes normal. Resp: clear to auscultation bilaterally Back: symmetric, no curvature. ROM normal.  No CVA tenderness. Cardio: regular rate and rhythm, S1, S2 normal, no murmur, click, rub or gallop GI: soft, non-tender; bowel sounds normal; no masses,  no organomegaly Extremities: extremities normal, atraumatic, no cyanosis or edema  ECOG PERFORMANCE STATUS: 1 - Symptomatic but completely ambulatory  Blood pressure (!) 147/68, pulse 69, temperature 98.1 F (36.7 C), temperature source Oral, resp. rate 18, height 6\' 3"  (1.905 m), weight 240 lb (108.9 kg), SpO2 100%.  LABORATORY DATA: Lab Results  Component Value Date   WBC 5.0 10/15/2022   HGB 14.6 10/15/2022   HCT 40.5 10/15/2022   MCV 98.5 10/15/2022   PLT 197 10/15/2022      Chemistry      Component Value Date/Time   NA 141 10/15/2022 1231   K 3.3 (L) 10/15/2022 1231   CL 106 10/15/2022 1231   CO2 29 10/15/2022 1231   BUN 10 10/15/2022 1231   CREATININE 1.34 (H) 10/15/2022 1231      Component Value Date/Time   CALCIUM 8.8 (L) 10/15/2022 1231   ALKPHOS 61 10/15/2022 1231   AST 21 10/15/2022 1231   ALT 26 10/15/2022 1231   BILITOT 0.7 10/15/2022 1231       RADIOGRAPHIC STUDIES: No results found.  ASSESSMENT AND PLAN: This is a very pleasant 72  years old African-American male recently diagnosed with multiple myeloma, IgG subtype.  He is currently undergoing treatment with Revlimid, Velcade and Decadron status post 6 cycles. The patient tolerated his treatment well with no concerning complaints except for mild peripheral neuropathy. The patient has very good response to this treatment. On October 08, 2018 he underwent autologous stem cell transplant at [facility] and tolerated the procedure well  The patient is currently on maintenance treatment with Revlimid 10 mg p.o. daily status post 21  months of treatment.  This was changed to Revlimid 5 mg p.o. daily secondary to neutropenia.   The patient has been tolerating this treatment well with no concerning adverse effects. He had repeat myeloma panel performed  recently.  I discussed the lab result with the patient and there is no concerning findings for progression.  He continues to have stable but elevated free kappa light chain. I recommended for him to continue his current treatment with Revlimid with the same dose. Regarding the rib pain, I gave the patient the option of imaging studies for this area but he think it is a muscle spasm and he would like to watch it a little bit longer. I will see him back for follow-up visit in 6 months for evaluation with repeat myeloma panel. For the bone disease he has been on treatment with Zometa every 3 months and he was informed by Dr. Melrose Nakayama and he recommended for him to discontinue his current treatment with Zometa for now. For the peripheral neuropathy, he will continue his current treatment with gabapentin. He was advised to call immediately if he has any other concerning symptoms in the interval. The patient voices understanding of current disease status and treatment options and is in agreement with the current  care plan. All questions were answered. The patient knows to call the clinic with any problems, questions or concerns. We can certainly see the patient much sooner if necessary.  The total time spent in the appointment was 30 minutes.  Disclaimer: This note was dictated with voice recognition software. Similar sounding words can inadvertently be transcribed and may not be corrected upon review.

## 2022-10-22 ENCOUNTER — Ambulatory Visit: Payer: Medicare Other | Admitting: Internal Medicine

## 2022-11-06 ENCOUNTER — Telehealth: Payer: Self-pay | Admitting: Medical Oncology

## 2022-11-06 NOTE — Telephone Encounter (Signed)
Prescriber survey will be available Oct 8th to get authorization number.

## 2022-11-13 ENCOUNTER — Other Ambulatory Visit: Payer: Self-pay | Admitting: Medical Oncology

## 2022-11-13 DIAGNOSIS — C9000 Multiple myeloma not having achieved remission: Secondary | ICD-10-CM

## 2022-11-13 MED ORDER — LENALIDOMIDE 5 MG PO CAPS: 5.0000 mg | ORAL_CAPSULE | Freq: Every day | ORAL | 0 refills | Status: DC

## 2022-11-13 NOTE — Telephone Encounter (Signed)
Revlimid refill requested 

## 2022-11-17 ENCOUNTER — Telehealth: Payer: Self-pay | Admitting: Medical Oncology

## 2022-11-17 ENCOUNTER — Other Ambulatory Visit (HOSPITAL_COMMUNITY): Payer: Self-pay

## 2022-11-17 NOTE — Telephone Encounter (Signed)
Persistent back pain-He is still having intermittent pain that moves around his back.It is sharp when he lays down. He has to force his self to eat because of diarrhea. [facility] started him on Questran for same . Has night sweats 2 times a week.

## 2022-12-08 ENCOUNTER — Telehealth: Payer: Self-pay | Admitting: Pharmacy Technician

## 2022-12-08 ENCOUNTER — Other Ambulatory Visit: Payer: Self-pay | Admitting: Medical Oncology

## 2022-12-08 DIAGNOSIS — C9000 Multiple myeloma not having achieved remission: Secondary | ICD-10-CM

## 2022-12-08 MED ORDER — LENALIDOMIDE 5 MG PO CAPS: 5.0000 mg | ORAL_CAPSULE | Freq: Every day | ORAL | 0 refills | Status: DC

## 2022-12-08 NOTE — Telephone Encounter (Signed)
[name] Encounter   Received notification that patient is due for re-enrollment for assistance for Revlimid through [employer].   Re-enrollment process has been initiated and will be submitted upon completion of necessary documents.  Patient has requested their portion be mailed to them to complete signatures.   Confirmed address:   [address] [place] [address]   [employer] phone number [phone].   I will continue to follow until final determination.  Jinger Neighbors, CPhT-Adv Oncology Pharmacy Patient Advocate [facility] Direct Number: [phone]  Fax: [phone]

## 2022-12-16 NOTE — Telephone Encounter (Signed)
[name] Encounter  Patient left vm returning my call and confirmed they have sent the renewal form back to me via mail. I will continue to be on the lookout for it to arrive here at the clinic.  Jinger Neighbors, CPhT-Adv Oncology Pharmacy Patient Advocate [facility] Direct Number: [phone]  Fax: [phone]

## 2022-12-19 NOTE — Telephone Encounter (Signed)
[name] Encounter   Submitted application for assistance for Revlimid to [employer].   Application submitted via e-fax to [phone]   [employer] phone number [phone].   I will continue to check the status until final determination.   Jinger Neighbors, CPhT-Adv Oncology Pharmacy Patient Advocate [facility] Direct Number: [phone]  Fax: [phone]

## 2023-01-16 ENCOUNTER — Telehealth: Payer: Self-pay | Admitting: Medical Oncology

## 2023-01-16 ENCOUNTER — Other Ambulatory Visit: Payer: Self-pay | Admitting: Medical Oncology

## 2023-01-16 DIAGNOSIS — C9000 Multiple myeloma not having achieved remission: Secondary | ICD-10-CM

## 2023-01-16 MED ORDER — LENALIDOMIDE 5 MG PO CAPS: 5.0000 mg | ORAL_CAPSULE | Freq: Every day | ORAL | 0 refills | Status: DC

## 2023-01-16 NOTE — Progress Notes (Signed)
Revlimid refilled.

## 2023-01-16 NOTE — Telephone Encounter (Signed)
Med refill

## 2023-01-22 NOTE — Telephone Encounter (Signed)
[name] Encounter   Received notification that the application for assistance for Revlimid through [employer] has been approved.   [employer] phone number [phone].   Effective dates: 02/04/23 through 02/03/24  Medication will be filled at [facility]. ([facility])  I have spoken to the patient.  Jinger Neighbors, CPhT-Adv Oncology Pharmacy Patient Advocate [facility] Direct Number: [phone]  Fax: [phone]

## 2023-02-11 ENCOUNTER — Other Ambulatory Visit: Payer: Self-pay | Admitting: Medical Oncology

## 2023-02-11 DIAGNOSIS — C9000 Multiple myeloma not having achieved remission: Secondary | ICD-10-CM

## 2023-02-11 MED ORDER — LENALIDOMIDE 5 MG PO CAPS: 5.0000 mg | ORAL_CAPSULE | Freq: Every day | ORAL | 0 refills | Status: DC

## 2023-03-25 ENCOUNTER — Other Ambulatory Visit: Payer: Self-pay | Admitting: *Deleted

## 2023-03-25 ENCOUNTER — Other Ambulatory Visit: Payer: Self-pay | Admitting: Internal Medicine

## 2023-03-25 ENCOUNTER — Telehealth: Payer: Self-pay | Admitting: *Deleted

## 2023-03-25 DIAGNOSIS — C9000 Multiple myeloma not having achieved remission: Secondary | ICD-10-CM

## 2023-03-25 MED ORDER — LENALIDOMIDE 5 MG PO CAPS
5.0000 mg | ORAL_CAPSULE | Freq: Every day | ORAL | 0 refills | Status: DC
Start: 2023-03-25 — End: 2023-04-20

## 2023-03-25 MED ORDER — METHYLPREDNISOLONE 4 MG PO TBPK: ORAL_TABLET | ORAL | 0 refills | Status: DC

## 2023-03-25 NOTE — Telephone Encounter (Signed)
 Patient called to request refill of Revlimid. While on phone stated his appetite is not great and asked if Dr Arbutus Ped would prescribe him something to stimulate his appetite. Advised him message will be sent to Dr. Arbutus Ped. He verbalized understanding.

## 2023-03-26 ENCOUNTER — Telehealth: Payer: Self-pay | Admitting: Medical Oncology

## 2023-03-26 NOTE — Telephone Encounter (Signed)
 I LVM that the Medrol dose pak prescription went to -[facility]. I asked pt to call me back to see if he wants me to resend it to a local pharmacy.

## 2023-04-20 ENCOUNTER — Other Ambulatory Visit: Payer: Self-pay

## 2023-04-20 DIAGNOSIS — C9000 Multiple myeloma not having achieved remission: Secondary | ICD-10-CM

## 2023-04-20 MED ORDER — LENALIDOMIDE 5 MG PO CAPS: 5.0000 mg | ORAL_CAPSULE | Freq: Every day | ORAL | 0 refills | Status: DC

## 2023-04-21 ENCOUNTER — Inpatient Hospital Stay: Payer: Medicare Other | Attending: Internal Medicine

## 2023-04-21 DIAGNOSIS — C9000 Multiple myeloma not having achieved remission: Secondary | ICD-10-CM | POA: Diagnosis present

## 2023-04-21 DIAGNOSIS — Z79899 Other long term (current) drug therapy: Secondary | ICD-10-CM | POA: Diagnosis not present

## 2023-04-21 DIAGNOSIS — Z7983 Long term (current) use of bisphosphonates: Secondary | ICD-10-CM | POA: Insufficient documentation

## 2023-04-21 DIAGNOSIS — Z9484 Stem cells transplant status: Secondary | ICD-10-CM | POA: Insufficient documentation

## 2023-04-21 DIAGNOSIS — E876 Hypokalemia: Secondary | ICD-10-CM | POA: Diagnosis not present

## 2023-04-21 DIAGNOSIS — C9001 Multiple myeloma in remission: Secondary | ICD-10-CM

## 2023-04-21 LAB — CBC WITH DIFFERENTIAL (CANCER CENTER ONLY)
Abs Immature Granulocytes: 0.01 10*3/uL (ref 0.00–0.07)
Basophils Absolute: 0.1 10*3/uL (ref 0.0–0.1)
Basophils Relative: 1 %
Eosinophils Absolute: 0.5 10*3/uL (ref 0.0–0.5)
Eosinophils Relative: 10 %
HCT: 41.1 % (ref 39.0–52.0)
Hemoglobin: 14.2 g/dL (ref 13.0–17.0)
Immature Granulocytes: 0 %
Lymphocytes Relative: 46 %
Lymphs Abs: 2.1 10*3/uL (ref 0.7–4.0)
MCH: 33.3 pg (ref 26.0–34.0)
MCHC: 34.5 g/dL (ref 30.0–36.0)
MCV: 96.5 fL (ref 80.0–100.0)
Monocytes Absolute: 0.7 10*3/uL (ref 0.1–1.0)
Monocytes Relative: 15 %
Neutro Abs: 1.3 10*3/uL — ABNORMAL LOW (ref 1.7–7.7)
Neutrophils Relative %: 28 %
Platelet Count: 167 10*3/uL (ref 150–400)
RBC: 4.26 MIL/uL (ref 4.22–5.81)
RDW: 14 % (ref 11.5–15.5)
WBC Count: 4.6 10*3/uL (ref 4.0–10.5)
nRBC: 0 % (ref 0.0–0.2)

## 2023-04-21 LAB — CMP (CANCER CENTER ONLY)
ALT: 17 U/L (ref 0–44)
AST: 17 U/L (ref 15–41)
Albumin: 4 g/dL (ref 3.5–5.0)
Alkaline Phosphatase: 66 U/L (ref 38–126)
Anion gap: 5 (ref 5–15)
BUN: 10 mg/dL (ref 8–23)
CO2: 32 mmol/L (ref 22–32)
Calcium: 9.1 mg/dL (ref 8.9–10.3)
Chloride: 105 mmol/L (ref 98–111)
Creatinine: 1.16 mg/dL (ref 0.61–1.24)
GFR, Estimated: >60 mL/min (ref >60)
Glucose, Bld: 96 mg/dL (ref 70–99)
Potassium: 3.3 mmol/L — ABNORMAL LOW (ref 3.5–5.1)
Sodium: 142 mmol/L (ref 135–145)
Total Bilirubin: 0.8 mg/dL (ref 0.0–1.2)
Total Protein: 7.2 g/dL (ref 6.5–8.1)

## 2023-04-21 LAB — LACTATE DEHYDROGENASE: LDH: 108 U/L (ref 98–192)

## 2023-04-22 LAB — KAPPA/LAMBDA LIGHT CHAINS
Kappa free light chain: 62.8 mg/L — ABNORMAL HIGH (ref 3.3–19.4)
Kappa, lambda light chain ratio: 2.82 — ABNORMAL HIGH (ref 0.26–1.65)
Lambda free light chains: 22.3 mg/L (ref 5.7–26.3)

## 2023-04-22 LAB — IGG, IGA, IGM
IgA: 292 mg/dL (ref 61–437)
IgG (Immunoglobin G), Serum: 1427 mg/dL (ref 603–1613)
IgM (Immunoglobulin M), Srm: 28 mg/dL (ref 20–172)

## 2023-04-22 LAB — BETA 2 MICROGLOBULIN, SERUM: Beta-2 Microglobulin: 1.7 mg/L (ref 0.6–2.4)

## 2023-04-28 ENCOUNTER — Inpatient Hospital Stay (HOSPITAL_BASED_OUTPATIENT_CLINIC_OR_DEPARTMENT_OTHER): Payer: Medicare Other | Admitting: Internal Medicine

## 2023-04-28 VITALS — BP 146/92 | HR 74 | Temp 98.1°F | Resp 17 | Wt 236.9 lb

## 2023-04-28 DIAGNOSIS — C9001 Multiple myeloma in remission: Secondary | ICD-10-CM | POA: Diagnosis not present

## 2023-04-28 DIAGNOSIS — C9000 Multiple myeloma not having achieved remission: Secondary | ICD-10-CM | POA: Diagnosis not present

## 2023-04-28 NOTE — Progress Notes (Signed)
 [facility] Telephone:([phone]   Fax:([phone]  OFFICE PROGRESS NOTE  Kandyce Rud, MD [phone] [facility] - Family And Internal Medicine [place] [address]  DIAGNOSIS: Multiple myeloma, IgG subtype diagnosed in January 2020.   PRIOR THERAPY:  1) Systemic chemotherapy with Velcade 1.3 mg/M2 on days 1, 8, 15 as well as Revlimid 25 mg p.o. daily for 21 days every 4 weeks as well as Decadron 40 mg weekly.  First dose expected March 22, 2018.  Status post 6 cycles. 2) autologous stem cell transplant at [facility] on 10/08/2018. 3) Zometa infusion every 3 months.   CURRENT THERAPY:  Maintenance Revlimid 10 mg p.o. daily.  This changed to Revlimid 5 mg p.o. daily because of the persistent drug-induced neutropenia.   INTERVAL HISTORY: Matthew Yates 73 y.o. male returns to the clinic today for 50-month follow-up visit.Discussed the use of AI scribe software for clinical note transcription with the patient, who gave verbal consent to proceed.  History of Present Illness   Matthew Yates is a 74 year old male with multiple myeloma who presents for a follow-up visit.  He is currently on Revlimid, taking 5 mg orally once daily. He previously received Zometa infusions every three months for bone disease but stopped a year ago.  He experiences fluctuations in his appetite, describing it as 'goes and comes.' Sometimes he can go all day without eating due to lack of hunger, although he does not experience early satiety and can still taste and eat food regularly. He finds it challenging to maintain a consistent eating schedule. He has lost approximately 10 pounds since November, with his current weight being 236.9 pounds, down from 240 pounds in September. He attributes this weight change to his fluctuating appetite. No early satiety is reported.        MEDICAL HISTORY: Past Medical History:  Diagnosis Date   Acute medial meniscus tear of  left knee    Arthritis    lt knee   GERD (gastroesophageal reflux disease)    Hypertension     ALLERGIES:  is allergic to other.  MEDICATIONS:  Current Outpatient Medications  Medication Sig Dispense Refill   ALPRAZolam (XANAX) 0.25 MG tablet Take 1 tablet (0.25 mg total) by mouth at bedtime as needed for anxiety. 30 tablet 0   amLODipine (NORVASC) 5 MG tablet Take 5 mg by mouth daily.     cholestyramine (QUESTRAN) 4 g packet Take 4 g by mouth 2 (two) times daily.     fluticasone (FLONASE) 50 MCG/ACT nasal spray Place 2 sprays into both nostrils daily.     HYDROcodone-acetaminophen (NORCO) 7.5-325 MG tablet Take 1-2 tablets by mouth every 6 (six) hours as needed for moderate pain. 90 tablet 0   ibuprofen (ADVIL,MOTRIN) 400 MG tablet Take 400 mg by mouth every 6 (six) hours as needed.     lenalidomide (REVLIMID) 5 MG capsule Take 1 capsule (5 mg total) by mouth daily for 28 days. Auth# [id number] Date obtained 04/20/23 28 capsule 0   loratadine-pseudoephedrine (CLARITIN-D 12-HOUR) 5-120 MG tablet Take 1 tablet by mouth 2 (two) times daily.     LORazepam (ATIVAN) 0.5 MG tablet Take by mouth.     methylPREDNISolone (MEDROL DOSEPAK) 4 MG TBPK tablet Use as instructed 21 tablet 0   valACYclovir (VALTREX) 500 MG tablet Take 500 mg by mouth daily.     vitamin B-12 (CYANOCOBALAMIN) 1000 MCG tablet Take by mouth.  No current facility-administered medications for this visit.    SURGICAL HISTORY:  Past Surgical History:  Procedure Laterality Date   CHONDROPLASTY Left 07/09/2016   Procedure: CHONDROPLASTY with MICROFRACTURE;  Surgeon: Tarry Kos, MD;  Location: [facility];  Service: Orthopedics;  Laterality: Left;   COLONOSCOPY     x2   HIP SURGERY     aspiration only, no anesthesia   KNEE ARTHROSCOPY WITH MEDIAL MENISECTOMY Left 07/09/2016   Procedure: LEFT KNEE ARTHROSCOPY WITH PARTIAL MEDIAL MENISCECTOMY;  Surgeon: Tarry Kos, MD;  Location: [facility];   Service: Orthopedics;  Laterality: Left;    REVIEW OF SYSTEMS:  A comprehensive review of systems was negative.   PHYSICAL EXAMINATION: General appearance: alert, cooperative, appears stated age, and no distress Head: Normocephalic, without obvious abnormality, atraumatic Neck: no adenopathy, no JVD, supple, symmetrical, trachea midline, and thyroid not enlarged, symmetric, no tenderness/mass/nodules Lymph nodes: Cervical, supraclavicular, and axillary nodes normal. Resp: clear to auscultation bilaterally Back: symmetric, no curvature. ROM normal. No CVA tenderness. Cardio: regular rate and rhythm, S1, S2 normal, no murmur, click, rub or gallop GI: soft, non-tender; bowel sounds normal; no masses,  no organomegaly Extremities: extremities normal, atraumatic, no cyanosis or edema  ECOG PERFORMANCE STATUS: 1 - Symptomatic but completely ambulatory  Blood pressure (!) 146/92, pulse 74, temperature 98.1 F (36.7 C), temperature source Temporal, resp. rate 17, weight 236 lb 14.4 oz (107.5 kg), SpO2 99%.  LABORATORY DATA: Lab Results  Component Value Date   WBC 4.6 04/21/2023   HGB 14.2 04/21/2023   HCT 41.1 04/21/2023   MCV 96.5 04/21/2023   PLT 167 04/21/2023      Chemistry      Component Value Date/Time   NA 142 04/21/2023 0956   K 3.3 (L) 04/21/2023 0956   CL 105 04/21/2023 0956   CO2 32 04/21/2023 0956   BUN 10 04/21/2023 0956   CREATININE 1.16 04/21/2023 0956      Component Value Date/Time   CALCIUM 9.1 04/21/2023 0956   ALKPHOS 66 04/21/2023 0956   AST 17 04/21/2023 0956   ALT 17 04/21/2023 0956   BILITOT 0.8 04/21/2023 0956       RADIOGRAPHIC STUDIES: No results found.  ASSESSMENT AND PLAN: This is a very pleasant 73  years old African-American male recently diagnosed with multiple myeloma, IgG subtype.  He is currently undergoing treatment with Revlimid, Velcade and Decadron status post 6 cycles. The patient tolerated his treatment well with no concerning  complaints except for mild peripheral neuropathy. The patient has very good response to this treatment. On October 08, 2018 he underwent autologous stem cell transplant at [facility] and tolerated the procedure well  The patient is currently on maintenance treatment with Revlimid 10 mg p.o. daily status post 21  months of treatment.  This was changed to Revlimid 5 mg p.o. daily secondary to neutropenia.   The patient has been tolerating this treatment well with no concerning adverse effects. The patient had repeat myeloma panel performed recently.  I discussed the lab result with the patient and there is no concerning findings for disease progression.    Multiple Myeloma Multiple myeloma with well-managed recent myeloma panel results. Slight increase in free kappa light chain, not significantly different from previous results. IgG, IgA, and IgM levels are within normal limits. Currently on Revlimid 5 mg daily. Reports intermittent appetite loss but no significant weight loss since September, which is not concerning at this time. - Continue  Revlimid 5 mg daily - Repeat myeloma panel in six months  Bone Disease Previously on Zometa for bone disease, discontinued a year ago. No current treatment for bone disease.  Hypokalemia Recent labs show a slightly low potassium level. Advised dietary modifications to address this. - Advise increased intake of potassium-rich foods such as bananas and orange juice   For the bone disease he has been on treatment with Zometa every 3 months and he was informed by Dr. Melrose Nakayama and he recommended for him to discontinue his current treatment with Zometa for now. For the peripheral neuropathy, he will continue his current treatment with gabapentin. He was advised to call immediately if he has any concerning symptoms in the interval. The patient voices understanding of current disease status and treatment options and is in agreement with the current care  plan. All questions were answered. The patient knows to call the clinic with any problems, questions or concerns. We can certainly see the patient much sooner if necessary.  The total time spent in the appointment was 30 minutes.  Disclaimer: This note was dictated with voice recognition software. Similar sounding words can inadvertently be transcribed and may not be corrected upon review.

## 2023-05-15 ENCOUNTER — Other Ambulatory Visit: Payer: Self-pay | Admitting: Medical Oncology

## 2023-05-15 ENCOUNTER — Telehealth (HOSPITAL_COMMUNITY): Payer: Self-pay

## 2023-05-15 DIAGNOSIS — C9000 Multiple myeloma not having achieved remission: Secondary | ICD-10-CM

## 2023-05-15 MED ORDER — LENALIDOMIDE 5 MG PO CAPS: 5.0000 mg | ORAL_CAPSULE | Freq: Every day | ORAL | 0 refills | Status: DC

## 2023-05-15 NOTE — Telephone Encounter (Signed)
 Received Refill Request from [facility] for patient's Revlimid. Request has been sent to MD Office to send in new script for patient.    Ardeen Fillers, CPhT Pharmacy Technician Coordinator [facility] [phone] (Ph) 05/15/2023 8:18 AM

## 2023-06-04 ENCOUNTER — Other Ambulatory Visit: Payer: Self-pay | Admitting: Medical Oncology

## 2023-06-04 DIAGNOSIS — C9000 Multiple myeloma not having achieved remission: Secondary | ICD-10-CM

## 2023-06-05 ENCOUNTER — Other Ambulatory Visit: Payer: Self-pay | Admitting: Medical Oncology

## 2023-06-05 NOTE — Progress Notes (Signed)
 Revlimid  refill -pt is not listed in [facility] portal to get a new auth

## 2023-06-08 MED ORDER — LENALIDOMIDE 5 MG PO CAPS
5.0000 mg | ORAL_CAPSULE | Freq: Every day | ORAL | 0 refills | Status: DC
Start: 2023-06-08 — End: 2023-07-07

## 2023-07-07 ENCOUNTER — Other Ambulatory Visit: Payer: Self-pay | Admitting: Medical Oncology

## 2023-07-07 DIAGNOSIS — C9000 Multiple myeloma not having achieved remission: Secondary | ICD-10-CM

## 2023-07-07 MED ORDER — LENALIDOMIDE 5 MG PO CAPS: 5.0000 mg | ORAL_CAPSULE | Freq: Every day | ORAL | 0 refills | Status: DC

## 2023-08-05 ENCOUNTER — Other Ambulatory Visit: Payer: Self-pay

## 2023-08-05 DIAGNOSIS — C9000 Multiple myeloma not having achieved remission: Secondary | ICD-10-CM

## 2023-08-05 MED ORDER — LENALIDOMIDE 5 MG PO CAPS: 5.0000 mg | ORAL_CAPSULE | Freq: Every day | ORAL | 0 refills | Status: DC

## 2023-09-04 ENCOUNTER — Other Ambulatory Visit: Payer: Self-pay | Admitting: Medical Oncology

## 2023-09-04 DIAGNOSIS — C9000 Multiple myeloma not having achieved remission: Secondary | ICD-10-CM

## 2023-09-04 MED ORDER — LENALIDOMIDE 5 MG PO CAPS: 5.0000 mg | ORAL_CAPSULE | Freq: Every day | ORAL | 0 refills | Status: DC

## 2023-10-06 ENCOUNTER — Other Ambulatory Visit: Payer: Self-pay | Admitting: Medical Oncology

## 2023-10-06 DIAGNOSIS — C9000 Multiple myeloma not having achieved remission: Secondary | ICD-10-CM

## 2023-10-06 MED ORDER — LENALIDOMIDE 5 MG PO CAPS: 5.0000 mg | ORAL_CAPSULE | Freq: Every day | ORAL | 0 refills | Status: DC

## 2023-10-22 ENCOUNTER — Inpatient Hospital Stay: Attending: Internal Medicine

## 2023-10-22 DIAGNOSIS — C9000 Multiple myeloma not having achieved remission: Secondary | ICD-10-CM | POA: Insufficient documentation

## 2023-10-22 DIAGNOSIS — C9001 Multiple myeloma in remission: Secondary | ICD-10-CM

## 2023-10-22 DIAGNOSIS — Z9484 Stem cells transplant status: Secondary | ICD-10-CM | POA: Insufficient documentation

## 2023-10-22 DIAGNOSIS — D708 Other neutropenia: Secondary | ICD-10-CM | POA: Insufficient documentation

## 2023-10-22 DIAGNOSIS — R197 Diarrhea, unspecified: Secondary | ICD-10-CM | POA: Insufficient documentation

## 2023-10-22 LAB — CMP (CANCER CENTER ONLY)
ALT: 26 U/L (ref 0–44)
AST: 21 U/L (ref 15–41)
Albumin: 4.1 g/dL (ref 3.5–5.0)
Alkaline Phosphatase: 65 U/L (ref 38–126)
Anion gap: 6 (ref 5–15)
BUN: 13 mg/dL (ref 8–23)
CO2: 32 mmol/L (ref 22–32)
Calcium: 9.2 mg/dL (ref 8.9–10.3)
Chloride: 105 mmol/L (ref 98–111)
Creatinine: 1.37 mg/dL — ABNORMAL HIGH (ref 0.61–1.24)
GFR, Estimated: 55 mL/min — ABNORMAL LOW (ref >60)
Glucose, Bld: 99 mg/dL (ref 70–99)
Potassium: 3.7 mmol/L (ref 3.5–5.1)
Sodium: 143 mmol/L (ref 135–145)
Total Bilirubin: 0.5 mg/dL (ref 0.0–1.2)
Total Protein: 7.6 g/dL (ref 6.5–8.1)

## 2023-10-22 LAB — CBC WITH DIFFERENTIAL (CANCER CENTER ONLY)
Abs Immature Granulocytes: 0.01 K/uL (ref 0.00–0.07)
Basophils Absolute: 0.1 K/uL (ref 0.0–0.1)
Basophils Relative: 2 %
Eosinophils Absolute: 0.5 K/uL (ref 0.0–0.5)
Eosinophils Relative: 9 %
HCT: 41.8 % (ref 39.0–52.0)
Hemoglobin: 14.5 g/dL (ref 13.0–17.0)
Immature Granulocytes: 0 %
Lymphocytes Relative: 42 %
Lymphs Abs: 2.6 K/uL (ref 0.7–4.0)
MCH: 34 pg (ref 26.0–34.0)
MCHC: 34.7 g/dL (ref 30.0–36.0)
MCV: 97.9 fL (ref 80.0–100.0)
Monocytes Absolute: 0.8 K/uL (ref 0.1–1.0)
Monocytes Relative: 14 %
Neutro Abs: 1.9 K/uL (ref 1.7–7.7)
Neutrophils Relative %: 33 %
Platelet Count: 182 K/uL (ref 150–400)
RBC: 4.27 MIL/uL (ref 4.22–5.81)
RDW: 14.1 % (ref 11.5–15.5)
Smear Review: NORMAL
WBC Count: 5.9 K/uL (ref 4.0–10.5)
nRBC: 0 % (ref 0.0–0.2)

## 2023-10-22 LAB — LACTATE DEHYDROGENASE: LDH: 104 U/L (ref 98–192)

## 2023-10-23 LAB — KAPPA/LAMBDA LIGHT CHAINS
Kappa free light chain: 64.7 mg/L — ABNORMAL HIGH (ref 3.3–19.4)
Kappa, lambda light chain ratio: 2.42 — ABNORMAL HIGH (ref 0.26–1.65)
Lambda free light chains: 26.7 mg/L — ABNORMAL HIGH (ref 5.7–26.3)

## 2023-10-23 LAB — IGG, IGA, IGM
IgA: 313 mg/dL (ref 61–437)
IgG (Immunoglobin G), Serum: 1573 mg/dL (ref 603–1613)
IgM (Immunoglobulin M), Srm: 34 mg/dL (ref 20–172)

## 2023-10-24 LAB — BETA 2 MICROGLOBULIN, SERUM: Beta-2 Microglobulin: 2 mg/L (ref 0.6–2.4)

## 2023-10-29 ENCOUNTER — Telehealth: Payer: Self-pay | Admitting: Internal Medicine

## 2023-10-29 ENCOUNTER — Inpatient Hospital Stay (HOSPITAL_BASED_OUTPATIENT_CLINIC_OR_DEPARTMENT_OTHER): Admitting: Internal Medicine

## 2023-10-29 VITALS — BP 148/72 | HR 74 | Temp 97.9°F | Resp 16 | Ht 75.0 in | Wt 244.0 lb

## 2023-10-29 DIAGNOSIS — C9000 Multiple myeloma not having achieved remission: Secondary | ICD-10-CM | POA: Diagnosis not present

## 2023-10-29 DIAGNOSIS — C9001 Multiple myeloma in remission: Secondary | ICD-10-CM | POA: Diagnosis not present

## 2023-10-29 NOTE — Progress Notes (Signed)
 [facility] [phone]   Fax:(336) [phone]  OFFICE PROGRESS NOTE  Diedra Lame, MD [phone] [address] [facility] - Family And Internal Medicine [place] [address]  DIAGNOSIS: Multiple myeloma, IgG subtype diagnosed in January 2020.   PRIOR THERAPY:  1) Systemic chemotherapy with Velcade  1.3 mg/M2 on days 1, 8, 15 as well as Revlimid  25 mg p.o. daily for 21 days every 4 weeks as well as Decadron  40 mg weekly.  First dose expected March 22, 2018.  Status post 6 cycles. 2) autologous stem cell transplant at [facility] on 10/08/2018. 3) Zometa  infusion every 3 months.  This was discontinued.   CURRENT THERAPY:  Maintenance Revlimid  10 mg p.o. daily.  This changed to Revlimid  5 mg p.o. daily because of the persistent drug-induced neutropenia.   INTERVAL HISTORY: Matthew Yates 73 y.o. male returns to the clinic today for 51-month follow-up visit.Discussed the use of AI scribe software for clinical note transcription with the patient, who gave verbal consent to proceed.  History of Present Illness Matthew Yates is a 73 year old male with multiple myeloma who presents for evaluation and repeat myeloma panel.  He has a history of multiple myeloma, IgG subtype, diagnosed in January 2020. He underwent systemic chemotherapy with Velcade , Revlimid , and Decadron , followed by an autologous stem cell transplant at [facility] on October 08, 2018. He is currently on maintenance treatment with Revlimid  5 mg PO daily and receives Zemaira infusions every three months.  Over the last six months, the patient reports that nothing has changed. He experiences diarrhea approximately once a week, which he manages with a powder medication that he mixes with liquids. He needs to be cautious with his diet, especially when traveling, to manage this symptom effectively.  He is no longer receiving Zometa  injections. He continues to take Revlimid  5 mg daily as  part of his maintenance therapy.     MEDICAL HISTORY: Past Medical History:  Diagnosis Date   Acute medial meniscus tear of left knee    Arthritis    lt knee   GERD (gastroesophageal reflux disease)    Hypertension     ALLERGIES:  is allergic to other.  MEDICATIONS:  Current Outpatient Medications  Medication Sig Dispense Refill   ALPRAZolam  (XANAX ) 0.25 MG tablet Take 1 tablet (0.25 mg total) by mouth at bedtime as needed for anxiety. 30 tablet 0   amLODipine  (NORVASC ) 5 MG tablet Take 5 mg by mouth daily.     cholestyramine (QUESTRAN) 4 g packet Take 4 g by mouth 2 (two) times daily.     fluticasone  (FLONASE ) 50 MCG/ACT nasal spray Place 2 sprays into both nostrils daily.     HYDROcodone -acetaminophen  (NORCO) 7.5-325 MG tablet Take 1-2 tablets by mouth every 6 (six) hours as needed for moderate pain. 90 tablet 0   ibuprofen (ADVIL,MOTRIN) 400 MG tablet Take 400 mg by mouth every 6 (six) hours as needed.     lenalidomide  (REVLIMID ) 5 MG capsule Take 1 capsule (5 mg total) by mouth daily for 28 days. Take 1 capsule ( 5  mg total) by mouth daily for 28 days. Jluy#[id number] Date obtained 10/06/2023 Adult male. 28 capsule 0   loratadine -pseudoephedrine (CLARITIN -D 12-HOUR) 5-120 MG tablet Take 1 tablet by mouth 2 (two) times daily.     LORazepam (ATIVAN) 0.5 MG tablet Take by mouth.     valACYclovir  (VALTREX ) 500 MG tablet Take 500 mg by mouth daily.  vitamin B-12 (CYANOCOBALAMIN) 1000 MCG tablet Take by mouth.     No current facility-administered medications for this visit.    SURGICAL HISTORY:  Past Surgical History:  Procedure Laterality Date   CHONDROPLASTY Left 07/09/2016   Procedure: CHONDROPLASTY with MICROFRACTURE;  Surgeon: Jerri Kay HERO, MD;  Location: [facility];  Service: Orthopedics;  Laterality: Left;   COLONOSCOPY     x2   HIP SURGERY     aspiration only, no anesthesia   KNEE ARTHROSCOPY WITH MEDIAL MENISECTOMY Left 07/09/2016   Procedure: LEFT  KNEE ARTHROSCOPY WITH PARTIAL MEDIAL MENISCECTOMY;  Surgeon: Jerri Kay HERO, MD;  Location: [facility];  Service: Orthopedics;  Laterality: Left;    REVIEW OF SYSTEMS:  A comprehensive review of systems was negative except for: Gastrointestinal: positive for diarrhea   PHYSICAL EXAMINATION: General appearance: alert, cooperative, appears stated age, and no distress Head: Normocephalic, without obvious abnormality, atraumatic Neck: no adenopathy, no JVD, supple, symmetrical, trachea midline, and thyroid not enlarged, symmetric, no tenderness/mass/nodules Lymph nodes: Cervical, supraclavicular, and axillary nodes normal. Resp: clear to auscultation bilaterally Back: symmetric, no curvature. ROM normal. No CVA tenderness. Cardio: regular rate and rhythm, S1, S2 normal, no murmur, click, rub or gallop GI: soft, non-tender; bowel sounds normal; no masses,  no organomegaly Extremities: extremities normal, atraumatic, no cyanosis or edema  ECOG PERFORMANCE STATUS: 1 - Symptomatic but completely ambulatory  Blood pressure (!) 148/72, pulse 74, temperature 97.9 F (36.6 C), temperature source Temporal, resp. rate 16, height 6' 3 (1.905 m), weight 244 lb (110.7 kg), SpO2 99%.  LABORATORY DATA: Lab Results  Component Value Date   WBC 5.9 10/22/2023   HGB 14.5 10/22/2023   HCT 41.8 10/22/2023   MCV 97.9 10/22/2023   PLT 182 10/22/2023      Chemistry      Component Value Date/Time   NA 143 10/22/2023 0854   K 3.7 10/22/2023 0854   CL 105 10/22/2023 0854   CO2 32 10/22/2023 0854   BUN 13 10/22/2023 0854   CREATININE 1.37 (H) 10/22/2023 0854      Component Value Date/Time   CALCIUM 9.2 10/22/2023 0854   ALKPHOS 65 10/22/2023 0854   AST 21 10/22/2023 0854   ALT 26 10/22/2023 0854   BILITOT 0.5 10/22/2023 0854       RADIOGRAPHIC STUDIES: No results found.  ASSESSMENT AND PLAN: This is a very pleasant 73  years old African-American male recently diagnosed with  multiple myeloma, IgG subtype.  He is currently undergoing treatment with Revlimid , Velcade  and Decadron  status post 6 cycles. The patient tolerated his treatment well with no concerning complaints except for mild peripheral neuropathy. The patient has very good response to this treatment. On October 08, 2018 he underwent autologous stem cell transplant at [facility] and tolerated the procedure well  The patient is currently on maintenance treatment with Revlimid  10 mg p.o. daily status post 21  months of treatment.  This was changed to Revlimid  5 mg p.o. daily secondary to neutropenia.   He had repeat myeloma panel performed recently.  It showed no concerning findings for progression. Assessment and Plan Assessment & Plan Multiple myeloma, IgG subtype Diagnosed in January 2020. Status post systemic chemotherapy with Velcade , Revlimid , and Decadron  followed by autologous stem cell transplant. Currently on maintenance therapy with Revlimid  5 mg PO daily. Recent myeloma panel shows no evidence of disease progression. - Continue Revlimid  5 mg PO daily - Repeat myeloma panel at next visit  Chronic diarrhea Occurs approximately once a week, managed with a daily medication. Symptoms are manageable with the current regimen, especially when traveling. - Continue current medication for diarrhea management For the bone disease he has been on treatment with Zometa  every 3 months and he was informed by Dr. Russella and he recommended for him to discontinue his current treatment with Zometa  for now. For the peripheral neuropathy, he will continue his current treatment with gabapentin . He was advised to call immediately if he has any other concerning symptoms in the interval. The patient voices understanding of current disease status and treatment options and is in agreement with the current care plan. All questions were answered. The patient knows to call the clinic with any problems, questions or  concerns. We can certainly see the patient much sooner if necessary.  The total time spent in the appointment was 20 minutes.  Disclaimer: This note was dictated with voice recognition software. Similar sounding words can inadvertently be transcribed and may not be corrected upon review.

## 2023-10-29 NOTE — Telephone Encounter (Signed)
 Scheduled patient appointments. Called and spoke with the patient, he is aware.

## 2023-10-30 ENCOUNTER — Other Ambulatory Visit: Payer: Self-pay | Admitting: Internal Medicine

## 2023-10-30 DIAGNOSIS — C9000 Multiple myeloma not having achieved remission: Secondary | ICD-10-CM

## 2023-10-30 MED ORDER — LENALIDOMIDE 5 MG PO CAPS: 5.0000 mg | ORAL_CAPSULE | Freq: Every day | ORAL | 0 refills | Status: DC

## 2023-11-02 ENCOUNTER — Other Ambulatory Visit: Payer: Self-pay | Admitting: Medical Oncology

## 2023-11-02 DIAGNOSIS — C9000 Multiple myeloma not having achieved remission: Secondary | ICD-10-CM

## 2023-11-02 MED ORDER — LENALIDOMIDE 5 MG PO CAPS: 5.0000 mg | ORAL_CAPSULE | Freq: Every day | ORAL | 0 refills | Status: DC

## 2023-11-19 ENCOUNTER — Other Ambulatory Visit (HOSPITAL_COMMUNITY): Payer: Self-pay

## 2023-11-30 ENCOUNTER — Other Ambulatory Visit: Payer: Self-pay | Admitting: *Deleted

## 2023-12-01 ENCOUNTER — Telehealth: Payer: Self-pay

## 2023-12-01 ENCOUNTER — Other Ambulatory Visit: Payer: Self-pay

## 2023-12-01 DIAGNOSIS — C9000 Multiple myeloma not having achieved remission: Secondary | ICD-10-CM

## 2023-12-01 MED ORDER — LENALIDOMIDE 5 MG PO CAPS: 5.0000 mg | ORAL_CAPSULE | Freq: Every day | ORAL | 0 refills | Status: DC

## 2023-12-01 NOTE — Telephone Encounter (Signed)
 Spoke with Luke at [facility] pharmacy regarding Revlimid  refill. Informed her that when renewing the prescription on the [employer] website, I accidentally entered the incorrect dose. We confirmed that the patient takes Revlimid  5 mg daily for 28 days. Prescription was sent electronically. Kim voiced understanding.

## 2023-12-01 NOTE — Telephone Encounter (Signed)
 Received fax confirmation for prescription refill of Revlimid  to covermymeds @ [phone].

## 2023-12-11 ENCOUNTER — Telehealth: Payer: Self-pay

## 2023-12-11 NOTE — Telephone Encounter (Signed)
 [name] Encounter   Received notification that patient is due for re-enrollment for assistance for Revlimid  through [employer].   Re-enrollment process has been initiated and will be submitted upon completion of necessary documents.  [employer] phone number [phone].   I will continue to follow until final determination.  Lucie Lamer, CPhT [place]  [facility] Oncology Pharmacy Patient Advocate Specialist [facility] [place] Phone: [phone]  Fax: [phone] [email or web address]

## 2023-12-15 NOTE — Telephone Encounter (Signed)
 [name] Encounter  [employer] completed benefits investigation, sending over to Patient Assistance Referral.  Lucie Lamer, CPhT [place]  [facility] Oncology Pharmacy Patient Advocate Specialist [facility] [place] Phone: [phone]  Fax: [phone] [email or web address]

## 2024-01-08 ENCOUNTER — Other Ambulatory Visit: Payer: Self-pay | Admitting: Medical Oncology

## 2024-01-08 DIAGNOSIS — C9000 Multiple myeloma not having achieved remission: Secondary | ICD-10-CM

## 2024-01-08 MED ORDER — LENALIDOMIDE 5 MG PO CAPS: 5.0000 mg | ORAL_CAPSULE | Freq: Every day | ORAL | 0 refills | Status: DC

## 2024-01-08 NOTE — Telephone Encounter (Signed)
 Needs authorization for Revlimid .

## 2024-01-19 NOTE — Telephone Encounter (Signed)
 [name] Encounter  I called to check status, and they have received the application, waiting on renewal process for to start in January  Lucie Lamer, CPhT [place]  [facility] Oncology Pharmacy Patient Advocate Specialist [facility] [place] Phone: [phone]  Fax: [phone] [email or web address]

## 2024-02-10 ENCOUNTER — Other Ambulatory Visit: Payer: Self-pay | Admitting: Medical Oncology

## 2024-02-10 DIAGNOSIS — C9000 Multiple myeloma not having achieved remission: Secondary | ICD-10-CM

## 2024-02-10 MED ORDER — LENALIDOMIDE 5 MG PO CAPS: 5.0000 mg | ORAL_CAPSULE | Freq: Every day | ORAL | 0 refills | Status: AC

## 2024-02-17 NOTE — Telephone Encounter (Signed)
 [name] Encounter  Called to check status, still pending they have it escalated. [employer] PAF: [phone]  Lucie Lamer, CPhT [place]  [facility] Services Oncology Pharmacy Patient Advocate Specialist [facility] [place] Phone: [phone]  Fax: [phone] [email or web address]

## 2024-02-18 NOTE — Telephone Encounter (Signed)
 [name] Encounter  Called this morning and asked if there was any way to get this approved today, and the lady I spoke with said she would message them to push his up to the top since his refill is due ASAP. She is hoping it will be approved today.  Lucie Lamer, CPhT [place]  [facility] Oncology Pharmacy Patient Advocate Specialist [facility] [place] Phone: [phone]  Fax: [phone] [email or web address]

## 2024-02-19 NOTE — Telephone Encounter (Signed)
 [name] Encounter   Received notification re-enrollment for assistance for Revlimid  through [employer]PAF has been approved. Patient may continue to receive their medication at $0 from this program.    [employer] PAF: [phone].   Effective dates: 02/18/24 through 02/02/25  I have spoken to the patient.  Lucie Lamer, CPhT [place]  [facility] Services Oncology Pharmacy Patient Advocate Specialist [facility] [place] Phone: [phone]  Fax: [phone] [email or web address]

## 2024-04-20 ENCOUNTER — Inpatient Hospital Stay

## 2024-04-27 ENCOUNTER — Inpatient Hospital Stay

## 2024-04-27 ENCOUNTER — Inpatient Hospital Stay: Admitting: Internal Medicine
